# Patient Record
Sex: Female | Born: 1945
Health system: Southern US, Community
[De-identification: ages and names within clinical notes are randomized; demographics above are authoritative.]

## PROBLEM LIST (undated history)

## (undated) DIAGNOSIS — Z87442 Personal history of urinary calculi: Secondary | ICD-10-CM

## (undated) DIAGNOSIS — K219 Gastro-esophageal reflux disease without esophagitis: Secondary | ICD-10-CM

## (undated) DIAGNOSIS — I10 Essential (primary) hypertension: Secondary | ICD-10-CM

## (undated) DIAGNOSIS — E669 Obesity, unspecified: Secondary | ICD-10-CM

## (undated) DIAGNOSIS — E785 Hyperlipidemia, unspecified: Secondary | ICD-10-CM

## (undated) DIAGNOSIS — T7840XA Allergy, unspecified, initial encounter: Secondary | ICD-10-CM

## (undated) HISTORY — DX: Obesity, unspecified: E66.9

## (undated) HISTORY — PX: ABDOMINAL HYSTERECTOMY: SHX81

## (undated) HISTORY — PX: LITHOTRIPSY: SUR834

## (undated) HISTORY — DX: Allergy, unspecified, initial encounter: T78.40XA

## (undated) HISTORY — DX: Hyperlipidemia, unspecified: E78.5

## (undated) HISTORY — DX: Essential (primary) hypertension: I10

## (undated) HISTORY — DX: Gastro-esophageal reflux disease without esophagitis: K21.9

---

## 2002-03-31 ENCOUNTER — Emergency Department (HOSPITAL_COMMUNITY): Admission: EM | Admit: 2002-03-31 | Discharge: 2002-03-31 | Payer: Self-pay

## 2006-02-03 ENCOUNTER — Ambulatory Visit: Payer: Self-pay

## 2006-11-03 LAB — CONVERTED CEMR LAB: Pap Smear: NORMAL

## 2007-04-09 ENCOUNTER — Encounter: Payer: Self-pay | Admitting: Family Medicine

## 2007-10-21 ENCOUNTER — Ambulatory Visit: Payer: Self-pay | Admitting: Family Medicine

## 2007-10-21 DIAGNOSIS — K219 Gastro-esophageal reflux disease without esophagitis: Secondary | ICD-10-CM | POA: Insufficient documentation

## 2007-10-21 DIAGNOSIS — E785 Hyperlipidemia, unspecified: Secondary | ICD-10-CM | POA: Insufficient documentation

## 2007-10-21 DIAGNOSIS — M722 Plantar fascial fibromatosis: Secondary | ICD-10-CM

## 2007-10-21 DIAGNOSIS — J4520 Mild intermittent asthma, uncomplicated: Secondary | ICD-10-CM | POA: Insufficient documentation

## 2007-10-21 DIAGNOSIS — M899 Disorder of bone, unspecified: Secondary | ICD-10-CM

## 2007-10-21 DIAGNOSIS — J309 Allergic rhinitis, unspecified: Secondary | ICD-10-CM | POA: Insufficient documentation

## 2007-10-21 DIAGNOSIS — J4531 Mild persistent asthma with (acute) exacerbation: Secondary | ICD-10-CM

## 2007-10-21 DIAGNOSIS — I10 Essential (primary) hypertension: Secondary | ICD-10-CM

## 2007-10-21 DIAGNOSIS — Z87442 Personal history of urinary calculi: Secondary | ICD-10-CM

## 2007-10-21 DIAGNOSIS — M949 Disorder of cartilage, unspecified: Secondary | ICD-10-CM

## 2007-10-25 ENCOUNTER — Encounter: Payer: Self-pay | Admitting: Family Medicine

## 2007-12-16 ENCOUNTER — Ambulatory Visit: Payer: Self-pay | Admitting: Internal Medicine

## 2007-12-28 ENCOUNTER — Ambulatory Visit: Payer: Self-pay | Admitting: Internal Medicine

## 2007-12-28 DIAGNOSIS — K5730 Diverticulosis of large intestine without perforation or abscess without bleeding: Secondary | ICD-10-CM | POA: Insufficient documentation

## 2007-12-31 ENCOUNTER — Telehealth: Payer: Self-pay | Admitting: Family Medicine

## 2008-01-31 ENCOUNTER — Telehealth: Payer: Self-pay | Admitting: Family Medicine

## 2008-02-17 ENCOUNTER — Ambulatory Visit: Payer: Self-pay | Admitting: Family Medicine

## 2008-02-18 LAB — CONVERTED CEMR LAB
Cholesterol: 246 mg/dL (ref 0–200)
Direct LDL: 187.7 mg/dL
HDL: 43.9 mg/dL (ref >39.0)
Total CHOL/HDL Ratio: 5.6
Triglycerides: 92 mg/dL (ref 0–149)
VLDL: 18 mg/dL (ref 0–40)

## 2008-02-28 ENCOUNTER — Encounter: Payer: Self-pay | Admitting: Family Medicine

## 2008-04-20 ENCOUNTER — Telehealth (INDEPENDENT_AMBULATORY_CARE_PROVIDER_SITE_OTHER): Payer: Self-pay | Admitting: *Deleted

## 2008-04-21 ENCOUNTER — Ambulatory Visit: Payer: Self-pay | Admitting: Family Medicine

## 2008-04-21 LAB — CONVERTED CEMR LAB
ALT: 38 units/L — ABNORMAL HIGH (ref 0–35)
AST: 23 units/L (ref 0–37)
Albumin: 4 g/dL (ref 3.5–5.2)
Alkaline Phosphatase: 39 units/L (ref 39–117)
BUN: 17 mg/dL (ref 6–23)
Bilirubin, Direct: 0.1 mg/dL (ref 0.0–0.3)
CO2: 27 meq/L (ref 19–32)
Calcium: 9.3 mg/dL (ref 8.4–10.5)
Chloride: 108 meq/L (ref 96–112)
Cholesterol: 180 mg/dL (ref 0–200)
Creatinine, Ser: 0.8 mg/dL (ref 0.4–1.2)
GFR calc Af Amer: 94 mL/min
GFR calc non Af Amer: 78 mL/min
Glucose, Bld: 102 mg/dL — ABNORMAL HIGH (ref 70–99)
HDL: 43.8 mg/dL (ref >39.0)
LDL Cholesterol: 120 mg/dL — ABNORMAL HIGH (ref 0–99)
Potassium: 4.2 meq/L (ref 3.5–5.1)
Sodium: 142 meq/L (ref 135–145)
Total Bilirubin: 0.9 mg/dL (ref 0.3–1.2)
Total CHOL/HDL Ratio: 4.1
Total Protein: 6.7 g/dL (ref 6.0–8.3)
Triglycerides: 83 mg/dL (ref 0–149)
VLDL: 17 mg/dL (ref 0–40)

## 2008-04-24 ENCOUNTER — Ambulatory Visit: Payer: Self-pay | Admitting: Family Medicine

## 2008-06-22 ENCOUNTER — Ambulatory Visit: Payer: Self-pay | Admitting: Family Medicine

## 2008-06-22 DIAGNOSIS — IMO0001 Reserved for inherently not codable concepts without codable children: Secondary | ICD-10-CM

## 2008-07-11 ENCOUNTER — Ambulatory Visit: Payer: Self-pay | Admitting: Family Medicine

## 2008-08-01 ENCOUNTER — Ambulatory Visit: Payer: Self-pay | Admitting: Family Medicine

## 2008-08-01 DIAGNOSIS — R109 Unspecified abdominal pain: Secondary | ICD-10-CM | POA: Insufficient documentation

## 2008-08-01 DIAGNOSIS — L658 Other specified nonscarring hair loss: Secondary | ICD-10-CM | POA: Insufficient documentation

## 2008-08-03 ENCOUNTER — Encounter: Payer: Self-pay | Admitting: Family Medicine

## 2008-08-07 ENCOUNTER — Encounter (INDEPENDENT_AMBULATORY_CARE_PROVIDER_SITE_OTHER): Payer: Self-pay | Admitting: *Deleted

## 2008-08-07 LAB — CONVERTED CEMR LAB
Free T4: 1.3 ng/dL (ref 0.6–1.6)
T4, Total: 7.4 ug/dL (ref 5.0–12.5)
TSH: 0.69 microintl units/mL (ref 0.35–5.50)

## 2008-08-31 ENCOUNTER — Ambulatory Visit: Payer: Self-pay | Admitting: Family Medicine

## 2008-09-04 ENCOUNTER — Encounter: Payer: Self-pay | Admitting: Family Medicine

## 2008-09-05 ENCOUNTER — Encounter: Payer: Self-pay | Admitting: Family Medicine

## 2008-09-27 ENCOUNTER — Encounter (INDEPENDENT_AMBULATORY_CARE_PROVIDER_SITE_OTHER): Payer: Self-pay | Admitting: *Deleted

## 2008-10-17 ENCOUNTER — Ambulatory Visit: Payer: Self-pay | Admitting: Family Medicine

## 2008-10-17 LAB — CONVERTED CEMR LAB
Cholesterol: 213 mg/dL — ABNORMAL HIGH (ref 0–200)
Direct LDL: 148.7 mg/dL
HDL: 43 mg/dL (ref >39.00)
Total CHOL/HDL Ratio: 5
Triglycerides: 145 mg/dL (ref 0.0–149.0)
VLDL: 29 mg/dL (ref 0.0–40.0)

## 2008-10-19 ENCOUNTER — Ambulatory Visit: Payer: Self-pay | Admitting: Family Medicine

## 2009-04-04 LAB — CONVERTED CEMR LAB: Pap Smear: NORMAL

## 2009-05-31 ENCOUNTER — Encounter: Payer: Self-pay | Admitting: Family Medicine

## 2009-06-11 ENCOUNTER — Ambulatory Visit: Payer: Self-pay | Admitting: Family Medicine

## 2009-06-11 LAB — CONVERTED CEMR LAB
ALT: 19 units/L (ref 0–35)
AST: 19 units/L (ref 0–37)
Albumin: 4.1 g/dL (ref 3.5–5.2)
Alkaline Phosphatase: 53 units/L (ref 39–117)
Bilirubin, Direct: 0.1 mg/dL (ref 0.0–0.3)
Cholesterol: 242 mg/dL — ABNORMAL HIGH (ref 0–200)
Direct LDL: 181 mg/dL
HDL: 51.1 mg/dL (ref >39.00)
Total Bilirubin: 0.8 mg/dL (ref 0.3–1.2)
Total CHOL/HDL Ratio: 5
Total Protein: 6.6 g/dL (ref 6.0–8.3)
Triglycerides: 138 mg/dL (ref 0.0–149.0)
VLDL: 27.6 mg/dL (ref 0.0–40.0)

## 2009-06-12 ENCOUNTER — Ambulatory Visit: Payer: Self-pay | Admitting: Family Medicine

## 2009-06-12 DIAGNOSIS — D751 Secondary polycythemia: Secondary | ICD-10-CM

## 2009-06-12 LAB — CONVERTED CEMR LAB
Cholesterol, target level: 200 mg/dL
Ferritin: 132.9 ng/mL (ref 10.0–291.0)
HDL goal, serum: 40 mg/dL
LDL Goal: 130 mg/dL

## 2009-06-13 LAB — CONVERTED CEMR LAB
Basophils Absolute: 0.1 10*3/uL (ref 0.0–0.1)
Eosinophils Relative: 1.8 % (ref 0.0–5.0)
HCT: 44.4 % (ref 36.0–46.0)
Hemoglobin: 15 g/dL (ref 12.0–15.0)
Lymphs Abs: 3.6 10*3/uL (ref 0.7–4.0)
MCV: 85.8 fL (ref 78.0–100.0)
Monocytes Absolute: 0.8 10*3/uL (ref 0.1–1.0)
Monocytes Relative: 8.7 % (ref 3.0–12.0)
Neutro Abs: 4 10*3/uL (ref 1.4–7.7)
Platelets: 332 10*3/uL (ref 150.0–400.0)
RDW: 12.5 % (ref 11.5–14.6)

## 2009-06-20 ENCOUNTER — Ambulatory Visit: Payer: Self-pay | Admitting: Family Medicine

## 2009-06-20 LAB — CONVERTED CEMR LAB
Specific Gravity, Urine: 1.01
pH: 6

## 2009-06-21 ENCOUNTER — Encounter (INDEPENDENT_AMBULATORY_CARE_PROVIDER_SITE_OTHER): Payer: Self-pay | Admitting: Internal Medicine

## 2009-12-20 ENCOUNTER — Ambulatory Visit: Payer: Self-pay | Admitting: Orthopedic Surgery

## 2010-05-27 ENCOUNTER — Telehealth (INDEPENDENT_AMBULATORY_CARE_PROVIDER_SITE_OTHER): Payer: Self-pay | Admitting: *Deleted

## 2010-05-29 ENCOUNTER — Ambulatory Visit: Payer: Self-pay | Admitting: Family Medicine

## 2010-05-29 DIAGNOSIS — R5381 Other malaise: Secondary | ICD-10-CM | POA: Insufficient documentation

## 2010-05-29 DIAGNOSIS — R5383 Other fatigue: Secondary | ICD-10-CM

## 2010-05-29 LAB — CONVERTED CEMR LAB
ALT: 21 units/L (ref 0–35)
AST: 19 units/L (ref 0–37)
Alkaline Phosphatase: 55 units/L (ref 39–117)
Basophils Relative: 0.8 % (ref 0.0–3.0)
Bilirubin, Direct: 0.1 mg/dL (ref 0.0–0.3)
CO2: 25 meq/L (ref 19–32)
Chloride: 100 meq/L (ref 96–112)
Eosinophils Relative: 1 % (ref 0.0–5.0)
HCT: 45.9 % (ref 36.0–46.0)
Hemoglobin: 15.2 g/dL — ABNORMAL HIGH (ref 12.0–15.0)
Lymphs Abs: 3.7 10*3/uL (ref 0.7–4.0)
Monocytes Relative: 7.4 % (ref 3.0–12.0)
Platelets: 331 10*3/uL (ref 150.0–400.0)
Potassium: 4.3 meq/L (ref 3.5–5.1)
RBC: 5.38 M/uL — ABNORMAL HIGH (ref 3.87–5.11)
TSH: 0.79 microintl units/mL (ref 0.35–5.50)
Total Bilirubin: 0.5 mg/dL (ref 0.3–1.2)
Total CHOL/HDL Ratio: 5
Triglycerides: 138 mg/dL (ref 0.0–149.0)
WBC: 9.7 10*3/uL (ref 4.5–10.5)

## 2010-06-05 ENCOUNTER — Ambulatory Visit: Payer: Self-pay | Admitting: Family Medicine

## 2010-07-09 ENCOUNTER — Telehealth: Payer: Self-pay | Admitting: Family Medicine

## 2010-07-12 ENCOUNTER — Telehealth: Payer: Self-pay | Admitting: Family Medicine

## 2010-07-31 ENCOUNTER — Encounter (INDEPENDENT_AMBULATORY_CARE_PROVIDER_SITE_OTHER): Payer: Self-pay | Admitting: *Deleted

## 2010-07-31 ENCOUNTER — Ambulatory Visit: Payer: Self-pay | Admitting: Family Medicine

## 2010-08-06 ENCOUNTER — Ambulatory Visit
Admission: RE | Admit: 2010-08-06 | Discharge: 2010-08-06 | Payer: Self-pay | Source: Home / Self Care | Attending: Internal Medicine | Admitting: Internal Medicine

## 2010-08-08 ENCOUNTER — Ambulatory Visit: Admit: 2010-08-08 | Payer: Self-pay | Admitting: Family Medicine

## 2010-09-03 NOTE — Progress Notes (Signed)
----   Converted from flag ---- ---- 05/26/2010 3:46 PM, Hannah Beat MD wrote: Prephysical Labs, several days before, fasting BMP, HFP, FLP, CBC with diff, TSH: 272.4, v77.1, ,780.79  ---- 05/23/2010 9:56 AM, Liane Comber CMA (AAMA) wrote: Lab orders please! Good Morning! This pt is scheduled for cpx labs Wed, which labs to draw and dx codes to use? Thanks Tasha ------------------------------

## 2010-09-03 NOTE — Progress Notes (Signed)
Summary: finasteride  Phone Note Refill Request Message from:  Fax from Pharmacy on July 09, 2010 1:08 PM  Refills Requested: Medication #1:  FINASTERIDE 5 MG TABS 1/4 tab by mouth daily   Last Refilled: 06/05/2010 Refill request from [facility]. [phone].   Initial call taken by: Melody Comas,  July 09, 2010 1:08 PM  Follow-up for Phone Call        Rx faxed to pharmacy Follow-up by: Benny Lennert CMA Duncan Dull),  July 09, 2010 1:30 PM    Prescriptions: FINASTERIDE 5 MG TABS (FINASTERIDE) 1/4 tab by mouth daily  #90 x 2   Entered by:   Benny Lennert CMA (AAMA)   Authorized by:   Kerby Nora MD   Signed by:   Benny Lennert CMA (AAMA) on 07/09/2010   Method used:   Faxed to ...       [facility] (mail-order)             , [place]         Ph: [phone]       Fax: [phone]   RxID:   [id number]

## 2010-09-03 NOTE — Assessment & Plan Note (Signed)
Summary: CPX/JRR   Vital Signs:  Patient profile:   65 year old female Height:      61.5 inches Weight:      170.0 pounds BMI:     31.72 Temp:     98.6 degrees F oral Pulse rate:   76 / minute Pulse rhythm:   regular BP sitting:   140 / 98  (left arm) Cuff size:   regular  Vitals Entered By: Benny Lennert CMA Duncan Dull) (June 05, 2010 2:39 PM)  History of Present Illness: Chief complaint cpx  65 year old for CPX:    Hypertension History:      She denies headache, chest pain, palpitations, dyspnea with exertion, orthopnea, PND, peripheral edema, visual symptoms, neurologic problems, syncope, and side effects from treatment.        Positive major cardiovascular risk factors include female age 88 years old or older, hyperlipidemia, and hypertension.  Negative major cardiovascular risk factors include no history of diabetes and non-tobacco-user status.        Further assessment for target organ damage reveals no history of ASHD, stroke/TIA, or peripheral vascular disease.    Lipid Management History:      Positive NCEP/ATP III risk factors include female age 96 years old or older and hypertension.  Negative NCEP/ATP III risk factors include no history of early menopause without estrogen hormone replacement, non-diabetic, non-tobacco-user status, no ASHD (atherosclerotic heart disease), no prior stroke/TIA, no peripheral vascular disease, and no history of aortic aneurysm.        Her compliance with the TLC diet is fair.    Preventive Screening-Counseling & Management  Alcohol-Tobacco     Alcohol Counseling: not indicated; use of alcohol is not excessive or problematic     Smoking Status: never  Caffeine-Diet-Exercise     Does Patient Exercise: yes     Type of exercise: walking     Exercise (avg: min/session): 30-60     Times/week: 4  Hep-HIV-STD-Contraception     HIV Risk: no risk noted     STD Risk: no risk noted     Contraception Counseling: not indicated; no  questions/concerns expressed     SBE Education/Counseling: to perform regular SBE      Sexual History:  currently monogamous.        Drug Use:  never.    Allergies: 1)  ! Codeine  Past History:  Past medical, surgical, family and social histories (including risk factors) reviewed, and no changes noted (except as noted below).  Past Medical History: Reviewed history from 10/21/2007 and no changes required. Asthma, mild intermittant GERD Allergic rhinitis Hypertension Hyperlipidemia  Past Surgical History: Reviewed history from 10/21/2007 and no changes required. lithotripsy, 2 x 1990s 1979 hysterectomy, vaginal partial for fibriods and mennorhagia  Family History: Reviewed history from 10/21/2007 and no changes required. father: aciidental death mother: CAD dx age 1, DM siblings: HTN, high chol, brother with CAD, MI age 28 ? grandparent history  Social History: Reviewed history from 10/21/2007 and no changes required. Occupation:  works at [employer], Production manager Married 2 children, healthy Never Smoked Alcohol use-yes, seldom Drug use-no Regular exercise-yes, walks 3-4 days a week Diet: no fast food, instead veggies, loves baking  HIV Risk:  no risk noted STD Risk:  no risk noted Sexual History:  currently monogamous Drug Use:  never  Review of Systems  General: Denies fever, chills, sweats, anorexia, fatigue, weakness, malaise Eyes: Denies blurring, vision loss ENT: Denies earache, nasal congestion, nosebleeds, sore throat, and  hoarseness.  Cardiovascular: Denies chest pains, palpitations, syncope, dyspnea on exertion,  Respiratory: Denies cough, dyspnea at rest, excessive sputum,wheeezing GI: Denies nausea, vomiting, diarrhea, constipation, change in bowel habits, abdominal pain, melena, hematochezia GU: Denies dysuria, hematuria, discharge, urinary frequency, urinary hesitancy, nocturia, incontinence, genital sores, decreased libido Musculoskeletal: Denies back pain,  joint pain Derm: Denies rash, itching Neuro: Denies  paresthesias, frequent falls, frequent headaches, and difficulty walking.  Psych: Denies depression, anxiety Endocrine: Denies cold intolerance, heat intolerance, polydipsia, polyphagia, polyuria, and unusual weight change.  Heme: Denies enlarged lymph nodes Allergy: No hayfever   Otherwise, the pertinent positives and negatives are listed above and in the HPI, otherwise a full review of systems has been reviewed and is negative unless noted positive.    Impression & Recommendations:  Problem # 1:  HEALTH MAINTENANCE EXAM (ICD-V70.0) The patient's preventative maintenance and recommended screening tests for an annual wellness exam were reviewed in full today. Brought up to date unless services declined.  Counselled on the importance of diet, exercise, and its role in overall health and mortality. The patient's FH and SH was reviewed, including their home life, tobacco status, and drug and alcohol status.   Problem # 2:  HYPERTENSION (ICD-401.9) Assessment: Deteriorated  Her updated medication list for this problem includes:    Hydrochlorothiazide 12.5 Mg Tabs (Hydrochlorothiazide) .Marland Kitchen... 1 by mouth daily  Problem # 3:  HYPERLIPIDEMIA (ICD-272.4)  Her updated medication list for this problem includes:    Simvastatin 40 Mg Tabs (Simvastatin) .Marland Kitchen... Take one tablet at bedtime  Complete Medication List: 1)  Multivitamins Tabs (Multiple vitamin) .... Take 1 tablet by mouth once a day 2)  Viactiv 500-100-40 Chew (Calcium-vitamin d-vitamin k) .... Chew 1 each day 3)  Albuterol 90 Mcg/act Aers (Albuterol) .... As needed 4)  Fluticasone Propionate 50 Mcg/act Susp (Fluticasone propionate) .... 2 sprays per nostril daily (90 day supply) as needed 5)  Finasteride 5 Mg Tabs (Finasteride) .... 1/4 tab by mouth daily 6)  Fish Oil 300 Mg Caps (Omega-3 fatty acids) .... Daily 7)  Flax Seed Oil 1000 Mg Caps (Flaxseed (linseed)) .... Once  daily 8)  Biotin 5 Mg Caps (Biotin) .... Once daily 9)  Vivelle-dot 0.075 Mg/24hr Pttw (Estradiol) .... Once daily 10)  Azo Standard Maximum Strength 97.5 Mg Tabs (Phenazopyridine hcl) .... Otc as directed. 11)  Hydrochlorothiazide 12.5 Mg Tabs (Hydrochlorothiazide) .Marland Kitchen.. 1 by mouth daily 12)  Simvastatin 40 Mg Tabs (Simvastatin) .... Take one tablet at bedtime  Other Orders: Admin 1st Vaccine ([address]) Flu Vaccine 23yrs + ([address])  Hypertension Assessment/Plan:      The patient's hypertensive risk group is category B: At least one risk factor (excluding diabetes) with no target organ damage.  Her calculated 10 year risk of coronary heart disease is 13 %.  Today's blood pressure is 140/98.    Lipid Assessment/Plan:      Based on NCEP/ATP III, the patient's risk factor category is "2 or more risk factors and a calculated 10 year CAD risk of < 20%".  From this information, the patient's calculated lipid goals are as follows: Total cholesterol goal is 200; LDL cholesterol goal is 130; HDL cholesterol goal is 40; Triglyceride goal is 150.  Her LDL cholesterol goal has not been met.    Patient Instructions: 1)  recheck 3 mo: 2)  FLP, BMP, HFP: 272.4, v58.69 3)  sev days prior to ov Prescriptions: FINASTERIDE 5 MG TABS (FINASTERIDE) 1/4 tab by mouth daily  #30 x 3  Entered and Authorized by:   Hannah Beat MD   Signed by:   Hannah Beat MD on 06/05/2010   Method used:   Print then Give to Patient   RxID:   [id number] SIMVASTATIN 40 MG TABS (SIMVASTATIN) Take one tablet at bedtime  #90 x 3   Entered and Authorized by:   Hannah Beat MD   Signed by:   Hannah Beat MD on 06/05/2010   Method used:   Print then Give to Patient   RxID:   [id number] HYDROCHLOROTHIAZIDE 12.5 MG TABS (HYDROCHLOROTHIAZIDE) 1 by mouth daily  #90 x 3   Entered and Authorized by:   Hannah Beat MD   Signed by:   Hannah Beat MD on 06/05/2010   Method used:   Print then Give to Patient    RxID:   [id number] SIMVASTATIN 40 MG TABS (SIMVASTATIN) Take one tablet at bedtime  #30 x 11   Entered and Authorized by:   Hannah Beat MD   Signed by:   Hannah Beat MD on 06/05/2010   Method used:   Print then Give to Patient   RxID:   [id number] HYDROCHLOROTHIAZIDE 12.5 MG TABS (HYDROCHLOROTHIAZIDE) 1 by mouth daily  #30 x 11   Entered and Authorized by:   Hannah Beat MD   Signed by:   Hannah Beat MD on 06/05/2010   Method used:   Print then Give to Patient   RxID:   [id number] FINASTERIDE 5 MG TABS (FINASTERIDE) 1/4 tab by mouth daily  #8 x 11   Entered and Authorized by:   Hannah Beat MD   Signed by:   Hannah Beat MD on 06/05/2010   Method used:   Print then Give to Patient   RxID:   [id number]    Orders Added: 1)  Admin 1st Vaccine [90471] 2)  Flu Vaccine 29yrs + [09323] 3)  Est. Patient 40-64 years [55732]    Current Allergies (reviewed today): ! CODEINE      Flu Vaccine Consent Questions     Do you have a history of severe allergic reactions to this vaccine? no    Any prior history of allergic reactions to egg and/or gelatin? no    Do you have a sensitivity to the preservative Thimersol? no    Do you have a past history of Guillan-Barre Syndrome? no    Do you currently have an acute febrile illness? no    Have you ever had a severe reaction to latex? no    Vaccine information given and explained to patient? yes    Are you currently pregnant? no    Lot Number:[id number]   Exp Date:02/01/2011   Site Given  Left Deltoid IM   .lbflu    Prevention & Chronic Care Immunizations   Influenza vaccine: Fluvax 3+  (06/05/2010)   Influenza vaccine due: 06/12/2010    Tetanus booster: Not documented    Pneumococcal vaccine: Not documented    H. zoster vaccine: 07/11/2008: Zostavax  Colorectal Screening   Hemoccult: Not documented    Colonoscopy: Location:  [facility].    (12/28/2007)   Colonoscopy  due: 01/2018  Other Screening   Pap smear: normal  (04/04/2009)   Pap smear action/deferral: GYN referral  (06/05/2010)   Pap smear due: 03/05/2011    Mammogram: normal  (04/04/2009)   Mammogram action/deferral: Ordered  (06/05/2010)   Mammogram due: 03/05/2011    DXA bone density scan: Not documented   Smoking status: never  (06/05/2010)  Lipids  Total Cholesterol: 242  (05/29/2010)   LDL: 120  (04/21/2008)   LDL Direct: 162.6  (05/29/2010)   HDL: 50.60  (05/29/2010)   Triglycerides: 138.0  (05/29/2010)    SGOT (AST): 19  (05/29/2010)   SGPT (ALT): 21  (05/29/2010)   Alkaline phosphatase: 55  (05/29/2010)   Total bilirubin: 0.5  (05/29/2010)    Lipid flowsheet reviewed?: Yes   Progress toward LDL goal: Deteriorated  Hypertension   Last Blood Pressure: 140 / 98  (06/05/2010)   Serum creatinine: 0.7  (05/29/2010)   Serum potassium 4.3  (05/29/2010)    Hypertension flowsheet reviewed?: Yes   Progress toward BP goal: Deteriorated  Self-Management Support :    Hypertension self-management support: Not documented    Lipid self-management support: Not documented

## 2010-09-05 NOTE — Letter (Signed)
Summary: [place] No Show Letter  [place] [facility]  [address] [place], [place] [address]   Phone: [phone]  Fax: [phone]    07/31/2010 MRN: [id number]  Dana Hayes [address] [place], [place]  [address]   Dear Ms. Madej,   Our records indicate that you missed your scheduled appointment with ___lab_________________ on _12.28.2011__________.  Please contact this office to reschedule your appointment as soon as possible.  It is important that you keep your scheduled appointments with your physician, so we can provide you the best care possible.  Please be advised that there may be a charge for "no show" appointments.    Sincerely,   [place] [facility]

## 2010-09-05 NOTE — Assessment & Plan Note (Signed)
Summary: SINUS SYMPTOMS/ 2:15   Vital Signs:  Patient profile:   65 year old female Weight:      173.50 pounds Temp:     98.5 degrees F oral Pulse rate:   64 / minute Pulse rhythm:   regular BP sitting:   138 / 82  (left arm) Cuff size:   large  Vitals Entered By: Selena Batten Dance CMA (AAMA) (August 06, 2010 2:14 PM) CC: Sinus symptoms/check skin on abd.   History of Present Illness: CC: sinus/cold/cough  3d h/o pressure in head, started with cold sxs (clear runny nose, itchy eyes)  zyrtec didn't help.  Progressed to congestion now with worse cough.  + fatigue.  also with bad HA and pressure in ears.  taking tylenol am and pm, using ventolin inhaler.  mild ST and dry throat.  + myalgias.  has felt hot.    + haarseness but now better.    No abd pain, n/v/d, fevers/chills.  No purulent nasal discharge.  No smokers at home.  h/o mild asthma.  + grandchildren all sick with colds.  on vivelle, rash around site.  Current Medications (verified): 1)  Multivitamins   Tabs (Multiple Vitamin) .... Take 1 Tablet By Mouth Once A Day 2)  Viactiv 500-100-40  Chew (Calcium-Vitamin D-Vitamin K) .... Chew 1 Each Day 3)  Albuterol 90 Mcg/act  Aers (Albuterol) .... As Needed 4)  Fluticasone Propionate 50 Mcg/act Susp (Fluticasone Propionate) .... 2 Sprays Per Nostril Daily (90 Day Supply) As Needed 5)  Finasteride 5 Mg Tabs (Finasteride) .... 1/4 Tab By Mouth Daily 6)  Fish Oil 300 Mg Caps (Omega-3 Fatty Acids) .... Daily 7)  Flax Seed Oil 1000 Mg Caps (Flaxseed (Linseed)) .... Once Daily 8)  Biotin 5 Mg Caps (Biotin) .... Once Daily 9)  Vivelle-Dot 0.075 Mg/24hr Pttw (Estradiol) .... Once Daily 10)  Azo Standard Maximum Strength 97.5 Mg Tabs (Phenazopyridine Hcl) .... Otc As Directed. 11)  Hydrochlorothiazide 12.5 Mg Tabs (Hydrochlorothiazide) .Marland Kitchen.. 1 By Mouth Daily 12)  Simvastatin 40 Mg Tabs (Simvastatin) .... Take One Tablet At Bedtime  Allergies: 1)  ! 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 nontoxic Head:  Normocephalic and atraumatic without obvious abnormalities. No apparent alopecia or balding.  no sinus tenderness Eyes:  PERRLA, EOMI Ears:  TMs clear bilaterally Nose:  nares clear Mouth:  MMM, no pharyngeal erythema/exudates Neck:  No deformities, masses, or tenderness noted.  no LAD Lungs:  Normal respiratory effort, chest expands symmetrically. Lungs are clear to auscultation, no crackles or wheezes. Heart:  Normal rate and regular rhythm. S1 and S2 normal without gallop, murmur, click, rub or other extra sounds. Pulses:  2+ rad pulses, brisk cap refill Extremities:  no pedal edema Skin:  erythematous papulopustular rash over sites of previous rashes.   Impression & Recommendations:  Problem # 1:  URI (ICD-465.9) Assessment New supportive care.  red flags to return discussed. lungs clear, no asthma flare.  Problem # 2:  SKIN RASH, ALLERGIC (ICD-692.9) Assessment: New looks like  allergic reaction to vivelle.  advised to stop and check with PCP for other options.  Complete Medication List: 1)  Multivitamins Tabs (Multiple vitamin) .... Take 1 tablet by mouth once a day 2)  Viactiv 500-100-40 Chew (Calcium-vitamin d-vitamin k) .... Chew 1 each day 3)  Albuterol 90 Mcg/act Aers (Albuterol) .... As needed 4)  Fluticasone Propionate 50 Mcg/act Susp (Fluticasone propionate) .... 2 sprays per nostril daily (90  day supply) as needed 5)  Finasteride 5 Mg Tabs (Finasteride) .... 1/4 tab by mouth daily 6)  Fish Oil 300 Mg Caps (Omega-3 fatty acids) .... Daily 7)  Flax Seed Oil 1000 Mg Caps (Flaxseed (linseed)) .... Once daily 8)  Biotin 5 Mg Caps (Biotin) .... Once daily 9)  Azo Standard Maximum Strength 97.5 Mg Tabs (Phenazopyridine hcl) .... Otc as directed. 10)  Hydrochlorothiazide 12.5 Mg Tabs (Hydrochlorothiazide) .Marland Kitchen.. 1 by mouth daily 11)  Simvastatin 40 Mg Tabs (Simvastatin) .... Take one tablet at bedtime  Patient Instructions: 1)  Sounds like you have a viral upper respiratory infection. 2)  Antibiotics are not needed for this.  Viral infections usually take 7-10 days to resolve.  The cough can last 4 weeks to go away. 3)  use delsym.  use nasal saline spray. 4)  Simple mucinex to mobilize mucous out wiht plenty of water. 5)  Push fluids and plenty of rest. 6)  If not better by end of week, give [place] a call. 7)  Please return if you are not improving as expected, or if you have high fevers (>101.5) or difficulty swallowing. 8)  Call clinic with questions.  Pleasure to see you today!    Orders Added: 1)  Est. Patient Level III [91478]    Current Allergies (reviewed today): ! CODEINE * VIVELLE

## 2010-09-05 NOTE — Progress Notes (Signed)
Summary: regarding finasteride  Phone Note From Pharmacy   Caller: [facility]  Reference [id number] Request: Speak with Nurse Summary of Call: Pharmacist states finasteride will not be covered by [facility] for alopecia.  They are saying you can change to propecia at a higher mg and it will be.  I called the pt to see where she has been getting the finasteride filled and how she has been paying for it.  LMOM for her to call me back.           Lowella Petties CMA, AAMA  July 12, 2010 11:55 AM Spoke with pt and advised her that insurance will not cover her dose of finasteride for her diagnosis.  I asked her to check with her insurance and find out what is going on.  She says she has been getting this filled at [facility] and her insurance has been paying. Initial call taken by: Lowella Petties CMA, AAMA,  July 17, 2010 2:33 PM  Follow-up for Phone Call        Pt states she will keep this script at [facility] and will pay for it if necessary. Follow-up by: Lowella Petties CMA, AAMA,  July 19, 2010 12:47 PM

## 2010-11-07 ENCOUNTER — Other Ambulatory Visit: Payer: Self-pay

## 2010-11-07 NOTE — Telephone Encounter (Signed)
Received faxed refill request from [facility] for Finasteride. Pt received rx for 1 yr in 06/2010. I called pt and pt said to disregard the fax pt is getting med at another pharmacy.

## 2010-11-15 ENCOUNTER — Other Ambulatory Visit: Payer: Self-pay | Admitting: *Deleted

## 2010-11-15 MED ORDER — FINASTERIDE 5 MG PO TABS: 5.0000 mg | ORAL_TABLET | Freq: Every day | ORAL | Status: DC

## 2011-01-06 ENCOUNTER — Ambulatory Visit: Payer: Self-pay | Admitting: Internal Medicine

## 2011-01-06 ENCOUNTER — Encounter: Payer: Self-pay | Admitting: Family Medicine

## 2011-01-06 ENCOUNTER — Ambulatory Visit (INDEPENDENT_AMBULATORY_CARE_PROVIDER_SITE_OTHER): Payer: 59 | Admitting: Family Medicine

## 2011-01-06 VITALS — BP 140/82 | HR 92 | Temp 98.0°F | Ht 61.5 in | Wt 168.8 lb

## 2011-01-06 DIAGNOSIS — N390 Urinary tract infection, site not specified: Secondary | ICD-10-CM

## 2011-01-06 DIAGNOSIS — R300 Dysuria: Secondary | ICD-10-CM

## 2011-01-06 LAB — POCT URINALYSIS DIPSTICK
Bilirubin, UA: NEGATIVE
Blood, UA: NEGATIVE
Glucose, UA: NEGATIVE
Ketones, UA: NEGATIVE
Nitrite, UA: NEGATIVE
Spec Grav, UA: 1.01

## 2011-01-06 MED ORDER — NITROFURANTOIN MONOHYD MACRO 100 MG PO CAPS: 100.0000 mg | ORAL_CAPSULE | Freq: Two times a day (BID) | ORAL | Status: AC

## 2011-01-06 NOTE — Progress Notes (Signed)
65 year old female:  Has been having some symptoms since the 14th. Has been taking some AZO.  Sometimes will hurt with urination. And ibuprofen. Has some urgency.   Patient presents with burning, urgency. No vaginal discharge or external irritation.  No abd pain, no flank pain.  ROS: GEN:  no fevers, chills. GI: No n/v/d, eating normally Otherwise, ROS is as per the HPI.  PHYSICAL EXAM  Blood pressure 140/82, pulse 92, temperature 98 F (36.7 C), temperature source Oral, height 5' 1.5" (1.562 m), weight 168 lb 12.8 oz (76.567 kg), SpO2 96.00%.  GEN: WDWN, A&Ox4,NAD. Non-toxic HEENT: Atraumatc, normocephalic. CV: RRR, No M/G/R PULM: CTA B, No wheezes, crackles, or rhonchi ABD: S, NT, ND, +BS, no rebound. No CVAT. + suprapubic tenderness. EXT: No c/c/e  A/P: UTI. Rx with ABX as below

## 2011-01-08 LAB — URINE CULTURE

## 2011-01-09 ENCOUNTER — Telehealth: Payer: Self-pay

## 2011-01-09 NOTE — Telephone Encounter (Signed)
Left v/m on pt's home and work # to call back.

## 2011-01-09 NOTE — Telephone Encounter (Signed)
Message copied by Patience Musca on Thu Jan 09, 2011  2:23 PM ------      Message from: Hannah Beat      Created: Wed Jan 08, 2011  5:09 PM       Call            See if she is feeling better, would still finish all ABX      Urine looks sterile - bottom line, let me know if she still feels the same symptoms next week       Could be something else, not urine infection

## 2011-01-09 NOTE — Telephone Encounter (Signed)
Patient notified as instructed by telephone.  Pt states she feels better but if not taking Azo still has slight burning when urinates and urgency feeling. Pt will finish antibiotic and pt will call back next week with update of symptoms.

## 2011-01-09 NOTE — Telephone Encounter (Signed)
Message copied by Patience Musca on Thu Jan 09, 2011  1:55 PM ------      Message from: Hannah Beat      Created: Wed Jan 08, 2011  5:09 PM       Call            See if she is feeling better, would still finish all ABX      Urine looks sterile - bottom line, let me know if she still feels the same symptoms next week       Could be something else, not urine infection

## 2011-01-13 ENCOUNTER — Telehealth: Payer: Self-pay | Admitting: *Deleted

## 2011-01-13 DIAGNOSIS — R300 Dysuria: Secondary | ICD-10-CM

## 2011-01-13 NOTE — Telephone Encounter (Signed)
Patient says that if you do want to do referral to urologist she would like to see Dr. Artis Flock.

## 2011-01-13 NOTE — Telephone Encounter (Signed)
THat is what I would suggest -- I am not sure why she is still having symptoms

## 2011-01-13 NOTE — Telephone Encounter (Signed)
Patient says that she has been taking the antibiotic, but is still having all of the same symptoms as last week and they haven't improved at all. She is asking if she should be referred to a urologist or what you suggest at this point. Please advise.

## 2011-01-13 NOTE — Telephone Encounter (Signed)
Patient notified, will wait to hear from [facility].

## 2011-01-14 NOTE — Telephone Encounter (Signed)
Appt made with Dr Artis Flock on 01/15/2011. MK

## 2011-06-02 ENCOUNTER — Other Ambulatory Visit: Payer: 59

## 2011-06-03 ENCOUNTER — Other Ambulatory Visit (INDEPENDENT_AMBULATORY_CARE_PROVIDER_SITE_OTHER): Payer: 59

## 2011-06-03 DIAGNOSIS — Z79899 Other long term (current) drug therapy: Secondary | ICD-10-CM

## 2011-06-03 DIAGNOSIS — R5383 Other fatigue: Secondary | ICD-10-CM

## 2011-06-03 DIAGNOSIS — E785 Hyperlipidemia, unspecified: Secondary | ICD-10-CM

## 2011-06-03 DIAGNOSIS — R5381 Other malaise: Secondary | ICD-10-CM

## 2011-06-03 LAB — CBC WITH DIFFERENTIAL/PLATELET
Basophils Relative: 0.8 % (ref 0.0–3.0)
Eosinophils Absolute: 0.1 10*3/uL (ref 0.0–0.7)
Eosinophils Relative: 1.8 % (ref 0.0–5.0)
HCT: 44.8 % (ref 36.0–46.0)
Lymphs Abs: 3.2 10*3/uL (ref 0.7–4.0)
MCHC: 33 g/dL (ref 30.0–36.0)
MCV: 84.7 fl (ref 78.0–100.0)
Monocytes Absolute: 0.6 10*3/uL (ref 0.1–1.0)
Neutrophils Relative %: 51.8 % (ref 43.0–77.0)
Platelets: 360 10*3/uL (ref 150.0–400.0)

## 2011-06-03 LAB — BASIC METABOLIC PANEL
BUN: 19 mg/dL (ref 6–23)
Chloride: 106 mEq/L (ref 96–112)
GFR: 86.82 mL/min (ref >60.00)
Potassium: 4.6 mEq/L (ref 3.5–5.1)
Sodium: 140 mEq/L (ref 135–145)

## 2011-06-03 LAB — HEPATIC FUNCTION PANEL
ALT: 21 U/L (ref 0–35)
Bilirubin, Direct: 0 mg/dL (ref 0.0–0.3)
Total Bilirubin: 0.5 mg/dL (ref 0.3–1.2)
Total Protein: 6.8 g/dL (ref 6.0–8.3)

## 2011-06-03 LAB — LIPID PANEL
Cholesterol: 196 mg/dL (ref 0–200)
HDL: 61.3 mg/dL (ref >39.00)
VLDL: 24.4 mg/dL (ref 0.0–40.0)

## 2011-06-03 LAB — TSH: TSH: 0.77 u[IU]/mL (ref 0.35–5.50)

## 2011-06-09 ENCOUNTER — Ambulatory Visit (INDEPENDENT_AMBULATORY_CARE_PROVIDER_SITE_OTHER): Payer: 59 | Admitting: Family Medicine

## 2011-06-09 ENCOUNTER — Encounter: Payer: Self-pay | Admitting: Family Medicine

## 2011-06-09 VITALS — BP 148/80 | HR 83 | Temp 98.2°F | Ht 61.0 in | Wt 168.0 lb

## 2011-06-09 DIAGNOSIS — Z23 Encounter for immunization: Secondary | ICD-10-CM

## 2011-06-09 DIAGNOSIS — Z0000 Encounter for general adult medical examination without abnormal findings: Secondary | ICD-10-CM

## 2011-06-09 DIAGNOSIS — I10 Essential (primary) hypertension: Secondary | ICD-10-CM

## 2011-06-09 MED ORDER — HYDROCHLOROTHIAZIDE 12.5 MG PO TABS: 12.5000 mg | ORAL_TABLET | Freq: Every day | ORAL | Status: DC

## 2011-06-09 MED ORDER — FINASTERIDE 5 MG PO TABS: 5.0000 mg | ORAL_TABLET | Freq: Every day | ORAL | Status: AC

## 2011-06-09 MED ORDER — ALBUTEROL 90 MCG/ACT IN AERS: 2.0000 | INHALATION_SPRAY | Freq: Four times a day (QID) | RESPIRATORY_TRACT | Status: DC | PRN

## 2011-06-09 MED ORDER — SIMVASTATIN 40 MG PO TABS: 40.0000 mg | ORAL_TABLET | Freq: Every day | ORAL | Status: DC

## 2011-06-09 MED ORDER — FLUTICASONE PROPIONATE 50 MCG/ACT NA SUSP: 2.0000 | Freq: Every day | NASAL | Status: DC

## 2011-06-09 MED ORDER — HYDROCHLOROTHIAZIDE 25 MG PO TABS: 25.0000 mg | ORAL_TABLET | Freq: Every day | ORAL | Status: DC

## 2011-06-09 NOTE — Progress Notes (Signed)
Subjective:    Patient ID: Dana Hayes, female    DOB: 1945-10-20, 65 y.o.   MRN: [id number]  HPI  Tocarra Gassen, a 65 y.o. female presents today in the office for the following:    Feeling well.   Health Maintenance Summary Reviewed and updated, unless pt declines services.  Tobacco History Reviewed. Non-smoker Alcohol: No concerns, no excessive use Exercise Habits: Some activity, rec at least 30 mins 5 times a week STD concerns: none Drug Use: None Birth control method: s/p hyst Lumps or breast concerns: no  Health Maintenance  Topic Date Due  . Tetanus/tdap  12/15/1966  . Influenza Vaccine  05/05/2011  . Mammogram  06/05/2012  . Colonoscopy  12/27/2017  . Zostavax  Completed   Labs reviewed with the patient.   Lipids:    Component Value Date/Time   CHOL 196 06/03/2011 0950   TRIG 122.0 06/03/2011 0950   HDL 61.30 06/03/2011 0950   LDLDIRECT 162.6 05/29/2010 0829   VLDL 24.4 06/03/2011 0950   CHOLHDL 3 06/03/2011 0950    CBC:    Component Value Date/Time   WBC 8.4 06/03/2011 0950   HGB 14.8 06/03/2011 0950   HCT 44.8 06/03/2011 0950   PLT 360.0 06/03/2011 0950   MCV 84.7 06/03/2011 0950   NEUTROABS 4.3 06/03/2011 0950   LYMPHSABS 3.2 06/03/2011 0950   MONOABS 0.6 06/03/2011 0950   EOSABS 0.1 06/03/2011 0950   BASOSABS 0.1 06/03/2011 0950    Basic Metabolic Panel:    Component Value Date/Time   NA 140 06/03/2011 0950   K 4.6 06/03/2011 0950   CL 106 06/03/2011 0950   CO2 26 06/03/2011 0950   BUN 19 06/03/2011 0950   CREATININE 0.7 06/03/2011 0950   GLUCOSE 102* 06/03/2011 0950   CALCIUM 8.8 06/03/2011 0950    Lab Results  Component Value Date   ALT 21 06/03/2011   AST 20 06/03/2011   ALKPHOS 40 06/03/2011   BILITOT 0.5 06/03/2011    HTN: Tolerating all medications without side effects Not at goal right now No CP, no sob. No HA.  BP Readings from Last 3 Encounters:  06/09/11 148/80  01/06/11 140/82  08/06/10 138/82    Basic Metabolic  Panel:    Component Value Date/Time   NA 140 06/03/2011 0950   K 4.6 06/03/2011 0950   CL 106 06/03/2011 0950   CO2 26 06/03/2011 0950   BUN 19 06/03/2011 0950   CREATININE 0.7 06/03/2011 0950   GLUCOSE 102* 06/03/2011 0950   CALCIUM 8.8 06/03/2011 0950        The PMH, PSH, Social History, Family History, Medications, and allergies have been reviewed in [facility], and have been updated if relevant.   Review of Systems General: Denies fever, chills, sweats. No significant weight loss. Eyes: Denies blurring,significant itching ENT: Denies earache, sore throat, and hoarseness.  Cardiovascular: Denies chest pains, palpitations, dyspnea on exertion,  Respiratory: Denies cough, dyspnea at rest,wheeezing Breast: no concerns about lumps GI: Denies nausea, vomiting, diarrhea, constipation, change in bowel habits, abdominal pain, melena, hematochezia GU: Denies dysuria, hematuria, urinary hesitancy, nocturia, denies STD risk, no concerns about discharge Musculoskeletal: Denies back pain, joint pain Derm: Denies rash, itching Neuro: Denies  paresthesias, frequent falls, frequent headaches Psych: Denies depression, anxiety Endocrine: Denies cold intolerance, heat intolerance, polydipsia Heme: Denies enlarged lymph nodes Allergy: No hayfever     Objective:   Physical Exam   Physical Exam  Blood pressure 148/80, pulse 83, temperature 98.2 F (36.8 C), temperature  source Tympanic, height 5\' 1"  (1.549 m), weight 168 lb (76.204 kg).  GEN: well developed, well nourished, no acute distress Eyes: conjunctiva and lids normal, PERRLA, EOMI ENT: TM clear, nares clear, oral exam WNL Neck: supple, no lymphadenopathy, no thyromegaly, no JVD Pulm: clear to auscultation and percussion, respiratory effort normal CV: regular rate and rhythm, S1-S2, no murmur, rub or gallop, no bruits, peripheral pulses normal and symmetric, no cyanosis, clubbing, edema or varicosities Chest: no scars, masses, no  gynecomastia   BREAST: defer GI: soft, non-tender; no hepatosplenomegaly, masses; active bowel sounds all quadrants GU: defer Lymph: no cervical, axillary or inguinal adenopathy MSK: gait normal, muscle tone and strength WNL, no joint swelling, effusions, discoloration, crepitus  SKIN: clear, good turgor, color WNL, no rashes, lesions, or ulcerations Neuro: normal mental status, normal strength, sensation, and motion Psych: alert; oriented to person, place and time, normally interactive and not anxious or depressed in appearance.       Assessment & Plan:   1. Routine general medical examination at a health care facility    2. Needs flu shot  Flu vaccine greater than or equal to 3yo preservative free IM  3. HYPERTENSION      The patient's preventative maintenance and recommended screening tests for an annual wellness exam were reviewed in full today. Brought up to date unless services declined.  Counselled on the importance of diet, exercise, and its role in overall health and mortality. The patient's FH and SH was reviewed, including their home life, tobacco status, and drug and alcohol status.   HTN: increase HCTZ dose to 25 mg

## 2011-06-11 ENCOUNTER — Ambulatory Visit: Payer: Self-pay | Admitting: Obstetrics & Gynecology

## 2011-06-21 ENCOUNTER — Other Ambulatory Visit: Payer: Self-pay | Admitting: Family Medicine

## 2011-07-12 ENCOUNTER — Other Ambulatory Visit: Payer: Self-pay | Admitting: Family Medicine

## 2011-08-11 ENCOUNTER — Encounter: Payer: Self-pay | Admitting: Family Medicine

## 2011-08-11 ENCOUNTER — Ambulatory Visit (INDEPENDENT_AMBULATORY_CARE_PROVIDER_SITE_OTHER): Payer: 59 | Admitting: Family Medicine

## 2011-08-11 DIAGNOSIS — J45901 Unspecified asthma with (acute) exacerbation: Secondary | ICD-10-CM

## 2011-08-11 DIAGNOSIS — J069 Acute upper respiratory infection, unspecified: Secondary | ICD-10-CM

## 2011-08-11 MED ORDER — ALBUTEROL 90 MCG/ACT IN AERS: 2.0000 | INHALATION_SPRAY | Freq: Four times a day (QID) | RESPIRATORY_TRACT | Status: DC | PRN

## 2011-08-11 MED ORDER — PREDNISONE 20 MG PO TABS: ORAL_TABLET | ORAL | Status: DC

## 2011-08-11 NOTE — Patient Instructions (Addendum)
Mucinex DM twice daily, nasal saline irrgation/spray 3-4 times a day. Albuterol prn wheeze every 4-6 hours. Most likely viral infection trigger. Given chest tightness and decreased peak flow.. Treat Shortness of breath with prednsione taper. Call if not improving in next 72 hours as expected with viral infection.

## 2011-08-11 NOTE — Assessment & Plan Note (Signed)
Symptomatic care 

## 2011-08-11 NOTE — Assessment & Plan Note (Signed)
Most likely viral infection trigger. Given chest tightness and decreased peak flow.. Treat SOB with prednsione taper and albuterol prn cough/wheeze SOB.

## 2011-08-11 NOTE — Progress Notes (Signed)
  Subjective:    Patient ID: Dana Hayes, female    DOB: Aug 08, 1945, 66 y.o.   MRN: [id number]  Sinusitis This is a new problem. The current episode started in the past 7 days. The problem has been gradually worsening since onset. Associated symptoms include congestion, coughing, headaches, shortness of breath, sinus pressure and a sore throat. Pertinent negatives include no ear pain or neck pain. (Productive cough, keeping her up at night  ears feel full, popping Gum hurting on top Tightness in chest, no wheeze) Treatments tried: coricidin, motrin. The treatment provided mild relief.   Does not have albuterol inhaler.. Never filled last prescription, but feels would use it now.    Has history of asthma.  Predicted peak flo 436.. Today's best:320 Review of Systems  HENT: Positive for congestion, sore throat and sinus pressure. Negative for ear pain and neck pain.   Respiratory: Positive for cough and shortness of breath.   Neurological: Positive for headaches.       Objective:   Physical Exam  Constitutional: Vital signs are normal. She appears well-developed and well-nourished. She is cooperative.  Non-toxic appearance. She does not appear ill. No distress.  HENT:  Head: Normocephalic.  Right Ear: Hearing, tympanic membrane, external ear and ear canal normal. Tympanic membrane is not erythematous, not retracted and not bulging.  Left Ear: Hearing, tympanic membrane, external ear and ear canal normal. Tympanic membrane is not erythematous, not retracted and not bulging.  Nose: Mucosal edema and rhinorrhea present. Right sinus exhibits maxillary sinus tenderness. Right sinus exhibits no frontal sinus tenderness. Left sinus exhibits maxillary sinus tenderness. Left sinus exhibits no frontal sinus tenderness.  Mouth/Throat: Uvula is midline, oropharynx is clear and moist and mucous membranes are normal.  Eyes: Conjunctivae, EOM and lids are normal. Pupils are equal, round, and reactive to  light. No foreign bodies found.  Neck: Trachea normal and normal range of motion. Neck supple. Carotid bruit is not present. No mass and no thyromegaly present.  Cardiovascular: Normal rate, regular rhythm, S1 normal, S2 normal, normal heart sounds, intact distal pulses and normal pulses.  Exam reveals no gallop and no friction rub.   No murmur heard. Pulmonary/Chest: Effort normal and breath sounds normal. Not tachypneic. No respiratory distress. She has no decreased breath sounds. She has no wheezes. She has no rhonchi. She has no rales.  Neurological: She is alert.  Skin: Skin is warm, dry and intact. No rash noted.  Psychiatric: Her speech is normal and behavior is normal. Judgment normal. Her mood appears not anxious. Cognition and memory are normal. She does not exhibit a depressed mood.          Assessment & Plan:

## 2012-03-01 ENCOUNTER — Ambulatory Visit (INDEPENDENT_AMBULATORY_CARE_PROVIDER_SITE_OTHER): Payer: 59 | Admitting: Family Medicine

## 2012-03-01 ENCOUNTER — Encounter: Payer: Self-pay | Admitting: Family Medicine

## 2012-03-01 VITALS — BP 138/92 | HR 80 | Temp 98.6°F | Ht 61.0 in | Wt 171.0 lb

## 2012-03-01 DIAGNOSIS — M545 Low back pain: Secondary | ICD-10-CM

## 2012-03-01 DIAGNOSIS — G5700 Lesion of sciatic nerve, unspecified lower limb: Secondary | ICD-10-CM

## 2012-03-01 DIAGNOSIS — G5701 Lesion of sciatic nerve, right lower limb: Secondary | ICD-10-CM

## 2012-03-01 MED ORDER — IBUPROFEN 800 MG PO TABS: 800.0000 mg | ORAL_TABLET | Freq: Three times a day (TID) | ORAL | Status: AC | PRN

## 2012-03-01 MED ORDER — CYCLOBENZAPRINE HCL 10 MG PO TABS: 5.0000 mg | ORAL_TABLET | Freq: Three times a day (TID) | ORAL | Status: AC | PRN

## 2012-03-01 NOTE — Patient Instructions (Addendum)

## 2012-03-01 NOTE — Progress Notes (Signed)
Patient Name: Dana Hayes Date of Birth: 04-21-1946 Medical Record Number: [id number] Gender: female  PCP: Hannah Beat, MD  History of Present Illness:  Dana Hayes is a 66 y.o. very pleasant female patient who presents with the following: Back Pain  ongoing for approximately: 2 weeks The patient has had back pain before. The back pain is localized into the lumbar spine area. They also describe no radiculopathy. Some buttocks pain. She was sitting with her brother who was dying with cancer and in a chair for at least 6 hours.  A couple of weeks ago, some on the R side, then will go down into the posterior thigh and buttocks. Tried to walk some, and getting in and out of the car is worse. Taking a lot of alleve or advil. Sometimes will take 3 advil at night.    No numbness or tingling. No bowel or bladder incontinence. No focal weakness. Prior interventions: no Physical therapy: No Chiropractic manipulations: No Acupuncture: No Osteopathic manipulation: No Heat or cold: Minimal effect  Past Medical History, Surgical History, Family History, Medications, Allergies have been reviewed and updated if relevant.  Review of Systems  GEN: No fevers, chills. Nontoxic. Primarily MSK c/o today. MSK: Detailed in the HPI GI: tolerating PO intake without difficulty Neuro: As above  Grief from brother's death Otherwise the pertinent positives of the ROS are noted above.    Physical Exam  Filed Vitals:   03/01/12 0823  BP: 138/92  Pulse: 80  Temp: 98.6 F (37 C)  TempSrc: Oral  Height: 5\' 1"  (1.549 m)  Weight: 171 lb (77.565 kg)    Gen: Well-developed,well-nourished,in no acute distress; alert,appropriate and cooperative throughout examination HEENT: Normocephalic and atraumatic without obvious abnormalities.  Ears, externally no deformities Pulm: Breathing comfortably in no respiratory distress Range of motion at  the waist: Flexion, rotation and lateral bending: full  No  echymosis or edema Rises to examination table with no difficulty Gait: minimally antalgic  Inspection/Deformity: No abnormality Paraspinus T:  mild  B Ankle Dorsiflexion (L5,4): 5/5 B Great Toe Dorsiflexion (L5,4): 5/5 Heel Walk (L5): WNL Toe Walk (S1): WNL Rise/Squat (L4): WNL, mild pain  SENSORY B Medial Foot (L4): WNL B Dorsum (L5): WNL B Lateral (S1): WNL Light Touch: WNL Pinprick: WNL  REFLEXES Knee (L4): 2+ Ankle (S1): 2+  B SLR, seated: neg B SLR, supine: neg B FABER: neg B Reverse FABER: neg Some tenderness at R piriformis B Greater Troch: NT B Log Roll: neg B Stork: NT B Sciatic Notch: NT   Assessment and Plan:  Back pain  1. Low back pain   2. Piriformis syndrome of right side     Also given a handout with more extensive Piriformis stretching, hip flexor and abductor strengthening, ham stretching  Rec deep massage, explained self-massage with ball   Start with medications, core rehab, and progress from there following low back pain algorithm. No red flags are present.  Meds ordered this encounter  Medications  . omeprazole (PRILOSEC OTC) 20 MG tablet    Sig: Take 20 mg by mouth daily.  Marland Kitchen ibuprofen (ADVIL,MOTRIN) 200 MG tablet    Sig: Take 200 mg by mouth every 6 (six) hours as needed.  . naproxen sodium (ANAPROX) 220 MG tablet    Sig: Take 220 mg by mouth as needed.  . fluticasone (FLONASE) 50 MCG/ACT nasal spray    Sig: Place 2 sprays into the nose daily as needed.  Marland Kitchen ibuprofen (ADVIL,MOTRIN) 800 MG tablet  Sig: Take 1 tablet (800 mg total) by mouth every 8 (eight) hours as needed for pain.    Dispense:  60 tablet    Refill:  2  . cyclobenzaprine (FLEXERIL) 10 MG tablet    Sig: Take 0.5 tablets (5 mg total) by mouth 3 (three) times daily as needed for muscle spasms.    Dispense:  30 tablet    Refill:  2

## 2012-08-13 ENCOUNTER — Other Ambulatory Visit: Payer: 59

## 2012-08-13 ENCOUNTER — Other Ambulatory Visit: Payer: Self-pay | Admitting: Family Medicine

## 2012-08-16 ENCOUNTER — Other Ambulatory Visit (INDEPENDENT_AMBULATORY_CARE_PROVIDER_SITE_OTHER): Payer: 59

## 2012-08-16 DIAGNOSIS — Z79899 Other long term (current) drug therapy: Secondary | ICD-10-CM

## 2012-08-16 DIAGNOSIS — R5381 Other malaise: Secondary | ICD-10-CM

## 2012-08-16 DIAGNOSIS — E785 Hyperlipidemia, unspecified: Secondary | ICD-10-CM

## 2012-08-16 LAB — HEPATIC FUNCTION PANEL
Bilirubin, Direct: 0.1 mg/dL (ref 0.0–0.3)
Total Bilirubin: 0.6 mg/dL (ref 0.3–1.2)
Total Protein: 6 g/dL (ref 6.0–8.3)

## 2012-08-16 LAB — CBC WITH DIFFERENTIAL/PLATELET
Basophils Absolute: 0.1 10*3/uL (ref 0.0–0.1)
Eosinophils Absolute: 0.2 10*3/uL (ref 0.0–0.7)
Lymphocytes Relative: 42.1 % (ref 12.0–46.0)
MCHC: 33.3 g/dL (ref 30.0–36.0)
MCV: 83.5 fl (ref 78.0–100.0)
Monocytes Absolute: 0.6 10*3/uL (ref 0.1–1.0)
Neutrophils Relative %: 46.8 % (ref 43.0–77.0)
Platelets: 322 10*3/uL (ref 150.0–400.0)
RDW: 13.3 % (ref 11.5–14.6)

## 2012-08-16 LAB — LIPID PANEL
Cholesterol: 193 mg/dL (ref 0–200)
HDL: 51.5 mg/dL (ref >39.00)
LDL Cholesterol: 117 mg/dL — ABNORMAL HIGH (ref 0–99)
VLDL: 24.4 mg/dL (ref 0.0–40.0)

## 2012-08-16 LAB — TSH: TSH: 0.51 u[IU]/mL (ref 0.35–5.50)

## 2012-08-16 LAB — BASIC METABOLIC PANEL
BUN: 17 mg/dL (ref 6–23)
CO2: 28 mEq/L (ref 19–32)
Chloride: 106 mEq/L (ref 96–112)
Glucose, Bld: 95 mg/dL (ref 70–99)
Potassium: 4.5 mEq/L (ref 3.5–5.1)

## 2012-08-23 ENCOUNTER — Encounter: Payer: Self-pay | Admitting: Family Medicine

## 2012-08-23 ENCOUNTER — Ambulatory Visit (INDEPENDENT_AMBULATORY_CARE_PROVIDER_SITE_OTHER): Payer: 59 | Admitting: Family Medicine

## 2012-08-23 VITALS — BP 120/74 | HR 93 | Temp 97.4°F | Ht 61.0 in | Wt 170.8 lb

## 2012-08-23 DIAGNOSIS — Z23 Encounter for immunization: Secondary | ICD-10-CM

## 2012-08-23 DIAGNOSIS — Z0000 Encounter for general adult medical examination without abnormal findings: Secondary | ICD-10-CM

## 2012-08-23 NOTE — Progress Notes (Signed)
[facility] [address] [place] [address] Phone: [phone] Fax: [phone]  Date:  08/23/2012   Name:  Dana Hayes   DOB:  04/14/1946   MRN:  [id number] Gender: female Age: 67 y.o.  PCP:  Hannah Beat, MD  Evaluating MD: Hannah Beat, MD   Chief Complaint: Annual Exam   History of Present Illness:  Dana Hayes is a 67 y.o. pleasant patient who presents with the following:  CPX:  Tdap: needs that Flu - done Mammogram - scheduled in 2 weeks  Wt Readings from Last 3 Encounters:  08/23/12 170 lb 12 oz (77.452 kg)  03/01/12 171 lb (77.565 kg)  08/11/11 171 lb 1.9 oz (77.62 kg)   Health Maintenance Summary Reviewed and updated, unless pt declines services.  Tobacco History Reviewed. Non-smoker Alcohol: No concerns, no excessive use Exercise Habits: less now, rare, since commute increased [place] STD concerns: none Drug Use: None Birth control method: s/p hyst Menses regular: n/a Lumps or breast concerns: no  Health Maintenance  Topic Date Due  . Mammogram  06/05/2012  . Influenza Vaccine  04/04/2013  . Colonoscopy  12/27/2017  . Tetanus/tdap  08/23/2022  . Zostavax  Completed    Labs reviewed with the patient.  Results for orders placed in visit on 08/16/12  LIPID PANEL      Component Value Range   Cholesterol 193  0 - 200 mg/dL   Triglycerides 284.1  0.0 - 149.0 mg/dL   HDL 32.44  >01.02 mg/dL   VLDL 72.5  0.0 - 36.6 mg/dL   LDL Cholesterol 440 (*) 0 - 99 mg/dL   Total CHOL/HDL Ratio 4    CBC WITH DIFFERENTIAL      Component Value Range   WBC 7.8  4.5 - 10.5 K/uL   RBC 5.00  3.87 - 5.11 Mil/uL   Hemoglobin 13.9  12.0 - 15.0 g/dL   HCT 34.7  42.5 - 95.6 %   MCV 83.5  78.0 - 100.0 fl   MCHC 33.3  30.0 - 36.0 g/dL   RDW 38.7  56.4 - 33.2 %   Platelets 322.0  150.0 - 400.0 K/uL   Neutrophils Relative 46.8  43.0 - 77.0 %   Lymphocytes Relative 42.1  12.0 - 46.0 %   Monocytes Relative 7.8  3.0 -  12.0 %   Eosinophils Relative 2.4  0.0 - 5.0 %   Basophils Relative 0.9  0.0 - 3.0 %   Neutro Abs 3.6  1.4 - 7.7 K/uL   Lymphs Abs 3.3  0.7 - 4.0 K/uL   Monocytes Absolute 0.6  0.1 - 1.0 K/uL   Eosinophils Absolute 0.2  0.0 - 0.7 K/uL   Basophils Absolute 0.1  0.0 - 0.1 K/uL  HEPATIC FUNCTION PANEL      Component Value Range   Total Bilirubin 0.6  0.3 - 1.2 mg/dL   Bilirubin, Direct 0.1  0.0 - 0.3 mg/dL   Alkaline Phosphatase 36 (*) 39 - 117 U/L   AST 17  0 - 37 U/L   ALT 22  0 - 35 U/L   Total Protein 6.0  6.0 - 8.3 g/dL   Albumin 3.7  3.5 - 5.2 g/dL  BASIC METABOLIC PANEL      Component Value Range   Sodium 140  135 - 145 mEq/L   Potassium 4.5  3.5 - 5.1 mEq/L   Chloride 106  96 - 112 mEq/L   CO2 28  19 - 32 mEq/L   Glucose, Bld 95  70 - 99 mg/dL   BUN 17  6 - 23 mg/dL   Creatinine, Ser 0.6  0.4 - 1.2 mg/dL   Calcium 8.9  8.4 - 86.5 mg/dL   GFR 784.69  >62.95 mL/min  TSH      Component Value Range   TSH 0.51  0.35 - 5.50 uIU/mL     Patient Active Problem List  Diagnosis  . HYPERLIPIDEMIA  . HYPERTENSION  . ALLERGIC RHINITIS  . ASTHMA  . GERD  . DIVERTICULOSIS OF COLON  . OSTEOPENIA  . OTHER MALAISE AND FATIGUE  . NEPHROLITHIASIS, HX OF    Past Medical History  Diagnosis Date  . Asthma   . GERD (gastroesophageal reflux disease)   . Allergy   . Hyperlipidemia   . Hypertension     Past Surgical History  Procedure Date  . Abdominal hysterectomy   . Lithotripsy      TIMES 2 1990    History  Substance Use Topics  . Smoking status: Never Smoker   . Smokeless tobacco: Never Used  . Alcohol Use: Yes     Comment: occassionally a glass of wine or beer    Family History  Problem Relation Age of Onset  . Coronary artery disease Mother   . Diabetes Mother   . Coronary artery disease Brother   . Heart attack Brother     Allergies  Allergen Reactions  . Codeine     REACTION: Nausea and vomiting  . Estradiol     REACTION: rash at site of  administration    Medication list has been reviewed and updated.  Outpatient Prescriptions Prior to Visit  Medication Sig Dispense Refill  . albuterol (PROVENTIL,VENTOLIN) 90 MCG/ACT inhaler Inhale 2 puffs into the lungs every 6 (six) hours as needed.  17 g  2  . Biotin 5 MG TABS Take 1 tablet by mouth daily.        . Calcium-Vitamin D-Vitamin K (VIACTIV) 500-100-40 MG-UNT-MCG CHEW Chew 1 tablet by mouth daily.        . Flaxseed, Linseed, (FLAX SEED OIL) 1000 MG CAPS Take 1 capsule by mouth daily.        . fluticasone (FLONASE) 50 MCG/ACT nasal spray Place 2 sprays into the nose daily as needed.      . hydrochlorothiazide (HYDRODIURIL) 12.5 MG tablet TAKE 1 TABLET DAILY  90 tablet  1  . ibuprofen (ADVIL,MOTRIN) 200 MG tablet Take 200 mg by mouth every 6 (six) hours as needed.      . Multiple Vitamin (MULTIVITAMIN) tablet Take 1 tablet by mouth daily.        . naproxen sodium (ANAPROX) 220 MG tablet Take 220 mg by mouth as needed.      . Omega-3 Fatty Acids (FISH OIL) 300 MG CAPS Take 1 capsule by mouth daily.        Marland Kitchen omeprazole (PRILOSEC OTC) 20 MG tablet Take 20 mg by mouth daily.      . Phenazopyridine HCl (AZO DINE MAXIMUM STRENGTH) 97.5 MG TABS Take 1 tablet by mouth as directed.        . [DISCONTINUED] hydrochlorothiazide (HYDRODIURIL) 25 MG tablet Take 1 tablet (25 mg total) by mouth daily.  90 tablet  3  . simvastatin (ZOCOR) 40 MG tablet TAKE 1 TABLET AT BEDTIME  90 tablet  2   Last reviewed on 08/23/2012  2:24 PM by Consuello Masse, CMA  Review of Systems:  General: Denies fever, chills, sweats. No significant weight loss. Eyes: Denies blurring,significant itching ENT: Denies earache, sore throat, and hoarseness.  Cardiovascular: Denies chest pains, palpitations, dyspnea on exertion,  Respiratory: Denies cough, dyspnea at rest,wheeezing Breast: no concerns about lumps GI: Denies nausea, vomiting, diarrhea, constipation, change in bowel habits, abdominal pain, melena,  hematochezia GU: Denies dysuria, hematuria, urinary hesitancy, nocturia, denies STD risk, no concerns about discharge Musculoskeletal: Denies back pain, joint pain Derm: Denies rash, itching Neuro: Denies  paresthesias, frequent falls, frequent headaches Psych: Denies depression, anxiety Endocrine: Denies cold intolerance, heat intolerance, polydipsia Heme: Denies enlarged lymph nodes Allergy: No hayfever  Physical Examination: BP 120/74  Pulse 93  Temp 97.4 F (36.3 C) (Oral)  Ht 5\' 1"  (1.549 m)  Wt 170 lb 12 oz (77.452 kg)  BMI 32.26 kg/m2  SpO2 97%  Ideal Body Weight: Weight in (lb) to have BMI = 25: 132    Wt Readings from Last 3 Encounters:  08/23/12 170 lb 12 oz (77.452 kg)  03/01/12 171 lb (77.565 kg)  08/11/11 171 lb 1.9 oz (77.62 kg)    GEN: well developed, well nourished, no acute distress Eyes: conjunctiva and lids normal, PERRLA, EOMI ENT: TM clear, nares clear, oral exam WNL Neck: supple, no lymphadenopathy, no thyromegaly, no JVD Pulm: clear to auscultation and percussion, respiratory effort normal CV: regular rate and rhythm, S1-S2, no murmur, rub or gallop, no bruits Chest: no scars, masses, no lumps BREAST: no lumps, no axillary LAD, no nipple discharge GI: soft, non-tender; no hepatosplenomegaly, masses; active bowel sounds all quadrants GU: defer Lymph: no cervical, axillary or inguinal adenopathy MSK: gait normal, muscle tone and strength WNL, no joint swelling, effusions, discoloration, crepitus  SKIN: clear, good turgor, color WNL, no rashes, lesions, or ulcerations Neuro: normal mental status, normal strength, sensation, and motion Psych: alert; oriented to person, place and time, normally interactive and not anxious or depressed in appearance.   Assessment and Plan:  1. Routine general medical examination at a health care facility    2. Immunization due  Tdap vaccine greater than or equal to 7yo IM    The patient's preventative maintenance  and recommended screening tests for an annual wellness exam were reviewed in full today. Brought up to date unless services declined.  Counselled on the importance of diet, exercise, and its role in overall health and mortality. The patient's FH and SH was reviewed, including their home life, tobacco status, and drug and alcohol status.   Doing well Update Tdap Mammo in a couple of weeks  Orders Today:  Orders Placed This Encounter  Procedures  . Tdap vaccine greater than or equal to 7yo IM    Updated Medication List: (Includes new medications, updates to list, dose adjustments) No orders of the defined types were placed in this encounter.    Medications Discontinued: Medications Discontinued During This Encounter  Medication Reason  . hydrochlorothiazide (HYDRODIURIL) 25 MG tablet      Hannah Beat, MD

## 2012-12-22 ENCOUNTER — Telehealth: Payer: Self-pay

## 2012-12-22 NOTE — Telephone Encounter (Signed)
Triage Record Num: [id number] Operator: Maryfrances Bunnell Patient Name: Dana Hayes Call Date & Time: 12/21/2012 5:21:49PM Patient Phone: [phone] PCP: Hannah Beat Patient Gender: Female PCP Fax : [phone] Patient DOB: 21-Nov-1945 Practice Name[facility] Reason for Call: Caller: [name]/Patient; PCP: Hannah Beat (Family Practice); CB#: [phone]; Call regarding Diarrhea, abd pain; Onset 12/19/12, feeling bad. Woke up at 1 am vomiting. 12/20/12 diarrhea started. States diarrhea has been nonstop today. Drinking gatoraide. Ate some chicken noodle soup then started having really bad cramps. Going to the bathroom about every 10 minutes. Afebrile. Per Diarrhea or Other Change in Bowel Habits Protocol "New onset diarrhea with any of the following: Mild abdominal cramping, generalized aching, temperature up to 101.5 or several episodes of nausea/vomiting." Home care advice given. Emergent symptoms reviewed. Protocol(s) Used: Diarrhea or Other Change in Bowel Habits Recommended Outcome per Protocol: Provide Home/Self Care Reason for Outcome: New onset diarrhea with any of the following: mild abdominal cramping, generalized aching, temperature up to 101.5 F (38.6 C) or several episodes of nausea/vomiting Care Advice: Antidiarrheal medications are usually unnecessary. Use only after consulting your provider. Application of A&D ointment or witch hazel medicated pads may help relieve anal irritation. ~ ~ SYMPTOM / CONDITION MANAGEMENT ~ CAUTIONS Vomiting and Diarrhea Care: - Do not eat solid foods until vomiting subsides. - Begin taking fluids by sucking on ice chips or popsicles or taking sips of cool, clear fluids (soda, fruit juices that are low acid, sports drinks or nonprescription oral rehydration solution). - Gradually drink larger amounts of these fluids so that you are drinking six to eight 8 oz. (1.2 to 1.6 liters) of fluids a day. - Keep activity to a  minimum. - Once vomiting and diarrhea subside, eat smaller, more frequent meals of easily digested foods such as crackers, toast, bananas, rice, cooked cereal, applesauce, broth, baked or mashed potatoes, chicken or [place] without skin. Eat slowly. - Take fluids 30 minutes before or 60 minutes after meals. - Avoid high fat, highly seasoned, high fiber or high sugar content foods. - Avoid extremely hot or cold foods. - Do not take pain medication (such as aspirin, NSAIDs) while nauseated or vomiting. - Do not drink caffeinated or alcoholic beverages. ~ Go to the ED if you have developed signs and symptoms of dehydration such as very dry mouth and tongue; increased pulse rate at rest; no urine output for 12 hours or more; increasing weakness or drowsiness, or lightheadedness when trying to sit upright or standing. ~

## 2013-07-05 ENCOUNTER — Ambulatory Visit (INDEPENDENT_AMBULATORY_CARE_PROVIDER_SITE_OTHER): Payer: Medicare Other

## 2013-07-05 DIAGNOSIS — Z23 Encounter for immunization: Secondary | ICD-10-CM

## 2013-09-14 ENCOUNTER — Telehealth: Payer: Self-pay | Admitting: Family Medicine

## 2013-09-14 DIAGNOSIS — Z01419 Encounter for gynecological examination (general) (routine) without abnormal findings: Secondary | ICD-10-CM

## 2013-09-14 NOTE — Telephone Encounter (Signed)
Gyn consult made  By legislation, she may call and make her own mammo appt.  You do not need a referral to make a mammogram appointment, and you may call to make her own mammogram appointment directly around your schedule.  MAMMOGRAPHY IN [place]:  [place] [phone] [place], [place] [address]  [facility] (Formerly [facility]) [address] [place] [place], [place] [address] Phone: [phone] Toll Free: [phone]  MAMMOGRAPHY IN [place]:  [place] [facility] or [place]) [phone] Located on the campus of [facility])  [facility] Location) [place].  [place], [place] [address]

## 2013-09-14 NOTE — Telephone Encounter (Signed)
Pt is needing referral for mammo and OBGYN referral ?? She goes to [facility] in [place]

## 2013-09-14 NOTE — Telephone Encounter (Signed)
Spoke with [name], she already has mammogram appointment, understand she does not need a referral... Patient advised [name] will call her with referral for GYN.Gasper Sells, MA Student

## 2013-10-13 ENCOUNTER — Other Ambulatory Visit (HOSPITAL_COMMUNITY)
Admission: RE | Admit: 2013-10-13 | Discharge: 2013-10-13 | Disposition: A | Discharge status: Home / Self Care | Payer: Medicare HMO | Source: Ambulatory Visit | Attending: Obstetrics and Gynecology | Admitting: Obstetrics and Gynecology

## 2013-10-13 ENCOUNTER — Encounter: Payer: Self-pay | Admitting: Obstetrics and Gynecology

## 2013-10-13 ENCOUNTER — Ambulatory Visit (INDEPENDENT_AMBULATORY_CARE_PROVIDER_SITE_OTHER): Payer: Commercial Managed Care - HMO | Admitting: Obstetrics and Gynecology

## 2013-10-13 VITALS — BP 169/95 | HR 87 | Ht 61.0 in | Wt 174.0 lb

## 2013-10-13 DIAGNOSIS — Z01419 Encounter for gynecological examination (general) (routine) without abnormal findings: Secondary | ICD-10-CM

## 2013-10-13 DIAGNOSIS — Z124 Encounter for screening for malignant neoplasm of cervix: Secondary | ICD-10-CM | POA: Insufficient documentation

## 2013-10-13 MED ORDER — ESTROGENS, CONJUGATED 0.625 MG/GM VA CREA: 1.0000 | TOPICAL_CREAM | Freq: Every day | VAGINAL | Status: DC

## 2013-10-13 NOTE — Patient Instructions (Signed)
Preventive Care for Adults, Female A healthy lifestyle and preventive care can promote health and wellness. Preventive health guidelines for women include the following key practices.  A routine yearly physical is a good way to check with your health care provider about your health and preventive screening. It is a chance to share any concerns and updates on your health and to receive a thorough exam.  Visit your dentist for a routine exam and preventive care every 6 months. Brush your teeth twice a day and floss once a day. Good oral hygiene prevents tooth decay and gum disease.  The frequency of eye exams is based on your age, health, family medical history, use of contact lenses, and other factors. Follow your health care provider's recommendations for frequency of eye exams.  Eat a healthy diet. Foods like vegetables, fruits, whole grains, low-fat dairy products, and lean protein foods contain the nutrients you need without too many calories. Decrease your intake of foods high in solid fats, added sugars, and salt. Eat the right amount of calories for you.Get information about a proper diet from your health care provider, if necessary.  Regular physical exercise is one of the most important things you can do for your health. Most adults should get at least 150 minutes of moderate-intensity exercise (any activity that increases your heart rate and causes you to sweat) each week. In addition, most adults need muscle-strengthening exercises on 2 or more days a week.  Maintain a healthy weight. The body mass index (BMI) is a screening tool to identify possible weight problems. It provides an estimate of body fat based on height and weight. Your health care provider can find your BMI, and can help you achieve or maintain a healthy weight.For adults 20 years and older:  A BMI below 18.5 is considered underweight.  A BMI of 18.5 to 24.9 is normal.  A BMI of 25 to 29.9 is considered  overweight.  A BMI of 30 and above is considered obese.  Maintain normal blood lipids and cholesterol levels by exercising and minimizing your intake of saturated fat. Eat a balanced diet with plenty of fruit and vegetables. Blood tests for lipids and cholesterol should begin at age 20 and be repeated every 5 years. If your lipid or cholesterol levels are high, you are over 50, or you are at high risk for heart disease, you may need your cholesterol levels checked more frequently.Ongoing high lipid and cholesterol levels should be treated with medicines if diet and exercise are not working.  If you smoke, find out from your health care provider how to quit. If you do not use tobacco, do not start.  Lung cancer screening is recommended for adults aged 55 80 years who are at high risk for developing lung cancer because of a history of smoking. A yearly low-dose CT scan of the lungs is recommended for people who have at least a 30-pack-year history of smoking and are a current smoker or have quit within the past 15 years. A pack year of smoking is smoking an average of 1 pack of cigarettes a day for 1 year (for example: 1 pack a day for 30 years or 2 packs a day for 15 years). Yearly screening should continue until the smoker has stopped smoking for at least 15 years. Yearly screening should be stopped for people who develop a health problem that would prevent them from having lung cancer treatment.  If you are pregnant, do not drink alcohol. If you   are breastfeeding, be very cautious about drinking alcohol. If you are not pregnant and choose to drink alcohol, do not have more than 1 drink per day. One drink is considered to be 12 ounces (355 mL) of beer, 5 ounces (148 mL) of wine, or 1.5 ounces (44 mL) of liquor.  Avoid use of street drugs. Do not share needles with anyone. Ask for help if you need support or instructions about stopping the use of drugs.  High blood pressure causes heart disease and  increases the risk of stroke. Your blood pressure should be checked at least every 1 to 2 years. Ongoing high blood pressure should be treated with medicines if weight loss and exercise do not work.  If you are 20 66 years old, ask your health care provider if you should take aspirin to prevent strokes.  Diabetes screening involves taking a blood sample to check your fasting blood sugar level. This should be done once every 3 years, after age 35, if you are within normal weight and without risk factors for diabetes. Testing should be considered at a younger age or be carried out more frequently if you are overweight and have at least 1 risk factor for diabetes.  Breast cancer screening is essential preventive care for women. You should practice "breast self-awareness." This means understanding the normal appearance and feel of your breasts and may include breast self-examination. Any changes detected, no matter how small, should be reported to a health care provider. Women in their 42s and 30s should have a clinical breast exam (CBE) by a health care provider as part of a regular health exam every 1 to 3 years. After age 74, women should have a CBE every year. Starting at age 43, women should consider having a mammogram (breast X-ray test) every year. Women who have a family history of breast cancer should talk to their health care provider about genetic screening. Women at a high risk of breast cancer should talk to their health care providers about having an MRI and a mammogram every year.  Breast cancer gene (BRCA)-related cancer risk assessment is recommended for women who have family members with BRCA-related cancers. BRCA-related cancers include breast, ovarian, tubal, and peritoneal cancers. Having family members with these cancers may be associated with an increased risk for harmful changes (mutations) in the breast cancer genes BRCA1 and BRCA2. Results of the assessment will determine the need for  genetic counseling and BRCA1 and BRCA2 testing.  The Pap test is a screening test for cervical cancer. A Pap test can show cell changes on the cervix that might become cervical cancer if left untreated. A Pap test is a procedure in which cells are obtained and examined from the lower end of the uterus (cervix).  Women should have a Pap test starting at age 60.  Between ages 63 and 62, Pap tests should be repeated every 2 years.  Beginning at age 43, you should have a Pap test every 3 years as long as the past 3 Pap tests have been normal.  Some women have medical problems that increase the chance of getting cervical cancer. Talk to your health care provider about these problems. It is especially important to talk to your health care provider if a new problem develops soon after your last Pap test. In these cases, your health care provider may recommend more frequent screening and Pap tests.  The above recommendations are the same for women who have or have not gotten the vaccine  for human papillomavirus (HPV).  If you had a hysterectomy for a problem that was not cancer or a condition that could lead to cancer, then you no longer need Pap tests. Even if you no longer need a Pap test, a regular exam is a good idea to make sure no other problems are starting.  If you are between ages 65 and 70 years, and you have had normal Pap tests going back 10 years, you no longer need Pap tests. Even if you no longer need a Pap test, a regular exam is a good idea to make sure no other problems are starting.  If you have had past treatment for cervical cancer or a condition that could lead to cancer, you need Pap tests and screening for cancer for at least 20 years after your treatment.  If Pap tests have been discontinued, risk factors (such as a new sexual partner) need to be reassessed to determine if screening should be resumed.  The HPV test is an additional test that may be used for cervical cancer  screening. The HPV test looks for the virus that can cause the cell changes on the cervix. The cells collected during the Pap test can be tested for HPV. The HPV test could be used to screen women aged 30 years and older, and should be used in women of any age who have unclear Pap test results. After the age of 30, women should have HPV testing at the same frequency as a Pap test.  Colorectal cancer can be detected and often prevented. Most routine colorectal cancer screening begins at the age of 50 years and continues through age 75 years. However, your health care provider may recommend screening at an earlier age if you have risk factors for colon cancer. On a yearly basis, your health care provider may provide home test kits to check for hidden blood in the stool. Use of a small camera at the end of a tube, to directly examine the colon (sigmoidoscopy or colonoscopy), can detect the earliest forms of colorectal cancer. Talk to your health care provider about this at age 50, when routine screening begins. Direct exam of the colon should be repeated every 5 10 years through age 75 years, unless early forms of pre-cancerous polyps or small growths are found.  People who are at an increased risk for hepatitis B should be screened for this virus. You are considered at high risk for hepatitis B if:  You were born in a country where hepatitis B occurs often. Talk with your health care provider about which countries are considered high risk.  Your parents were born in a high-risk country and you have not received a shot to protect against hepatitis B (hepatitis B vaccine).  You have HIV or AIDS.  You use needles to inject street drugs.  You live with, or have sex with, someone who has Hepatitis B.  You get hemodialysis treatment.  You take certain medicines for conditions like cancer, organ transplantation, and autoimmune conditions.  Hepatitis C blood testing is recommended for all people born from  1945 through 1965 and any individual with known risks for hepatitis C.  Practice safe sex. Use condoms and avoid high-risk sexual practices to reduce the spread of sexually transmitted infections (STIs). STIs include gonorrhea, chlamydia, syphilis, trichomonas, herpes, HPV, and human immunodeficiency virus (HIV). Herpes, HIV, and HPV are viral illnesses that have no cure. They can result in disability, cancer, and death. Sexually active women aged 25   years and younger should be checked for chlamydia. Older women with new or multiple partners should also be tested for chlamydia. Testing for other STIs is recommended if you are sexually active and at increased risk.  Osteoporosis is a disease in which the bones lose minerals and strength with aging. This can result in serious bone fractures or breaks. The risk of osteoporosis can be identified using a bone density scan. Women ages 65 years and over and women at risk for fractures or osteoporosis should discuss screening with their health care providers. Ask your health care provider whether you should take a calcium supplement or vitamin D to reduce the rate of osteoporosis.  Menopause can be associated with physical symptoms and risks. Hormone replacement therapy is available to decrease symptoms and risks. You should talk to your health care provider about whether hormone replacement therapy is right for you.  Use sunscreen. Apply sunscreen liberally and repeatedly throughout the day. You should seek shade when your shadow is shorter than you. Protect yourself by wearing long sleeves, pants, a wide-brimmed hat, and sunglasses year round, whenever you are outdoors.  Once a month, do a whole body skin exam, using a mirror to look at the skin on your back. Tell your health care provider of new moles, moles that have irregular borders, moles that are larger than a pencil eraser, or moles that have changed in shape or color.  Stay current with required  vaccines (immunizations).  Influenza vaccine. All adults should be immunized every year.  Tetanus, diphtheria, and acellular pertussis (Td, Tdap) vaccine. Pregnant women should receive 1 dose of Tdap vaccine during each pregnancy. The dose should be obtained regardless of the length of time since the last dose. Immunization is preferred during the 27th 36th week of gestation. An adult who has not previously received Tdap or who does not know her vaccine status should receive 1 dose of Tdap. This initial dose should be followed by tetanus and diphtheria toxoids (Td) booster doses every 10 years. Adults with an unknown or incomplete history of completing a 3-dose immunization series with Td-containing vaccines should begin or complete a primary immunization series including a Tdap dose. Adults should receive a Td booster every 10 years.  Varicella vaccine. An adult without evidence of immunity to varicella should receive 2 doses or a second dose if she has previously received 1 dose. Pregnant females who do not have evidence of immunity should receive the first dose after pregnancy. This first dose should be obtained before leaving the health care facility. The second dose should be obtained 4 8 weeks after the first dose.  Human papillomavirus (HPV) vaccine. Females aged 13 26 years who have not received the vaccine previously should obtain the 3-dose series. The vaccine is not recommended for use in pregnant females. However, pregnancy testing is not needed before receiving a dose. If a female is found to be pregnant after receiving a dose, no treatment is needed. In that case, the remaining doses should be delayed until after the pregnancy. Immunization is recommended for any person with an immunocompromised condition through the age of 26 years if she did not get any or all doses earlier. During the 3-dose series, the second dose should be obtained 4 8 weeks after the first dose. The third dose should be  obtained 24 weeks after the first dose and 16 weeks after the second dose.  Zoster vaccine. One dose is recommended for adults aged 60 years or older unless certain   conditions are present.  Measles, mumps, and rubella (MMR) vaccine. Adults born before 1957 generally are considered immune to measles and mumps. Adults born in 1957 or later should have 1 or more doses of MMR vaccine unless there is a contraindication to the vaccine or there is laboratory evidence of immunity to each of the three diseases. A routine second dose of MMR vaccine should be obtained at least 28 days after the first dose for students attending postsecondary schools, health care workers, or international travelers. People who received inactivated measles vaccine or an unknown type of measles vaccine during 1963 1967 should receive 2 doses of MMR vaccine. People who received inactivated mumps vaccine or an unknown type of mumps vaccine before 1979 and are at high risk for mumps infection should consider immunization with 2 doses of MMR vaccine. For females of childbearing age, rubella immunity should be determined. If there is no evidence of immunity, females who are not pregnant should be vaccinated. If there is no evidence of immunity, females who are pregnant should delay immunization until after pregnancy. Unvaccinated health care workers born before 1957 who lack laboratory evidence of measles, mumps, or rubella immunity or laboratory confirmation of disease should consider measles and mumps immunization with 2 doses of MMR vaccine or rubella immunization with 1 dose of MMR vaccine.  Pneumococcal 13-valent conjugate (PCV13) vaccine. When indicated, a person who is uncertain of her immunization history and has no record of immunization should receive the PCV13 vaccine. An adult aged 19 years or older who has certain medical conditions and has not been previously immunized should receive 1 dose of PCV13 vaccine. This PCV13 should be  followed with a dose of pneumococcal polysaccharide (PPSV23) vaccine. The PPSV23 vaccine dose should be obtained at least 8 weeks after the dose of PCV13 vaccine. An adult aged 19 years or older who has certain medical conditions and previously received 1 or more doses of PPSV23 vaccine should receive 1 dose of PCV13. The PCV13 vaccine dose should be obtained 1 or more years after the last PPSV23 vaccine dose.  Pneumococcal polysaccharide (PPSV23) vaccine. When PCV13 is also indicated, PCV13 should be obtained first. All adults aged 65 years and older should be immunized. An adult younger than age 65 years who has certain medical conditions should be immunized. Any person who resides in a nursing home or long-term care facility should be immunized. An adult smoker should be immunized. People with an immunocompromised condition and certain other conditions should receive both PCV13 and PPSV23 vaccines. People with human immunodeficiency virus (HIV) infection should be immunized as soon as possible after diagnosis. Immunization during chemotherapy or radiation therapy should be avoided. Routine use of PPSV23 vaccine is not recommended for American Indians, [place] Natives, or people younger than 65 years unless there are medical conditions that require PPSV23 vaccine. When indicated, people who have unknown immunization and have no record of immunization should receive PPSV23 vaccine. One-time revaccination 5 years after the first dose of PPSV23 is recommended for people aged 19 64 years who have chronic kidney failure, nephrotic syndrome, asplenia, or immunocompromised conditions. People who received 1 2 doses of PPSV23 before age 65 years should receive another dose of PPSV23 vaccine at age 65 years or later if at least 5 years have passed since the previous dose. Doses of PPSV23 are not needed for people immunized with PPSV23 at or after age 65 years.  Meningococcal vaccine. Adults with asplenia or persistent  complement component deficiencies should receive 2   doses of quadrivalent meningococcal conjugate (MenACWY-D) vaccine. The doses should be obtained at least 2 months apart. Microbiologists working with certain meningococcal bacteria, military recruits, people at risk during an outbreak, and people who travel to or live in countries with a high rate of meningitis should be immunized. A first-year college student up through age 21 years who is living in a residence hall should receive a dose if she did not receive a dose on or after her 16th birthday. Adults who have certain high-risk conditions should receive one or more doses of vaccine.  Hepatitis A vaccine. Adults who wish to be protected from this disease, have certain high-risk conditions, work with hepatitis A-infected animals, work in hepatitis A research labs, or travel to or work in countries with a high rate of hepatitis A should be immunized. Adults who were previously unvaccinated and who anticipate close contact with an international adoptee during the first 60 days after arrival in the [place] from a country with a high rate of hepatitis A should be immunized.  Hepatitis B vaccine. Adults who wish to be protected from this disease, have certain high-risk conditions, may be exposed to blood or other infectious body fluids, are household contacts or sex partners of hepatitis B positive people, are clients or workers in certain care facilities, or travel to or work in countries with a high rate of hepatitis B should be immunized.  Haemophilus influenzae type b (Hib) vaccine. A previously unvaccinated person with asplenia or sickle cell disease or having a scheduled splenectomy should receive 1 dose of Hib vaccine. Regardless of previous immunization, a recipient of a hematopoietic stem cell transplant should receive a 3-dose series 6 12 months after her successful transplant. Hib vaccine is not recommended for adults with HIV  infection. Preventive Services / Frequency Ages 19 to 39years  Blood pressure check.** / Every 1 to 2 years.  Lipid and cholesterol check.** / Every 5 years beginning at age 20.  Clinical breast exam.** / Every 3 years for women in their 20s and 30s.  BRCA-related cancer risk assessment.** / For women who have family members with a BRCA-related cancer (breast, ovarian, tubal, or peritoneal cancers).  Pap test.** / Every 2 years from ages 21 through 29. Every 3 years starting at age 30 through age 65 or 70 with a history of 3 consecutive normal Pap tests.  HPV screening.** / Every 3 years from ages 30 through ages 65 to 70 with a history of 3 consecutive normal Pap tests.  Hepatitis C blood test.** / For any individual with known risks for hepatitis C.  Skin self-exam. / Monthly.  Influenza vaccine. / Every year.  Tetanus, diphtheria, and acellular pertussis (Tdap, Td) vaccine.** / Consult your health care provider. Pregnant women should receive 1 dose of Tdap vaccine during each pregnancy. 1 dose of Td every 10 years.  Varicella vaccine.** / Consult your health care provider. Pregnant females who do not have evidence of immunity should receive the first dose after pregnancy.  HPV vaccine. / 3 doses over 6 months, if 26 and younger. The vaccine is not recommended for use in pregnant females. However, pregnancy testing is not needed before receiving a dose.  Measles, mumps, rubella (MMR) vaccine.** / You need at least 1 dose of MMR if you were born in 1957 or later. You may also need a 2nd dose. For females of childbearing age, rubella immunity should be determined. If there is no evidence of immunity, females who are not   pregnant should be vaccinated. If there is no evidence of immunity, females who are pregnant should delay immunization until after pregnancy.  Pneumococcal 13-valent conjugate (PCV13) vaccine.** / Consult your health care provider.  Pneumococcal polysaccharide (PPSV23)  vaccine.** / 1 to 2 doses if you smoke cigarettes or if you have certain conditions.  Meningococcal vaccine.** / 1 dose if you are age 88 to 41 years and a Market researcher living in a residence hall, or have one of several medical conditions, you need to get vaccinated against meningococcal disease. You may also need additional booster doses.  Hepatitis A vaccine.** / Consult your health care provider.  Hepatitis B vaccine.** / Consult your health care provider.  Haemophilus influenzae type b (Hib) vaccine.** / Consult your health care provider. Ages 45 to 64years  Blood pressure check.** / Every 1 to 2 years.  Lipid and cholesterol check.** / Every 5 years beginning at age 2 years.  Lung cancer screening. / Every year if you are aged 10 80 years and have a 30-pack-year history of smoking and currently smoke or have quit within the past 15 years. Yearly screening is stopped once you have quit smoking for at least 15 years or develop a health problem that would prevent you from having lung cancer treatment.  Clinical breast exam.** / Every year after age 28 years.  BRCA-related cancer risk assessment.** / For women who have family members with a BRCA-related cancer (breast, ovarian, tubal, or peritoneal cancers).  Mammogram.** / Every year beginning at age 63 years and continuing for as long as you are in good health. Consult with your health care provider.  Pap test.** / Every 3 years starting at age 71 years through age 39 or 90 years with a history of 3 consecutive normal Pap tests.  HPV screening.** / Every 3 years from ages 27 years through ages 32 to 72 years with a history of 3 consecutive normal Pap tests.  Fecal occult blood test (FOBT) of stool. / Every year beginning at age 40 years and continuing until age 29 years. You may not need to do this test if you get a colonoscopy every 10 years.  Flexible sigmoidoscopy or colonoscopy.** / Every 5 years for a flexible  sigmoidoscopy or every 10 years for a colonoscopy beginning at age 66 years and continuing until age 47 years.  Hepatitis C blood test.** / For all people born from [birth year] through 1963 and any individual with known risks for hepatitis C.  Skin self-exam. / Monthly.  Influenza vaccine. / Every year.  Tetanus, diphtheria, and acellular pertussis (Tdap/Td) vaccine.** / Consult your health care provider. Pregnant women should receive 1 dose of Tdap vaccine during each pregnancy. 1 dose of Td every 10 years.  Varicella vaccine.** / Consult your health care provider. Pregnant females who do not have evidence of immunity should receive the first dose after pregnancy.  Zoster vaccine.** / 1 dose for adults aged 39 years or older.  Measles, mumps, rubella (MMR) vaccine.** / You need at least 1 dose of MMR if you were born in 1957 or later. You may also need a 2nd dose. For females of childbearing age, rubella immunity should be determined. If there is no evidence of immunity, females who are not pregnant should be vaccinated. If there is no evidence of immunity, females who are pregnant should delay immunization until after pregnancy.  Pneumococcal 13-valent conjugate (PCV13) vaccine.** / Consult your health care provider.  Pneumococcal polysaccharide (PPSV23) vaccine.** / 1  to 2 doses if you smoke cigarettes or if you have certain conditions.  Meningococcal vaccine.** / Consult your health care provider.  Hepatitis A vaccine.** / Consult your health care provider.  Hepatitis B vaccine.** / Consult your health care provider.  Haemophilus influenzae type b (Hib) vaccine.** / Consult your health care provider. Ages 65 years and over  Blood pressure check.** / Every 1 to 2 years.  Lipid and cholesterol check.** / Every 5 years beginning at age 20 years.  Lung cancer screening. / Every year if you are aged 55 80 years and have a 30-pack-year history of smoking and currently smoke or have quit within the past 15 years.  Yearly screening is stopped once you have quit smoking for at least 15 years or develop a health problem that would prevent you from having lung cancer treatment.  Clinical breast exam.** / Every year after age 40 years.  BRCA-related cancer risk assessment.** / For women who have family members with a BRCA-related cancer (breast, ovarian, tubal, or peritoneal cancers).  Mammogram.** / Every year beginning at age 40 years and continuing for as long as you are in good health. Consult with your health care provider.  Pap test.** / Every 3 years starting at age 30 years through age 65 or 70 years with 3 consecutive normal Pap tests. Testing can be stopped between 65 and 70 years with 3 consecutive normal Pap tests and no abnormal Pap or HPV tests in the past 10 years.  HPV screening.** / Every 3 years from ages 30 years through ages 65 or 70 years with a history of 3 consecutive normal Pap tests. Testing can be stopped between 65 and 70 years with 3 consecutive normal Pap tests and no abnormal Pap or HPV tests in the past 10 years.  Fecal occult blood test (FOBT) of stool. / Every year beginning at age 50 years and continuing until age 75 years. You may not need to do this test if you get a colonoscopy every 10 years.  Flexible sigmoidoscopy or colonoscopy.** / Every 5 years for a flexible sigmoidoscopy or every 10 years for a colonoscopy beginning at age 50 years and continuing until age 75 years.  Hepatitis C blood test.** / For all people born from 1945 through 1965 and any individual with known risks for hepatitis C.  Osteoporosis screening.** / A one-time screening for women ages 65 years and over and women at risk for fractures or osteoporosis.  Skin self-exam. / Monthly.  Influenza vaccine. / Every year.  Tetanus, diphtheria, and acellular pertussis (Tdap/Td) vaccine.** / 1 dose of Td every 10 years.  Varicella vaccine.** / Consult your health care provider.  Zoster vaccine.** / 1  dose for adults aged 60 years or older.  Pneumococcal 13-valent conjugate (PCV13) vaccine.** / Consult your health care provider.  Pneumococcal polysaccharide (PPSV23) vaccine.** / 1 dose for all adults aged 65 years and older.  Meningococcal vaccine.** / Consult your health care provider.  Hepatitis A vaccine.** / Consult your health care provider.  Hepatitis B vaccine.** / Consult your health care provider.  Haemophilus influenzae type b (Hib) vaccine.** / Consult your health care provider. ** Family history and personal history of risk and conditions may change your health care provider's recommendations. Document Released: 09/16/2001 Document Revised: 05/11/2013 Document Reviewed: 12/16/2010 Exit[employer] Patient Information 2014 [employer].  

## 2013-10-13 NOTE — Progress Notes (Signed)
  Subjective:     Dana Hayes is a 68 y.o. female who is here for a comprehensive physical exam. The patient reports dyspareunia. She was previously prescribed premarin vaginal cream which worked well for her but secondary to loss of insurance she no longer could afford it. Patient is otherwise doing well  History   Social History  . Marital Status: Married    Spouse Name: N/A    Number of Children: N/A  . Years of Education: N/A   Occupational History  . buyer    Social History Main Topics  . Smoking status: Never Smoker   . Smokeless tobacco: Never Used  . Alcohol Use: Yes     Comment: occassionally a glass of wine or beer  . Drug Use: No  . Sexual Activity: Not on file   Other Topics Concern  . Not on file   Social History Narrative   REGULAR EXERCISE: YES- WALKS 3-4-DAYS A WEEK      Diet: no fast food, instead veggies, loves baking   Health Maintenance  Topic Date Due  . Mammogram  06/05/2012  . Pneumococcal Polysaccharide Vaccine Age 46 And Over  12/14/2012  . Influenza Vaccine  03/04/2014  . Colonoscopy  12/27/2017  . Tetanus/tdap  08/23/2022  . Zostavax  Completed   Past Medical History  Diagnosis Date  . Asthma   . GERD (gastroesophageal reflux disease)   . Allergy   . Hyperlipidemia   . Hypertension    Past Surgical History  Procedure Laterality Date  . Abdominal hysterectomy    . Lithotripsy       TIMES 2 1990   Family History  Problem Relation Age of Onset  . Coronary artery disease Mother   . Diabetes Mother   . Coronary artery disease Brother   . Heart attack Brother    History  Substance Use Topics  . Smoking status: Never Smoker   . Smokeless tobacco: Never Used  . Alcohol Use: Yes     Comment: occassionally a glass of wine or beer       Review of Systems A comprehensive review of systems was negative.   Objective:      GENERAL: Well-developed, well-nourished female in no acute distress.  HEENT: Normocephalic, atraumatic.  Sclerae anicteric.  NECK: Supple. Normal thyroid.  LUNGS: Clear to auscultation bilaterally.  HEART: Regular rate and rhythm. BREASTS: Symmetric in size. No palpable masses or lymphadenopathy, skin changes, or nipple drainage. ABDOMEN: Soft, nontender, nondistended. No organomegaly. PELVIC: Normal external female genitalia. Vagina is pink and rugated.  Normal discharge. Atrophic cervix, almost flush with vagina.  No adnexal mass or tenderness. EXTREMITIES: No cyanosis, clubbing, or edema, 2+ distal pulses.    Assessment:    Healthy female exam.      Plan:     pap smear collected Screening mammogram normal as per patient, 2 weeks ago Patient advised to perfom monthly self breast and vulva exams Rx premarin provided Osphena samples also given Patient will be contacted with any abnormal results See After Visit Summary for Counseling Recommendations

## 2014-01-25 ENCOUNTER — Ambulatory Visit (INDEPENDENT_AMBULATORY_CARE_PROVIDER_SITE_OTHER): Payer: Medicare HMO | Admitting: Family Medicine

## 2014-01-25 ENCOUNTER — Encounter: Payer: Self-pay | Admitting: Family Medicine

## 2014-01-25 VITALS — BP 150/100 | HR 88 | Temp 98.3°F | Ht 61.0 in | Wt 171.5 lb

## 2014-01-25 DIAGNOSIS — M722 Plantar fascial fibromatosis: Secondary | ICD-10-CM

## 2014-01-25 NOTE — Patient Instructions (Signed)
Please read handouts on Plantar Fascitis.  STRETCHING and Strengthening program critically important.  Strengthening on foot and calf muscles as seen in handout. Calf raises, 2 legged, then 1 legged. Foot massage with tennis ball. Ice massage.  Towel Scrunches: get a towel or hand towel, use toes to pick up and scrunch up the towel.  Marble pick-ups, practice picking up marbles with toes and placing into a cup  NEEDS TO BE DONE EVERY DAY  Recommended over the counter insoles. (Spenco or Hapad)  A rigid shoe with good arch support helps: Dansko (great), Keen, Merrell No easily bendable shoes.    

## 2014-01-25 NOTE — Progress Notes (Signed)
Pre visit review using our clinic review tool, if applicable. No additional management support is needed unless otherwise documented below in the visit note. 

## 2014-01-25 NOTE — Progress Notes (Signed)
[place] [address] Phone: [phone] Fax: [phone]  Patient ID: Dana Hayes MRN: [id number], DOB: [birth year], 68 y.o. Date of Encounter: 01/25/2014  Primary Physician:  Owens Loffler, MD   Chief Complaint: Foot Pain   Subjective:   History of Present Illness:  This 68 y.o. female patient presents with a 2 week long history of heel pain. This is notable for worsening pain first thing in the morning when arising and standing after sitting.   Prior foot or ankle fractures: none Prior operations: none Orthotics or bracing: none Medications: none PT or home rehab: some stretching Night splints: no Ice massage: no Ball massage: no  Metatarsal pain: no  The PMH, PSH, Social History, Family History, Medications, and allergies have been reviewed in [facility], and have been updated if relevant.  REVIEW OF SYSTEMS  GEN: No fevers, chills. Nontoxic. Primarily MSK c/o today. MSK: Detailed in the HPI GI: tolerating PO intake without difficulty Neuro: No numbness, parasthesias, or tingling associated. Otherwise the pertinent positives of the ROS are noted above.    Objective:   PHYSICAL EXAM  Blood pressure 150/100, pulse 88, temperature 98.3 F (36.8 C), temperature source Oral, height 5\' 1"  (1.549 m), weight 171 lb 8 oz (77.792 kg).  GEN: Well-developed,well-nourished,in no acute distress; alert,appropriate and cooperative throughout examination HEENT: Normocephalic and atraumatic without obvious abnormalities. Ears, externally no deformities PULM: Breathing comfortably in no respiratory distress EXT: No clubbing, cyanosis, or edema PSYCH: Normally interactive. Cooperative during the interview. Pleasant. Friendly and conversant. Not anxious or depressed appearing. Normal, full affect.  Echymosis: no Edema: no ROM: full LE B Gait: heel toe, non-antalgic MT pain: no Callus pattern: none Lateral Mall: NT Medial Mall: NT Talus: NT Navicular:  NT Calcaneous: NT Metatarsals: NT 5th MT: NT Phalanges: NT Achilles: NT Plantar Fascia: tender, medial along PF. Pain with forced dorsi - mild Fat Pad: NT Peroneals: NT Post Tib: NT Great Toe: Nml motion Ant Drawer: neg Other foot breakdown: none Long arch: preserved Transverse arch: preserved Hindfoot breakdown: none Sensation: intact  Assessment & Plan:   Plantar fascitis:  We reviewed anatomy that stretching and rehab are critically important to the treatment of PF. Reviewed footwear. Rigid soles have been shown to help with PF.  Reviewed rehab of stretching and calf raises.  Reviewed rehab from [facility]  Patient Instructions  Please read handouts on Plantar Fascitis.  STRETCHING and Strengthening program critically important.  Strengthening on foot and calf muscles as seen in handout. Calf raises, 2 legged, then 1 legged. Foot massage with tennis ball. Ice massage.  Towel Scrunches: get a towel or hand towel, use toes to pick up and scrunch up the towel.  Marble pick-ups, practice picking up marbles with toes and placing into a cup  NEEDS TO BE DONE EVERY DAY  Recommended over the counter insoles. (Spenco or Hapad)  A rigid shoe with good arch support helps: Dansko (great), Jennet Maduro, Merrell No easily bendable shoes.         Follow-up: No Follow-up on file. Unless noted above, the patient is to follow-up if symptoms worsen. Red flags were reviewed with the patient.  Signed,  Maud Deed. Copland, MD, CAQ Sports Medicine   Discontinued Medications   No medications on file   Current Medications at Discharge:   Medication List       This list is accurate as of: 01/25/14  2:39 PM.  Always use your most recent  med list.               conjugated estrogens vaginal cream  Commonly known as:  PREMARIN  Place 1 Applicatorful vaginally daily.     esomeprazole 20 MG capsule  Commonly known as:  NEXIUM  Take 20 mg by  mouth daily at 12 noon.     ibuprofen 200 MG tablet  Commonly known as:  ADVIL,MOTRIN  Take 200 mg by mouth every 6 (six) hours as needed.     multivitamin tablet  Take 1 tablet by mouth daily.

## 2014-02-27 ENCOUNTER — Other Ambulatory Visit (INDEPENDENT_AMBULATORY_CARE_PROVIDER_SITE_OTHER): Payer: Commercial Managed Care - HMO

## 2014-02-27 DIAGNOSIS — R5381 Other malaise: Secondary | ICD-10-CM

## 2014-02-27 DIAGNOSIS — M899 Disorder of bone, unspecified: Secondary | ICD-10-CM

## 2014-02-27 DIAGNOSIS — M949 Disorder of cartilage, unspecified: Secondary | ICD-10-CM

## 2014-02-27 DIAGNOSIS — Z79899 Other long term (current) drug therapy: Secondary | ICD-10-CM

## 2014-02-27 DIAGNOSIS — I10 Essential (primary) hypertension: Secondary | ICD-10-CM

## 2014-02-27 DIAGNOSIS — E559 Vitamin D deficiency, unspecified: Secondary | ICD-10-CM

## 2014-02-27 DIAGNOSIS — E785 Hyperlipidemia, unspecified: Secondary | ICD-10-CM

## 2014-02-27 DIAGNOSIS — R5383 Other fatigue: Secondary | ICD-10-CM

## 2014-02-27 LAB — CBC WITH DIFFERENTIAL/PLATELET
BASOS PCT: 0.4 % (ref 0.0–3.0)
Basophils Absolute: 0 10*3/uL (ref 0.0–0.1)
EOS PCT: 1.9 % (ref 0.0–5.0)
Eosinophils Absolute: 0.1 10*3/uL (ref 0.0–0.7)
HCT: 43 % (ref 36.0–46.0)
HEMOGLOBIN: 14.2 g/dL (ref 12.0–15.0)
LYMPHS PCT: 42.9 % (ref 12.0–46.0)
Lymphs Abs: 3.4 10*3/uL (ref 0.7–4.0)
MCHC: 33 g/dL (ref 30.0–36.0)
MCV: 83.2 fl (ref 78.0–100.0)
Monocytes Absolute: 0.6 10*3/uL (ref 0.1–1.0)
Monocytes Relative: 8.1 % (ref 3.0–12.0)
Neutro Abs: 3.6 10*3/uL (ref 1.4–7.7)
Neutrophils Relative %: 46.7 % (ref 43.0–77.0)
Platelets: 337 10*3/uL (ref 150.0–400.0)
RBC: 5.16 Mil/uL — AB (ref 3.87–5.11)
RDW: 13.3 % (ref 11.5–15.5)
WBC: 7.8 10*3/uL (ref 4.0–10.5)

## 2014-02-27 LAB — BASIC METABOLIC PANEL
BUN: 15 mg/dL (ref 6–23)
CALCIUM: 9.1 mg/dL (ref 8.4–10.5)
CO2: 27 meq/L (ref 19–32)
CREATININE: 0.7 mg/dL (ref 0.4–1.2)
Chloride: 107 mEq/L (ref 96–112)
GFR: 91.95 mL/min (ref >60.00)
Glucose, Bld: 107 mg/dL — ABNORMAL HIGH (ref 70–99)
Potassium: 4.1 mEq/L (ref 3.5–5.1)
Sodium: 141 mEq/L (ref 135–145)

## 2014-02-27 LAB — LIPID PANEL
CHOL/HDL RATIO: 5
Cholesterol: 208 mg/dL — ABNORMAL HIGH (ref 0–200)
HDL: 45.6 mg/dL (ref >39.00)
LDL Cholesterol: 138 mg/dL — ABNORMAL HIGH (ref 0–99)
NonHDL: 162.4
Triglycerides: 123 mg/dL (ref 0.0–149.0)
VLDL: 24.6 mg/dL (ref 0.0–40.0)

## 2014-02-27 LAB — HEPATIC FUNCTION PANEL
ALK PHOS: 56 U/L (ref 39–117)
ALT: 29 U/L (ref 0–35)
AST: 24 U/L (ref 0–37)
Albumin: 4 g/dL (ref 3.5–5.2)
Bilirubin, Direct: 0 mg/dL (ref 0.0–0.3)
TOTAL PROTEIN: 6.5 g/dL (ref 6.0–8.3)
Total Bilirubin: 0.7 mg/dL (ref 0.2–1.2)

## 2014-02-27 LAB — TSH: TSH: 0.54 u[IU]/mL (ref 0.35–4.50)

## 2014-02-27 LAB — VITAMIN D 25 HYDROXY (VIT D DEFICIENCY, FRACTURES): VITD: 39.8 ng/mL (ref 30.00–100.00)

## 2014-03-02 ENCOUNTER — Encounter: Payer: Self-pay | Admitting: Family Medicine

## 2014-03-02 ENCOUNTER — Ambulatory Visit (INDEPENDENT_AMBULATORY_CARE_PROVIDER_SITE_OTHER): Payer: Commercial Managed Care - HMO | Admitting: Family Medicine

## 2014-03-02 VITALS — BP 130/86 | HR 88 | Temp 98.6°F | Ht 61.0 in | Wt 172.2 lb

## 2014-03-02 DIAGNOSIS — Z23 Encounter for immunization: Secondary | ICD-10-CM

## 2014-03-02 DIAGNOSIS — Z0000 Encounter for general adult medical examination without abnormal findings: Secondary | ICD-10-CM

## 2014-03-02 NOTE — Progress Notes (Signed)
[place] [address] Phone: [phone] Fax: [phone]  Patient ID: Dana Hayes MRN: [id number], DOB: November 30, 1945, 68 y.o. Date of Encounter: 03/02/2014  Primary Physician:  Owens Loffler, MD   Chief Complaint: Annual Exam  Subjective:   History of Present Illness:  Dana Hayes is a 68 y.o. pleasant patient who presents for a medicare wellness examination:  Health Maintenance Summary Reviewed and updated, unless pt declines services.  Tobacco History Reviewed. Non-smoker Alcohol: No concerns, no excessive use Exercise Habits: Some activity, rec at least 30 mins 5 times a week STD concerns: none Drug Use: None Birth control method: n/a Menses regular: n/a Lumps or breast concerns: no Breast Cancer Family History: no  Health Maintenance  Topic Date Due  . Mammogram  06/05/2012  . Pneumococcal Polysaccharide Vaccine Age 47 And Over  12/14/2012  . Influenza Vaccine  03/04/2014  . Colonoscopy  12/27/2017  . Tetanus/tdap  08/23/2022  . Zostavax  Completed    Immunization History  Administered Date(s) Administered  . Influenza Split 06/09/2011  . Influenza Whole 06/04/2008, 06/12/2009, 06/05/2010  . Influenza,inj,Quad PF,36+ Mos 07/05/2013  . Pneumococcal Conjugate-13 03/02/2014  . Tdap 08/23/2012  . Zoster 07/11/2008   Prevnar-13  Colon q 17  S/p female exam by Dr. Elly Modena.   Patient Active Problem List   Diagnosis Date Noted  . OTHER MALAISE AND FATIGUE 05/29/2010  . DIVERTICULOSIS OF COLON 12/28/2007  . HYPERLIPIDEMIA 10/21/2007  . HYPERTENSION 10/21/2007  . ALLERGIC RHINITIS 10/21/2007  . ASTHMA 10/21/2007  . GERD 10/21/2007  . OSTEOPENIA 10/21/2007  . NEPHROLITHIASIS, HX OF 10/21/2007   Past Medical History  Diagnosis Date  . Asthma   . GERD (gastroesophageal reflux disease)   . Allergy   . Hyperlipidemia   . Hypertension    Past Surgical History  Procedure Laterality Date  . Abdominal hysterectomy    . Lithotripsy        TIMES 2 1990   History   Social History  . Marital Status: Married    Spouse Name: N/A    Number of Children: N/A  . Years of Education: N/A   Occupational History  . buyer    Social History Main Topics  . Smoking status: Never Smoker   . Smokeless tobacco: Never Used  . Alcohol Use: Yes     Comment: occassionally a glass of wine or beer  . Drug Use: No  . Sexual Activity: Not on file   Other Topics Concern  . Not on file   Social History Narrative   REGULAR EXERCISE: YES- WALKS 3-4-DAYS A WEEK      Diet: no fast food, instead veggies, loves baking   Family History  Problem Relation Age of Onset  . Coronary artery disease Mother   . Diabetes Mother   . Coronary artery disease Brother   . Heart attack Brother    Allergies  Allergen Reactions  . Codeine     REACTION: Nausea and vomiting  . Estradiol     REACTION: rash at site of administration    Medication list has been reviewed and updated.  Review of Systems:   General: Denies fever, chills, sweats. No significant weight loss. Eyes: Denies blurring,significant itching ENT: Denies earache, sore throat, and hoarseness.  Cardiovascular: Denies chest pains, palpitations, dyspnea on exertion,  Respiratory: Denies cough, dyspnea at rest,wheeezing Breast: no concerns about lumps GI: Denies nausea, vomiting, diarrhea, constipation, change in bowel habits, abdominal pain, melena, hematochezia GU: Denies dysuria,  hematuria, urinary hesitancy, nocturia, denies STD risk, no concerns about discharge Musculoskeletal: Denies back pain, joint pain Derm: Denies rash, itching Neuro: Denies  paresthesias, frequent falls, frequent headaches Psych: Denies depression, anxiety Endocrine: Denies cold intolerance, heat intolerance, polydipsia Heme: Denies enlarged lymph nodes Allergy: No hayfever  Objective:  Physical Examination: BP 130/86  Pulse 88  Temp(Src) 98.6 F (37 C) (Oral)  Ht 5' 1"  (1.549 m)  Wt 172  lb 4 oz (78.132 kg)  BMI 32.56 kg/m2  The patient completed a fall screen and PHQ-2 and PHQ-9 if necessary, which is documented in the EHR. The CMA/LPN/RN who assisted the patient verbally completed with them and documented results in [facility] EHR.   Hearing Screening   125Hz  250Hz  500Hz  1000Hz  2000Hz  4000Hz  8000Hz   Right ear:   40 40 40 40   Left ear:   40 40 40 40     Visual Acuity Screening   Right eye Left eye Both eyes  Without correction: 20/25 20/25 20/25   With correction:       GEN: well developed, well nourished, no acute distress Eyes: conjunctiva and lids normal, PERRLA, EOMI ENT: TM clear, nares clear, oral exam WNL Neck: supple, no lymphadenopathy, no thyromegaly, no JVD Pulm: clear to auscultation and percussion, respiratory effort normal CV: regular rate and rhythm, S1-S2, no murmur, rub or gallop, no bruits Chest: no scars, masses, no lumps BREAST: breast exam declined GI: soft, non-tender; no hepatosplenomegaly, masses; active bowel sounds all quadrants GU: GU exam declined Lymph: no cervical, axillary or inguinal adenopathy MSK: gait normal, muscle tone and strength WNL, no joint swelling, effusions, discoloration, crepitus  SKIN: clear, good turgor, color WNL, no rashes, lesions, or ulcerations Neuro: normal mental status, normal strength, sensation, and motion Psych: alert; oriented to person, place and time, normally interactive and not anxious or depressed in appearance.   All labs reviewed with patient. Lipids:    Component Value Date/Time   CHOL 208* 02/27/2014 0914   TRIG 123.0 02/27/2014 0914   HDL 45.60 02/27/2014 0914   LDLDIRECT 162.6 05/29/2010 0829   VLDL 24.6 02/27/2014 0914   CHOLHDL 5 02/27/2014 0914   CBC: CBC Latest Ref Rng 02/27/2014 08/16/2012 06/03/2011  WBC 4.0 - 10.5 K/uL 7.8 7.8 8.4  Hemoglobin 12.0 - 15.0 g/dL 14.2 13.9 14.8  Hematocrit 36.0 - 46.0 % 43.0 41.8 44.8  Platelets 150.0 - 400.0 K/uL 337.0 322.0 360.0    Basic  Metabolic Panel:    Component Value Date/Time   NA 141 02/27/2014 0914   K 4.1 02/27/2014 0914   CL 107 02/27/2014 0914   CO2 27 02/27/2014 0914   BUN 15 02/27/2014 0914   CREATININE 0.7 02/27/2014 0914   GLUCOSE 107* 02/27/2014 0914   CALCIUM 9.1 02/27/2014 0914   Hepatic Function Latest Ref Rng 02/27/2014 08/16/2012 06/03/2011  Total Protein 6.0 - 8.3 g/dL 6.5 6.0 6.8  Albumin 3.5 - 5.2 g/dL 4.0 3.7 4.0  AST 0 - 37 U/L 24 17 20   ALT 0 - 35 U/L 29 22 21   Alk Phosphatase 39 - 117 U/L 56 36(L) 40  Total Bilirubin 0.2 - 1.2 mg/dL 0.7 0.6 0.5  Bilirubin, Direct 0.0 - 0.3 mg/dL 0.0 0.1 0.0    Lab Results  Component Value Date   TSH 0.54 02/27/2014    No results found.  Assessment & Plan:   Routine general medical examination at a health care facility  Need for prophylactic vaccination against Streptococcus pneumoniae (pneumococcus) - Plan:  Pneumococcal conjugate vaccine 13-valent  Health Maintenance Exam: The patient's preventative maintenance and recommended screening tests for an annual wellness exam were reviewed in full today. Brought up to date unless services declined.  Counselled on the importance of diet, exercise, and its role in overall health and mortality. The patient's FH and SH was reviewed, including their home life, tobacco status, and drug and alcohol status.  I have personally reviewed the Medicare Annual Wellness questionnaire and have noted 1. The patient's medical and social history 2. Their use of alcohol, tobacco or illicit drugs 3. Their current medications and supplements 4. The patient's functional ability including ADL's, fall risks, home safety risks and hearing or visual             impairment. 5. Diet and physical activities 6. Evidence for depression or mood disorders  The patients weight, height, BMI and visual acuity have been recorded in the chart I have made referrals, counseling and provided education to the patient based review of the above and  I have provided the pt with a written personalized care plan for preventive services.  I have provided the patient with a copy of your personalized plan for preventive services. Instructed to take the time to review along with their updated medication list.  Follow-up: No Follow-up on file. Or follow-up in 1 year for complete physical examination  New Prescriptions   No medications on file   Modified Medications   No medications on file   Orders Placed This Encounter  Procedures  . Pneumococcal conjugate vaccine 13-valent    Signed,  Noura Purpura T. Tzippy Testerman, MD, CAQ Sports Medicine   Discontinued Medications   No medications on file   Current Medications at Discharge:   Medication List       This list is accurate as of: 03/02/14 11:59 PM.  Always use your most recent med list.               Biotin 2500 MCG Caps  Take by mouth daily.     cholecalciferol 1000 UNITS tablet  Commonly known as:  VITAMIN D  Take 1,000 Units by mouth daily.     conjugated estrogens vaginal cream  Commonly known as:  PREMARIN  Place 1 Applicatorful vaginally daily.     esomeprazole 20 MG capsule  Commonly known as:  NEXIUM  Take 20 mg by mouth daily at 12 noon.     ibuprofen 200 MG tablet  Commonly known as:  ADVIL,MOTRIN  Take 200 mg by mouth every 6 (six) hours as needed.     multivitamin tablet  Take 1 tablet by mouth daily.

## 2014-03-02 NOTE — Progress Notes (Signed)
Pre visit review using our clinic review tool, if applicable. No additional management support is needed unless otherwise documented below in the visit note. 

## 2014-04-03 ENCOUNTER — Telehealth: Payer: Self-pay | Admitting: *Deleted

## 2014-04-03 MED ORDER — FINASTERIDE 1 MG PO TABS: 1.0000 mg | ORAL_TABLET | Freq: Every day | ORAL | Status: DC

## 2014-04-03 NOTE — Telephone Encounter (Signed)
finasteride 1 mg, 1 tab daily. #90, 3 ref.  i feel comfortable writing it. Equivalent to propecia.

## 2014-04-03 NOTE — Telephone Encounter (Signed)
Patient left a voicemail stating that she was having a problem with hair loss about 3 years ago and was referred to a dermatologist and she thinks that his name was Wilhemina Bonito. Patient does not remember what he prescribed but stated that Dr. Lorelei Pont had refilled it for her once and wants to know if you can check back and see what it was and prescribe it again or refer her back to the dermatologist. Please advise.

## 2014-04-04 NOTE — Telephone Encounter (Signed)
Ms. Munford notified prescription has been sent to her pharmacy.

## 2014-05-10 ENCOUNTER — Ambulatory Visit (INDEPENDENT_AMBULATORY_CARE_PROVIDER_SITE_OTHER): Payer: Commercial Managed Care - HMO

## 2014-05-10 DIAGNOSIS — Z23 Encounter for immunization: Secondary | ICD-10-CM

## 2014-05-16 ENCOUNTER — Ambulatory Visit: Payer: Commercial Managed Care - HMO

## 2014-11-16 ENCOUNTER — Ambulatory Visit (INDEPENDENT_AMBULATORY_CARE_PROVIDER_SITE_OTHER): Payer: PPO | Admitting: Family Medicine

## 2014-11-16 ENCOUNTER — Encounter: Payer: Self-pay | Admitting: Family Medicine

## 2014-11-16 VITALS — BP 164/92 | HR 96 | Temp 98.8°F | Ht 61.0 in | Wt 170.0 lb

## 2014-11-16 DIAGNOSIS — M65311 Trigger thumb, right thumb: Secondary | ICD-10-CM | POA: Diagnosis not present

## 2014-11-16 MED ORDER — METHYLPREDNISOLONE ACETATE 40 MG/ML IJ SUSP
20.0000 mg | Freq: Once | INTRAMUSCULAR | Status: AC
  Administered 2014-11-16: 20 mg via INTRA_ARTICULAR

## 2014-11-16 NOTE — Progress Notes (Signed)
Dr. Frederico Hamman T. Lyn Deemer, MD, [facility] Medicine [place], [address] Phone: [phone] Fax: [phone]  11/16/2014  Patient: Dana Hayes, MRN: [id number], DOB: 1945/12/07, 69 y.o.  Primary Physician:  Owens Loffler, MD  Chief Complaint: Hand Pain  Subjective:   Dana Hayes is a 69 y.o. very pleasant female patient who presents with the following:  R trigger thumb. Has been doing a lot of heavy cleaning. Ongoing now for few weeks to month. R thumb has been painful and triggering.   Past Medical History, Surgical History, Social History, Family History, Problem List, Medications, and Allergies have been reviewed and updated if relevant.  GEN: No fevers, chills. Nontoxic. Primarily MSK c/o today. MSK: Detailed in the HPI GI: tolerating PO intake without difficulty Neuro: No numbness, parasthesias, or tingling associated. Otherwise the pertinent positives of the ROS are noted above.   Objective:   BP 164/92 mmHg  Pulse 96  Temp(Src) 98.8 F (37.1 C) (Oral)  Ht 5\' 1"  (1.549 m)  Wt 170 lb (77.111 kg)  BMI 32.14 kg/m2   GEN: WDWN, NAD, Non-toxic, Alert & Oriented x 3 HEENT: Atraumatic, Normocephalic.  Ears and Nose: No external deformity. EXTR: No clubbing/cyanosis/edema NEURO: Normal gait.  PSYCH: Normally interactive. Conversant. Not depressed or anxious appearing.  Calm demeanor.   R hand Ecchymosis or edema: neg ROM wrist/hand/digits: full  Carpals, MCP's, digits: NT Distal Ulna and Radius: NT Ecchymosis or edema: neg No instability Cysts/nodules: neg Digit triggering: 1st Finkelstein's test: neg Snuffbox tenderness: neg Scaphoid tubercle: NT Resisted supination: NT Full composite fist, no malrotation Grip, all digits: 5/5 str DIPJT: NT PIP JT: NT MCP JT: NT No tenosynovitis Axial load test: neg Phalen's: neg Tinel's: neg Atrophy: neg  Hand sensation: intact   Radiology: No results  found.  Assessment and Plan:   Trigger thumb of right hand - Plan: methylPREDNISolone acetate (DEPO-MEDROL) injection 20 mg  Using an anatomical model, I reviewed with the patent the structures involved and how they related to their diagnosis .  We discussed the pathophysiology of trigger fingers. Discussed the inflammatory nature of nodule creation and likely nodule abutting the A1 pulley system, this causing the patient's discomfort and sensations. We discussed that treatments for this include direct injection into the tendon sheath to attempt to shrink catching tissue. This can be done 1-2 times. Other treatments include surgical release. If the patient fails to trigger finger injections, I would recommend trigger finger release if the patient desires relief of the symptoms.   Trigger Finger Injection, R Verbal consent was obtained. Risks (including rare risk of infection, potential risk for skin lightening and potential atrophy), benefits and alternatives were discussed. Prepped with Chloraprep and Ethyl Chloride used for anesthesia. Under sterile conditions, patient proximal to MCP aiming distally with 45 degree angle towards nodule; injected directly into tendon sheath. Medication flowed freely without resistance.  Needle size: 22 gauge 1 1/2 inch Injection: 1/2 cc of Lidocaine 1% and Depo-Medrol 20 mg   Signed,  Emila Steinhauser T. Jeremias Broyhill, MD   Patient's Medications  New Prescriptions   No medications on file  Previous Medications   BIOTIN 2500 MCG CAPS    Take by mouth daily.   CHOLECALCIFEROL (VITAMIN D) 1000 UNITS TABLET    Take 1,000 Units by mouth daily.   CONJUGATED ESTROGENS (PREMARIN) VAGINAL CREAM    Place 1 Applicatorful vaginally daily.   ESOMEPRAZOLE (NEXIUM) 20 MG CAPSULE    Take  20 mg by mouth daily at 12 noon.   FINASTERIDE (PROPECIA) 1 MG TABLET    Take 1 tablet (1 mg total) by mouth daily.   IBUPROFEN (ADVIL,MOTRIN) 200 MG TABLET    Take 200 mg by mouth every 6 (six)  hours as needed.   MULTIPLE VITAMIN (MULTIVITAMIN) TABLET    Take 1 tablet by mouth daily.    Modified Medications   No medications on file  Discontinued Medications   No medications on file

## 2014-11-16 NOTE — Progress Notes (Signed)
Pre visit review using our clinic review tool, if applicable. No additional management support is needed unless otherwise documented below in the visit note. 

## 2015-02-01 ENCOUNTER — Encounter: Payer: Self-pay | Admitting: *Deleted

## 2015-02-19 ENCOUNTER — Ambulatory Visit
Admission: RE | Admit: 2015-02-19 | Discharge: 2015-02-19 | Disposition: A | Discharge status: Home / Self Care | Payer: PPO | Source: Ambulatory Visit | Attending: Family Medicine | Admitting: Family Medicine

## 2015-02-19 ENCOUNTER — Other Ambulatory Visit: Payer: Self-pay | Admitting: Family Medicine

## 2015-02-19 DIAGNOSIS — Z1231 Encounter for screening mammogram for malignant neoplasm of breast: Secondary | ICD-10-CM | POA: Insufficient documentation

## 2015-02-27 ENCOUNTER — Telehealth: Payer: Self-pay

## 2015-02-28 NOTE — Telephone Encounter (Signed)
Error

## 2015-03-06 ENCOUNTER — Encounter: Payer: Self-pay | Admitting: Internal Medicine

## 2015-03-20 ENCOUNTER — Encounter: Payer: Self-pay | Admitting: Family Medicine

## 2015-03-20 ENCOUNTER — Ambulatory Visit (INDEPENDENT_AMBULATORY_CARE_PROVIDER_SITE_OTHER): Payer: PPO | Admitting: Family Medicine

## 2015-03-20 VITALS — BP 150/90 | HR 100 | Temp 98.0°F | Wt 169.0 lb

## 2015-03-20 DIAGNOSIS — J4531 Mild persistent asthma with (acute) exacerbation: Secondary | ICD-10-CM | POA: Diagnosis not present

## 2015-03-20 MED ORDER — PREDNISONE 20 MG PO TABS: ORAL_TABLET | ORAL | Status: DC

## 2015-03-20 MED ORDER — ALBUTEROL SULFATE HFA 108 (90 BASE) MCG/ACT IN AERS: 2.0000 | INHALATION_SPRAY | Freq: Four times a day (QID) | RESPIRATORY_TRACT | Status: DC | PRN

## 2015-03-20 MED ORDER — AZITHROMYCIN 250 MG PO TABS: ORAL_TABLET | ORAL | Status: DC

## 2015-03-20 NOTE — Patient Instructions (Signed)
Complete steroid taper.  Restart allegra, plain.  Complete antibitoics. Use albuterol as needed for wheeze.  Follow BP at home, call if greater than 140/90 consistently.

## 2015-03-20 NOTE — Progress Notes (Signed)
   Subjective:    Patient ID: Dana Hayes, female    DOB: 10/22/1945, 69 y.o.   MRN: [id number]  Cough This is a new problem. The current episode started in the past 7 days. The problem has been gradually worsening. The cough is productive of sputum. Associated symptoms include a fever, headaches, nasal congestion, a sore throat, shortness of breath and wheezing. Pertinent negatives include no chills, ear pain or postnasal drip. Associated symptoms comments: Post tussive emesis, violent coughing  subjective fever yesterday, body ache. The symptoms are aggravated by lying down (HAS had issues with allergies this summer with corn foled and neighbor spraying chemicals.). Risk factors: nonsmoker. She has tried nothing (Has not had inhaler in a while, but she feels she neededs wto get back on. She uses allegra off and on, not recently.) for the symptoms. The treatment provided no relief. Her past medical history is significant for asthma and environmental allergies. There is no history of COPD or pneumonia.    Cough keeping her up at night.    Review of Systems  Constitutional: Positive for fever. Negative for chills.  HENT: Positive for sore throat. Negative for ear pain and postnasal drip.   Respiratory: Positive for cough, shortness of breath and wheezing.   Allergic/Immunologic: Positive for environmental allergies.  Neurological: Positive for headaches.       Objective:   Physical Exam  Constitutional: Vital signs are normal. She appears well-developed and well-nourished. She is cooperative.  Non-toxic appearance. She does not appear ill. No distress.  HENT:  Head: Normocephalic.  Right Ear: Hearing, tympanic membrane, external ear and ear canal normal. Tympanic membrane is not erythematous, not retracted and not bulging.  Left Ear: Hearing, tympanic membrane, external ear and ear canal normal. Tympanic membrane is not erythematous, not retracted and not bulging.  Nose: Mucosal edema and  rhinorrhea present. Right sinus exhibits no maxillary sinus tenderness and no frontal sinus tenderness. Left sinus exhibits no maxillary sinus tenderness and no frontal sinus tenderness.  Mouth/Throat: Uvula is midline, oropharynx is clear and moist and mucous membranes are normal.  Eyes: Conjunctivae, EOM and lids are normal. Pupils are equal, round, and reactive to light. Lids are everted and swept, no foreign bodies found.  Neck: Trachea normal and normal range of motion. Neck supple. Carotid bruit is not present. No thyroid mass and no thyromegaly present.  Cardiovascular: Normal rate, regular rhythm, S1 normal, S2 normal, normal heart sounds, intact distal pulses and normal pulses.  Exam reveals no gallop and no friction rub.   No murmur heard. Pulmonary/Chest: Effort normal. No tachypnea. No respiratory distress. She has no decreased breath sounds. She has wheezes. She has no rhonchi. She has no rales.  Scattered wheeze, constant cough  Neurological: She is alert.  Skin: Skin is warm, dry and intact. No rash noted.  Psychiatric: Her speech is normal and behavior is normal. Judgment normal. Her mood appears not anxious. Cognition and memory are normal. She does not exhibit a depressed mood.          Assessment & Plan:

## 2015-03-20 NOTE — Progress Notes (Signed)
Pre visit review using our clinic review tool, if applicable. No additional management support is needed unless otherwise documented below in the visit note. 

## 2015-03-20 NOTE — Assessment & Plan Note (Signed)
Treat with prednisone taper.  Trigger allergies and or bacteria.  Given recent increase in temp... And > 7 days of symptoms.. Cover with antibiotics.  Restart antihistamine as well.

## 2015-03-27 ENCOUNTER — Ambulatory Visit (INDEPENDENT_AMBULATORY_CARE_PROVIDER_SITE_OTHER): Payer: PPO | Admitting: Obstetrics & Gynecology

## 2015-03-27 ENCOUNTER — Encounter: Payer: Self-pay | Admitting: Obstetrics & Gynecology

## 2015-03-27 VITALS — BP 164/99 | HR 111 | Resp 16 | Ht 62.0 in | Wt 172.0 lb

## 2015-03-27 DIAGNOSIS — Z01419 Encounter for gynecological examination (general) (routine) without abnormal findings: Secondary | ICD-10-CM | POA: Diagnosis not present

## 2015-03-27 DIAGNOSIS — Z0000 Encounter for general adult medical examination without abnormal findings: Secondary | ICD-10-CM

## 2015-03-27 DIAGNOSIS — N958 Other specified menopausal and perimenopausal disorders: Secondary | ICD-10-CM | POA: Diagnosis not present

## 2015-03-27 MED ORDER — AMBULATORY NON FORMULARY MEDICATION: Status: DC

## 2015-03-27 NOTE — Progress Notes (Signed)
Subjective:    Dana Hayes is a 69 y.o. MW P2 (34 and 53 kids, 5 grands)  female who presents for an annual exam. The patient has no complaints today. The patient rarely  (husband has lots of health issues) sexually active. She would to try the compounded estrogen. GYN screening history: last pap: was normal. The patient wears seatbelts: yes. The patient participates in regular exercise: yes. Has the patient ever been transfused or tattooed?: no. The patient reports that there is not domestic violence in her life.   Menstrual History: OB History    No data available      Menarche age: 24  No LMP recorded. Patient has had a hysterectomy.    The following portions of the patient's history were reviewed and updated as appropriate: allergies, current medications, past family history, past medical history, past social history, past surgical history and problem list.  Review of Systems A comprehensive review of systems was negative. Married for 49 years! Retired from being a Banker for [employer].   Objective:    BP 164/99 mmHg  Pulse 111  Resp 16  Ht 5\' 2"  (1.575 m)  Wt 172 lb (78.019 kg)  BMI 31.45 kg/m2  General Appearance:    Alert, cooperative, no distress, appears stated age  Head:    Normocephalic, without obvious abnormality, atraumatic  Eyes:    PERRL, conjunctiva/corneas clear, EOM's intact, fundi    benign, both eyes  Ears:    Normal TM's and external ear canals, both ears  Nose:   Nares normal, septum midline, mucosa normal, no drainage    or sinus tenderness  Throat:   Lips, mucosa, and tongue normal; teeth and gums normal  Neck:   Supple, symmetrical, trachea midline, no adenopathy;    thyroid:  no enlargement/tenderness/nodules; no carotid   bruit or JVD  Back:     Symmetric, no curvature, ROM normal, no CVA tenderness  Lungs:     Clear to auscultation bilaterally, respirations unlabored  Chest Wall:    No tenderness or deformity   Heart:    Regular rate and  rhythm, S1 and S2 normal, no murmur, rub   or gallop  Breast Exam:    No tenderness, masses, or nipple abnormality  Abdomen:     Soft, non-tender, bowel sounds active all four quadrants,    no masses, no organomegaly  Genitalia:    Normal female without lesion, discharge or tenderness, moderate vulvovaginal atrophy, NSSA, NT, adnexa not palpable     Extremities:   Extremities normal, atraumatic, no cyanosis or edema  Pulses:   2+ and symmetric all extremities  Skin:   Skin color, texture, turgor normal, no rashes or lesions  Lymph nodes:   Cervical, supraclavicular, and axillary nodes normal  Neurologic:   CNII-XII intact, normal strength, sensation and reflexes    throughout  .    Assessment:    Healthy female exam.   V ve   Plan:     Breast self exam technique reviewed and patient encouraged to perform self-exam monthly. She will follow up with her primary care doctor.   Prescribe compounded estrogen

## 2015-03-27 NOTE — Addendum Note (Signed)
Addended by: Gretchen Short on: 03/27/2015 04:02 PM   Modules accepted: Orders

## 2015-04-02 ENCOUNTER — Encounter: Payer: Self-pay | Admitting: Family Medicine

## 2015-04-02 ENCOUNTER — Ambulatory Visit (INDEPENDENT_AMBULATORY_CARE_PROVIDER_SITE_OTHER): Payer: PPO | Admitting: Family Medicine

## 2015-04-02 VITALS — BP 154/90 | HR 91 | Temp 98.9°F | Ht 62.0 in | Wt 170.5 lb

## 2015-04-02 DIAGNOSIS — I10 Essential (primary) hypertension: Secondary | ICD-10-CM

## 2015-04-02 MED ORDER — HYDROCHLOROTHIAZIDE 12.5 MG PO TABS: 12.5000 mg | ORAL_TABLET | Freq: Every day | ORAL | Status: DC

## 2015-04-02 NOTE — Progress Notes (Signed)
Pre visit review using our clinic review tool, if applicable. No additional management support is needed unless otherwise documented below in the visit note. 

## 2015-04-02 NOTE — Progress Notes (Signed)
Dr. Frederico Hamman T. Murray Guzzetta, MD, [facility] [place], [address] Phone: [phone] Fax: [phone]  04/02/2015  Patient: Dana Hayes, MRN: [id number], DOB: 04-03-1946, 69 y.o.  Primary Physician:  Owens Loffler, MD  Chief Complaint: Hypertension and Cough  Subjective:   Dana Hayes is a 69 y.o. very pleasant female patient who presents with the following:  BP Readings from Last 3 Encounters:  04/02/15 154/90  03/27/15 164/99  03/20/15 150/90    HTN: We had stopped her meds as a trial some time ago, and BP has returned and remained high No CP, no sob. No HA.  BP Readings from Last 3 Encounters:  04/02/15 154/90  03/27/15 164/99  03/20/15 983/38    Basic Metabolic Panel:    Component Value Date/Time   NA 141 02/27/2014 0914   K 4.1 02/27/2014 0914   CL 107 02/27/2014 0914   CO2 27 02/27/2014 0914   BUN 15 02/27/2014 0914   CREATININE 0.7 02/27/2014 0914   GLUCOSE 107* 02/27/2014 0914   CALCIUM 9.1 02/27/2014 0914      Past Medical History, Surgical History, Social History, Family History, Problem List, Medications, and Allergies have been reviewed and updated if relevant.  Patient Active Problem List   Diagnosis Date Noted  . OTHER MALAISE AND FATIGUE 05/29/2010  . DIVERTICULOSIS OF COLON 12/28/2007  . HYPERLIPIDEMIA 10/21/2007  . HYPERTENSION 10/21/2007  . ALLERGIC RHINITIS 10/21/2007  . Mild persistent asthma with acute exacerbation in adult 10/21/2007  . GERD 10/21/2007  . OSTEOPENIA 10/21/2007  . NEPHROLITHIASIS, HX OF 10/21/2007    Past Medical History  Diagnosis Date  . Asthma   . GERD (gastroesophageal reflux disease)   . Allergy   . Hyperlipidemia   . Hypertension   . Obesity     Past Surgical History  Procedure Laterality Date  . Abdominal hysterectomy    . Lithotripsy       TIMES 2 1990    Social History   Social History  . Marital Status: Married    Spouse Name:  N/A  . Number of Children: N/A  . Years of Education: N/A   Occupational History  . buyer    Social History Main Topics  . Smoking status: Never Smoker   . Smokeless tobacco: Never Used  . Alcohol Use: Yes     Comment: occassionally a glass of wine or beer  . Drug Use: No  . Sexual Activity: Not on file   Other Topics Concern  . Not on file   Social History Narrative   REGULAR EXERCISE: YES- WALKS 3-4-DAYS A WEEK      Diet: no fast food, instead veggies, loves baking    Family History  Problem Relation Age of Onset  . Coronary artery disease Mother   . Diabetes Mother   . Coronary artery disease Brother   . Heart attack Brother   . Breast cancer Maternal Aunt     early 78s    Allergies  Allergen Reactions  . Codeine     REACTION: Nausea and vomiting  . Estradiol     REACTION: rash at site of administration    Medication list reviewed and updated in full in [place] [facility].   GEN: No acute illnesses, no fevers, chills. GI: No n/v/d, eating normally Pulm: No SOB Interactive and getting along well at home.  Otherwise, ROS is as per the HPI.  Objective:   BP 154/90  mmHg  Pulse 91  Temp(Src) 98.9 F (37.2 C) (Oral)  Ht 5\' 2"  (1.575 m)  Wt 170 lb 8 oz (77.338 kg)  BMI 31.18 kg/m2  GEN: WDWN, NAD, Non-toxic, A & O x 3 HEENT: Atraumatic, Normocephalic. Neck supple. No masses, No LAD. Ears and Nose: No external deformity. CV: RRR, No M/G/R. No JVD. No thrill. No extra heart sounds. PULM: CTA B, no wheezes, crackles, rhonchi. No retractions. No resp. distress. No accessory muscle use. EXTR: No c/c/e NEURO Normal gait.  PSYCH: Normally interactive. Conversant. Not depressed or anxious appearing.  Calm demeanor.   Laboratory and Imaging Data:  Assessment and Plan:   Essential hypertension  Get BP cuff, check every other day Resume HCTZ  Follow-up: call or mychart me with numbers in a few weeks  New Prescriptions   HYDROCHLOROTHIAZIDE  (HYDRODIURIL) 12.5 MG TABLET    Take 1 tablet (12.5 mg total) by mouth daily.   No orders of the defined types were placed in this encounter.    Signed,  Maud Deed. Kalianna Verbeke, MD   Patient's Medications  New Prescriptions   HYDROCHLOROTHIAZIDE (HYDRODIURIL) 12.5 MG TABLET    Take 1 tablet (12.5 mg total) by mouth daily.  Previous Medications   ALBUTEROL (PROVENTIL HFA;VENTOLIN HFA) 108 (90 BASE) MCG/ACT INHALER    Inhale 2 puffs into the lungs every 6 (six) hours as needed for wheezing or shortness of breath.   AMBULATORY NON FORMULARY MEDICATION    Medication Name: Compounded Estrogen 0.02 (Estradiol)  [place]   ASPIRIN 81 MG TABLET    Take 81 mg by mouth daily.   CHOLECALCIFEROL (VITAMIN D) 1000 UNITS TABLET    Take 1,000 Units by mouth daily.   ESOMEPRAZOLE (NEXIUM) 20 MG CAPSULE    Take 20 mg by mouth daily at 12 noon.   IBUPROFEN (ADVIL,MOTRIN) 200 MG TABLET    Take 200 mg by mouth every 6 (six) hours as needed.   MULTIPLE VITAMIN (MULTIVITAMIN) TABLET    Take 1 tablet by mouth daily.    Modified Medications   No medications on file  Discontinued Medications   No medications on file

## 2015-04-25 ENCOUNTER — Other Ambulatory Visit: Payer: Self-pay | Admitting: Family Medicine

## 2015-04-25 DIAGNOSIS — R5383 Other fatigue: Secondary | ICD-10-CM

## 2015-04-25 DIAGNOSIS — E559 Vitamin D deficiency, unspecified: Secondary | ICD-10-CM

## 2015-04-25 DIAGNOSIS — E785 Hyperlipidemia, unspecified: Secondary | ICD-10-CM

## 2015-04-25 DIAGNOSIS — E538 Deficiency of other specified B group vitamins: Secondary | ICD-10-CM

## 2015-05-02 ENCOUNTER — Other Ambulatory Visit (INDEPENDENT_AMBULATORY_CARE_PROVIDER_SITE_OTHER): Payer: PPO

## 2015-05-02 DIAGNOSIS — E785 Hyperlipidemia, unspecified: Secondary | ICD-10-CM | POA: Diagnosis not present

## 2015-05-02 DIAGNOSIS — R5383 Other fatigue: Secondary | ICD-10-CM

## 2015-05-02 DIAGNOSIS — E538 Deficiency of other specified B group vitamins: Secondary | ICD-10-CM | POA: Diagnosis not present

## 2015-05-02 DIAGNOSIS — E559 Vitamin D deficiency, unspecified: Secondary | ICD-10-CM

## 2015-05-02 LAB — CBC WITH DIFFERENTIAL/PLATELET
BASOS ABS: 0.1 10*3/uL (ref 0.0–0.1)
BASOS PCT: 0.7 % (ref 0.0–3.0)
Eosinophils Absolute: 0.1 10*3/uL (ref 0.0–0.7)
Eosinophils Relative: 1.6 % (ref 0.0–5.0)
HCT: 44.8 % (ref 36.0–46.0)
Hemoglobin: 15 g/dL (ref 12.0–15.0)
LYMPHS ABS: 3.8 10*3/uL (ref 0.7–4.0)
Lymphocytes Relative: 46 % (ref 12.0–46.0)
MCHC: 33.5 g/dL (ref 30.0–36.0)
MCV: 82.5 fl (ref 78.0–100.0)
MONOS PCT: 9.4 % (ref 3.0–12.0)
Monocytes Absolute: 0.8 10*3/uL (ref 0.1–1.0)
NEUTROS ABS: 3.5 10*3/uL (ref 1.4–7.7)
NEUTROS PCT: 42.3 % — AB (ref 43.0–77.0)
PLATELETS: 342 10*3/uL (ref 150.0–400.0)
RBC: 5.43 Mil/uL — ABNORMAL HIGH (ref 3.87–5.11)
RDW: 13.3 % (ref 11.5–15.5)
WBC: 8.2 10*3/uL (ref 4.0–10.5)

## 2015-05-02 LAB — BASIC METABOLIC PANEL
BUN: 15 mg/dL (ref 6–23)
CALCIUM: 9.5 mg/dL (ref 8.4–10.5)
CHLORIDE: 103 meq/L (ref 96–112)
CO2: 28 meq/L (ref 19–32)
CREATININE: 0.67 mg/dL (ref 0.40–1.20)
GFR: 93.2 mL/min (ref >60.00)
GLUCOSE: 119 mg/dL — AB (ref 70–99)
Potassium: 4.2 mEq/L (ref 3.5–5.1)
SODIUM: 138 meq/L (ref 135–145)

## 2015-05-02 LAB — HEPATIC FUNCTION PANEL
ALK PHOS: 59 U/L (ref 39–117)
ALT: 24 U/L (ref 0–35)
AST: 17 U/L (ref 0–37)
Albumin: 4.3 g/dL (ref 3.5–5.2)
BILIRUBIN TOTAL: 0.6 mg/dL (ref 0.2–1.2)
Bilirubin, Direct: 0.1 mg/dL (ref 0.0–0.3)
Total Protein: 6.7 g/dL (ref 6.0–8.3)

## 2015-05-02 LAB — VITAMIN B12: Vitamin B-12: 338 pg/mL (ref 211–911)

## 2015-05-02 LAB — LIPID PANEL
Cholesterol: 235 mg/dL — ABNORMAL HIGH (ref 0–200)
HDL: 50.8 mg/dL (ref >39.00)
LDL Cholesterol: 146 mg/dL — ABNORMAL HIGH (ref 0–99)
NONHDL: 184.14
Total CHOL/HDL Ratio: 5
Triglycerides: 193 mg/dL — ABNORMAL HIGH (ref 0.0–149.0)
VLDL: 38.6 mg/dL (ref 0.0–40.0)

## 2015-05-02 LAB — VITAMIN D 25 HYDROXY (VIT D DEFICIENCY, FRACTURES): VITD: 36.48 ng/mL (ref 30.00–100.00)

## 2015-05-02 LAB — TSH: TSH: 0.66 u[IU]/mL (ref 0.35–4.50)

## 2015-05-09 ENCOUNTER — Encounter: Payer: Self-pay | Admitting: Family Medicine

## 2015-05-09 ENCOUNTER — Ambulatory Visit (INDEPENDENT_AMBULATORY_CARE_PROVIDER_SITE_OTHER): Payer: PPO | Admitting: Family Medicine

## 2015-05-09 VITALS — BP 172/90 | HR 97 | Temp 98.7°F | Ht 60.75 in | Wt 171.5 lb

## 2015-05-09 DIAGNOSIS — Z0000 Encounter for general adult medical examination without abnormal findings: Secondary | ICD-10-CM

## 2015-05-09 MED ORDER — HYDROCHLOROTHIAZIDE 25 MG PO TABS: 25.0000 mg | ORAL_TABLET | Freq: Every day | ORAL | Status: DC

## 2015-05-09 NOTE — Progress Notes (Signed)
Dr. Frederico Hamman T. Leeya Rusconi, MD, [facility] and [facility] Medicine [place], [address] Phone: [phone] Fax: [phone]  05/09/2015  Patient: Dana Hayes, MRN: [id number], DOB: 11/12/1945, 69 y.o.  Primary Physician:  Owens Loffler, MD  Chief Complaint: Annual Exam  Subjective:   Dana Hayes is a 69 y.o. pleasant patient who presents with the following:  Health Maintenance Summary Reviewed and updated, unless pt declines services.  Tobacco History Reviewed. Non-smoker Alcohol: No concerns, no excessive use Exercise Habits: Some activity, rec at least 30 mins 5 times a week STD concerns: none Drug Use: None Birth control method: hyst Lumps or breast concerns: no Breast Cancer Family History: no  Health Maintenance  Topic Date Due  . Hepatitis C Screening  May 13, 1946  . DEXA SCAN  12/14/2012  . PNA vac Low Risk Adult (2 of 2 - PPSV23) 03/03/2015  . INFLUENZA VACCINE  03/04/2016  . MAMMOGRAM  02/18/2017  . COLONOSCOPY  12/27/2017  . TETANUS/TDAP  08/23/2022  . ZOSTAVAX  Completed    Immunization History  Administered Date(s) Administered  . Influenza Split 06/09/2011  . Influenza Whole 06/04/2008, 06/12/2009, 06/05/2010  . Influenza, High Dose Seasonal PF 05/07/2015  . Influenza,inj,Quad PF,36+ Mos 07/05/2013, 05/10/2014  . Pneumococcal Conjugate-13 03/02/2014  . Tdap 08/23/2012  . Zoster 07/11/2008   Patient Active Problem List   Diagnosis Date Noted  . DIVERTICULOSIS OF COLON 12/28/2007  . HYPERLIPIDEMIA 10/21/2007  . Essential hypertension 10/21/2007  . ALLERGIC RHINITIS 10/21/2007  . Mild persistent asthma with acute exacerbation in adult 10/21/2007  . GERD 10/21/2007  . OSTEOPENIA 10/21/2007  . NEPHROLITHIASIS, HX OF 10/21/2007   Past Medical History  Diagnosis Date  . Asthma   . GERD (gastroesophageal reflux disease)   . Allergy   . Hyperlipidemia   . Hypertension   . Obesity    Past Surgical History    Procedure Laterality Date  . Abdominal hysterectomy    . Lithotripsy       TIMES 2 1990   Social History   Social History  . Marital Status: Married    Spouse Name: N/A  . Number of Children: N/A  . Years of Education: N/A   Occupational History  . buyer    Social History Main Topics  . Smoking status: Never Smoker   . Smokeless tobacco: Never Used  . Alcohol Use: Yes     Comment: occassionally a glass of wine or beer  . Drug Use: No  . Sexual Activity: Not on file   Other Topics Concern  . Not on file   Social History Narrative   REGULAR EXERCISE: YES- WALKS 3-4-DAYS A WEEK      Diet: no fast food, instead veggies, loves baking   Family History  Problem Relation Age of Onset  . Coronary artery disease Mother   . Diabetes Mother   . Coronary artery disease Brother   . Heart attack Brother   . Breast cancer Maternal Aunt     early 52s   Allergies  Allergen Reactions  . Codeine     REACTION: Nausea and vomiting  . Estradiol     REACTION: rash at site of administration    Medication list has been reviewed and updated.   General: Denies fever, chills, sweats. No significant weight loss. Eyes: Denies blurring,significant itching ENT: Denies earache, sore throat, and hoarseness.  Cardiovascular: Denies chest pains, palpitations, dyspnea on exertion,  Respiratory: Denies cough, dyspnea at  rest,wheeezing Breast: no concerns about lumps GI: Denies nausea, vomiting, diarrhea, constipation, change in bowel habits, abdominal pain, melena, hematochezia GU: Denies dysuria, hematuria, urinary hesitancy, nocturia, denies STD risk, no concerns about discharge Musculoskeletal: Denies back pain, joint pain Derm: Denies rash, itching Neuro: Denies  paresthesias, frequent falls, frequent headaches Psych: Denies depression, anxiety Endocrine: Denies cold intolerance, heat intolerance, polydipsia Heme: Denies enlarged lymph nodes Allergy: No hayfever  Objective:   BP  172/90 mmHg  Pulse 97  Temp(Src) 98.7 F (37.1 C) (Oral)  Ht 5' 0.75" (1.543 m)  Wt 171 lb 8 oz (77.792 kg)  BMI 32.67 kg/m2  Hearing Screening   Method: Audiometry   125Hz  250Hz  500Hz  1000Hz  2000Hz  4000Hz  8000Hz   Right ear:   25 25 25  0   Left ear:   25 25 25 25      Visual Acuity Screening   Right eye Left eye Both eyes  Without correction: 20/25 20/25 20/25   With correction:       GEN: well developed, well nourished, no acute distress Eyes: conjunctiva and lids normal, PERRLA, EOMI ENT: TM clear, nares clear, oral exam WNL Neck: supple, no lymphadenopathy, no thyromegaly, no JVD Pulm: clear to auscultation and percussion, respiratory effort normal CV: regular rate and rhythm, S1-S2, no murmur, rub or gallop, no bruits Chest: no scars, masses, no lumps BREAST: breast exam declined GI: soft, non-tender; no hepatosplenomegaly, masses; active bowel sounds all quadrants GU: GU exam declined Lymph: no cervical, axillary or inguinal adenopathy MSK: gait normal, muscle tone and strength WNL, no joint swelling, effusions, discoloration, crepitus  SKIN: clear, good turgor, color WNL, no rashes, lesions, or ulcerations Neuro: normal mental status, normal strength, sensation, and motion Psych: alert; oriented to person, place and time, normally interactive and not anxious or depressed in appearance.   All labs reviewed with patient. Lipids:    Component Value Date/Time   CHOL 235* 05/02/2015 0913   TRIG 193.0* 05/02/2015 0913   HDL 50.80 05/02/2015 0913   LDLDIRECT 162.6 05/29/2010 0829   VLDL 38.6 05/02/2015 0913   CHOLHDL 5 05/02/2015 0913   CBC: CBC Latest Ref Rng 05/02/2015 02/27/2014 08/16/2012  WBC 4.0 - 10.5 K/uL 8.2 7.8 7.8  Hemoglobin 12.0 - 15.0 g/dL 15.0 14.2 13.9  Hematocrit 36.0 - 46.0 % 44.8 43.0 41.8  Platelets 150.0 - 400.0 K/uL 342.0 337.0 325.4    Basic Metabolic Panel:    Component Value Date/Time   NA 138 05/02/2015 0913   K 4.2 05/02/2015 0913   CL  103 05/02/2015 0913   CO2 28 05/02/2015 0913   BUN 15 05/02/2015 0913   CREATININE 0.67 05/02/2015 0913   GLUCOSE 119* 05/02/2015 0913   CALCIUM 9.5 05/02/2015 0913   Hepatic Function Latest Ref Rng 05/02/2015 02/27/2014 08/16/2012  Total Protein 6.0 - 8.3 g/dL 6.7 6.5 6.0  Albumin 3.5 - 5.2 g/dL 4.3 4.0 3.7  AST 0 - 37 U/L 17 24 17   ALT 0 - 35 U/L 24 29 22   Alk Phosphatase 39 - 117 U/L 59 56 36(L)  Total Bilirubin 0.2 - 1.2 mg/dL 0.6 0.7 0.6  Bilirubin, Direct 0.0 - 0.3 mg/dL 0.1 0.0 0.1    Lab Results  Component Value Date   TSH 0.66 05/02/2015   No results found.  Assessment and Plan:   Routine general medical examination at a health care facility  Health Maintenance Exam: The patient's preventative maintenance and recommended screening tests for an annual wellness exam were reviewed in full today. Brought  up to date unless services declined.  Counselled on the importance of diet, exercise, and its role in overall health and mortality. The patient's FH and SH was reviewed, including their home life, tobacco status, and drug and alcohol status.  Increase BP meds Work on diet, exercise, weight loss  Follow-up: No Follow-up on file. Or follow-up in 1 year for complete physical examination  Signed,  Frederico Hamman T. Ryana Montecalvo, MD   Patient's Medications  New Prescriptions   No medications on file  Previous Medications   ALBUTEROL (PROVENTIL HFA;VENTOLIN HFA) 108 (90 BASE) MCG/ACT INHALER    Inhale 2 puffs into the lungs every 6 (six) hours as needed for wheezing or shortness of breath.   AMBULATORY NON FORMULARY MEDICATION    Medication Name: Compounded Estrogen 0.02 (Estradiol)  [place]   ASPIRIN 81 MG TABLET    Take 81 mg by mouth daily.   CHOLECALCIFEROL (VITAMIN D) 1000 UNITS TABLET    Take 1,000 Units by mouth daily.   ESOMEPRAZOLE (NEXIUM) 20 MG CAPSULE    Take 20 mg by mouth daily at 12 noon.   IBUPROFEN (ADVIL,MOTRIN) 200 MG TABLET    Take 200 mg by mouth  every 6 (six) hours as needed.   MULTIPLE VITAMIN (MULTIVITAMIN) TABLET    Take 1 tablet by mouth daily.    Modified Medications   Modified Medication Previous Medication   HYDROCHLOROTHIAZIDE (HYDRODIURIL) 25 MG TABLET hydrochlorothiazide (HYDRODIURIL) 12.5 MG tablet      Take 1 tablet (25 mg total) by mouth daily.    Take 1 tablet (12.5 mg total) by mouth daily.  Discontinued Medications   No medications on file

## 2015-05-09 NOTE — Patient Instructions (Signed)
Check your blood pressure at home.  I want it to be less than 130/80.  If it is above that, over time, then f/u in about 2 months

## 2015-05-09 NOTE — Progress Notes (Signed)
Pre visit review using our clinic review tool, if applicable. No additional management support is needed unless otherwise documented below in the visit note. 

## 2015-09-21 ENCOUNTER — Other Ambulatory Visit: Payer: Self-pay | Admitting: Family Medicine

## 2015-09-21 NOTE — Telephone Encounter (Signed)
Last office visit 05/09/2015.  Not on current medication list.  Refill?

## 2016-01-03 DIAGNOSIS — M79674 Pain in right toe(s): Secondary | ICD-10-CM | POA: Diagnosis not present

## 2016-01-03 DIAGNOSIS — B351 Tinea unguium: Secondary | ICD-10-CM | POA: Diagnosis not present

## 2016-01-03 DIAGNOSIS — M79675 Pain in left toe(s): Secondary | ICD-10-CM | POA: Diagnosis not present

## 2016-02-27 ENCOUNTER — Ambulatory Visit (INDEPENDENT_AMBULATORY_CARE_PROVIDER_SITE_OTHER): Payer: PPO | Admitting: Family Medicine

## 2016-02-27 ENCOUNTER — Encounter: Payer: Self-pay | Admitting: Family Medicine

## 2016-02-27 VITALS — BP 177/97 | HR 89 | Temp 98.5°F | Ht 60.75 in | Wt 167.5 lb

## 2016-02-27 DIAGNOSIS — M549 Dorsalgia, unspecified: Secondary | ICD-10-CM | POA: Diagnosis not present

## 2016-02-27 MED ORDER — TIZANIDINE HCL 4 MG PO TABS: 4.0000 mg | ORAL_TABLET | Freq: Every day | ORAL | 2 refills | Status: AC

## 2016-02-27 MED ORDER — TRAMADOL HCL 50 MG PO TABS: 50.0000 mg | ORAL_TABLET | Freq: Four times a day (QID) | ORAL | 1 refills | Status: AC | PRN

## 2016-02-27 NOTE — Progress Notes (Signed)
Pre visit review using our clinic review tool, if applicable. No additional management support is needed unless otherwise documented below in the visit note. 
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
## 2016-03-18 ENCOUNTER — Other Ambulatory Visit: Payer: Self-pay | Admitting: Family Medicine

## 2016-03-18 DIAGNOSIS — Z1231 Encounter for screening mammogram for malignant neoplasm of breast: Secondary | ICD-10-CM

## 2016-04-02 DIAGNOSIS — X32XXXA Exposure to sunlight, initial encounter: Secondary | ICD-10-CM | POA: Diagnosis not present

## 2016-04-02 DIAGNOSIS — L821 Other seborrheic keratosis: Secondary | ICD-10-CM | POA: Diagnosis not present

## 2016-04-02 DIAGNOSIS — D1801 Hemangioma of skin and subcutaneous tissue: Secondary | ICD-10-CM | POA: Diagnosis not present

## 2016-04-02 DIAGNOSIS — L565 Disseminated superficial actinic porokeratosis (DSAP): Secondary | ICD-10-CM | POA: Diagnosis not present

## 2016-04-09 ENCOUNTER — Ambulatory Visit
Admission: RE | Admit: 2016-04-09 | Discharge: 2016-04-09 | Disposition: A | Discharge status: Home / Self Care | Payer: PPO | Source: Ambulatory Visit | Attending: Family Medicine | Admitting: Family Medicine

## 2016-04-09 DIAGNOSIS — R928 Other abnormal and inconclusive findings on diagnostic imaging of breast: Secondary | ICD-10-CM | POA: Insufficient documentation

## 2016-04-09 DIAGNOSIS — Z1231 Encounter for screening mammogram for malignant neoplasm of breast: Secondary | ICD-10-CM

## 2016-04-10 ENCOUNTER — Other Ambulatory Visit: Payer: Self-pay | Admitting: Family Medicine

## 2016-04-10 DIAGNOSIS — N6489 Other specified disorders of breast: Secondary | ICD-10-CM

## 2016-04-21 ENCOUNTER — Ambulatory Visit
Admission: RE | Admit: 2016-04-21 | Discharge: 2016-04-21 | Disposition: A | Discharge status: Home / Self Care | Payer: PPO | Source: Ambulatory Visit | Attending: Family Medicine | Admitting: Family Medicine

## 2016-04-21 DIAGNOSIS — N6489 Other specified disorders of breast: Secondary | ICD-10-CM | POA: Diagnosis not present

## 2016-04-21 DIAGNOSIS — R928 Other abnormal and inconclusive findings on diagnostic imaging of breast: Secondary | ICD-10-CM | POA: Diagnosis not present

## 2016-05-05 ENCOUNTER — Other Ambulatory Visit: Payer: Self-pay | Admitting: Family Medicine

## 2016-05-05 DIAGNOSIS — Z1159 Encounter for screening for other viral diseases: Secondary | ICD-10-CM

## 2016-05-05 DIAGNOSIS — E538 Deficiency of other specified B group vitamins: Secondary | ICD-10-CM

## 2016-05-05 DIAGNOSIS — E559 Vitamin D deficiency, unspecified: Secondary | ICD-10-CM

## 2016-05-05 DIAGNOSIS — R5383 Other fatigue: Secondary | ICD-10-CM

## 2016-05-05 DIAGNOSIS — E7849 Other hyperlipidemia: Secondary | ICD-10-CM

## 2016-05-09 ENCOUNTER — Other Ambulatory Visit (INDEPENDENT_AMBULATORY_CARE_PROVIDER_SITE_OTHER): Payer: PPO

## 2016-05-09 ENCOUNTER — Ambulatory Visit (INDEPENDENT_AMBULATORY_CARE_PROVIDER_SITE_OTHER): Payer: PPO

## 2016-05-09 ENCOUNTER — Telehealth: Payer: Self-pay | Admitting: Family Medicine

## 2016-05-09 VITALS — BP 168/100 | HR 82 | Temp 98.0°F | Ht 60.5 in | Wt 166.5 lb

## 2016-05-09 DIAGNOSIS — Z23 Encounter for immunization: Secondary | ICD-10-CM | POA: Diagnosis not present

## 2016-05-09 DIAGNOSIS — E2839 Other primary ovarian failure: Secondary | ICD-10-CM

## 2016-05-09 DIAGNOSIS — Z0000 Encounter for general adult medical examination without abnormal findings: Secondary | ICD-10-CM | POA: Diagnosis not present

## 2016-05-09 DIAGNOSIS — R5383 Other fatigue: Secondary | ICD-10-CM | POA: Diagnosis not present

## 2016-05-09 DIAGNOSIS — Z1159 Encounter for screening for other viral diseases: Secondary | ICD-10-CM | POA: Diagnosis not present

## 2016-05-09 DIAGNOSIS — E559 Vitamin D deficiency, unspecified: Secondary | ICD-10-CM

## 2016-05-09 DIAGNOSIS — E7849 Other hyperlipidemia: Secondary | ICD-10-CM

## 2016-05-09 DIAGNOSIS — E538 Deficiency of other specified B group vitamins: Secondary | ICD-10-CM | POA: Diagnosis not present

## 2016-05-09 DIAGNOSIS — E784 Other hyperlipidemia: Secondary | ICD-10-CM | POA: Diagnosis not present

## 2016-05-09 LAB — CBC WITH DIFFERENTIAL/PLATELET
BASOS ABS: 0.1 10*3/uL (ref 0.0–0.1)
BASOS PCT: 1.2 % (ref 0.0–3.0)
EOS ABS: 0.1 10*3/uL (ref 0.0–0.7)
Eosinophils Relative: 1.4 % (ref 0.0–5.0)
HCT: 44.5 % (ref 36.0–46.0)
HEMOGLOBIN: 15.1 g/dL — AB (ref 12.0–15.0)
LYMPHS PCT: 42.9 % (ref 12.0–46.0)
Lymphs Abs: 3.1 10*3/uL (ref 0.7–4.0)
MCHC: 33.9 g/dL (ref 30.0–36.0)
MCV: 82.2 fl (ref 78.0–100.0)
MONO ABS: 0.7 10*3/uL (ref 0.1–1.0)
Monocytes Relative: 9.9 % (ref 3.0–12.0)
NEUTROS ABS: 3.3 10*3/uL (ref 1.4–7.7)
Neutrophils Relative %: 44.6 % (ref 43.0–77.0)
Platelets: 337 10*3/uL (ref 150.0–400.0)
RBC: 5.42 Mil/uL — ABNORMAL HIGH (ref 3.87–5.11)
RDW: 13.2 % (ref 11.5–15.5)
WBC: 7.3 10*3/uL (ref 4.0–10.5)

## 2016-05-09 LAB — BASIC METABOLIC PANEL
BUN: 16 mg/dL (ref 6–23)
CALCIUM: 9.4 mg/dL (ref 8.4–10.5)
CO2: 26 meq/L (ref 19–32)
Chloride: 104 mEq/L (ref 96–112)
Creatinine, Ser: 0.71 mg/dL (ref 0.40–1.20)
GFR: 86.91 mL/min (ref >60.00)
Glucose, Bld: 118 mg/dL — ABNORMAL HIGH (ref 70–99)
POTASSIUM: 3.9 meq/L (ref 3.5–5.1)
SODIUM: 139 meq/L (ref 135–145)

## 2016-05-09 LAB — VITAMIN D 25 HYDROXY (VIT D DEFICIENCY, FRACTURES): VITD: 34.58 ng/mL (ref 30.00–100.00)

## 2016-05-09 LAB — LIPID PANEL
CHOL/HDL RATIO: 5
CHOLESTEROL: 250 mg/dL — AB (ref 0–200)
HDL: 53.5 mg/dL (ref >39.00)
LDL CALC: 172 mg/dL — AB (ref 0–99)
NonHDL: 196.33
Triglycerides: 123 mg/dL (ref 0.0–149.0)
VLDL: 24.6 mg/dL (ref 0.0–40.0)

## 2016-05-09 LAB — HEPATIC FUNCTION PANEL
ALBUMIN: 4.1 g/dL (ref 3.5–5.2)
ALK PHOS: 55 U/L (ref 39–117)
ALT: 23 U/L (ref 0–35)
AST: 18 U/L (ref 0–37)
BILIRUBIN DIRECT: 0 mg/dL (ref 0.0–0.3)
TOTAL PROTEIN: 6.8 g/dL (ref 6.0–8.3)
Total Bilirubin: 0.5 mg/dL (ref 0.2–1.2)

## 2016-05-09 LAB — VITAMIN B12: VITAMIN B 12: 333 pg/mL (ref 211–911)

## 2016-05-09 LAB — TSH: TSH: 0.45 u[IU]/mL (ref 0.35–4.50)

## 2016-05-09 MED ORDER — LISINOPRIL-HYDROCHLOROTHIAZIDE 20-25 MG PO TABS: 1.0000 | ORAL_TABLET | Freq: Every day | ORAL | 11 refills | Status: DC

## 2016-05-09 NOTE — Progress Notes (Signed)
Pre visit review using our clinic review tool, if applicable. No additional management support is needed unless otherwise documented below in the visit note. 

## 2016-05-09 NOTE — Progress Notes (Signed)
Subjective:   Dana Hayes is a 70 y.o. female who presents for Medicare Annual (Subsequent) preventive examination.  Review of Systems:  N/A Cardiac Risk Factors include: advanced age (>64men, >58 women);hypertension;dyslipidemia     Objective:     Vitals: BP (!) 168/100 (BP Location: Right Arm, Patient Position: Sitting, Cuff Size: Normal) Comment: AM BP meds not taken  Pulse 82   Temp 98 F (36.7 C) (Oral)   Ht 5' 0.5" (1.537 m) Comment: no shoes  Wt 166 lb 8 oz (75.5 kg)   SpO2 93%   BMI 31.98 kg/m   Body mass index is 31.98 kg/m.   Tobacco History  Smoking Status  . Never Smoker  Smokeless Tobacco  . Never Used     Counseling given: No   Past Medical History:  Diagnosis Date  . Allergy   . Asthma   . GERD (gastroesophageal reflux disease)   . Hyperlipidemia   . Hypertension   . Obesity    Past Surgical History:  Procedure Laterality Date  . ABDOMINAL HYSTERECTOMY    . LITHOTRIPSY      TIMES 2 1990   Family History  Problem Relation Age of Onset  . Coronary artery disease Mother   . Diabetes Mother   . Coronary artery disease Brother   . Heart attack Brother   . Breast cancer Maternal Aunt     early 42s   History  Sexual Activity  . Sexual activity: Yes    Outpatient Encounter Prescriptions as of 05/09/2016  Medication Sig  . albuterol (PROVENTIL HFA;VENTOLIN HFA) 108 (90 BASE) MCG/ACT inhaler Inhale 2 puffs into the lungs every 6 (six) hours as needed for wheezing or shortness of breath.  . AMBULATORY NON FORMULARY MEDICATION Medication Name: Compounded Estrogen 0.02 (Estradiol)  [employer]  . aspirin 81 MG tablet Take 81 mg by mouth daily.  . cholecalciferol (VITAMIN D) 1000 UNITS tablet Take 1,000 Units by mouth daily.  Marland Kitchen esomeprazole (NEXIUM) 20 MG capsule Take 20 mg by mouth daily at 12 noon.  . finasteride (PROPECIA) 1 MG tablet TAKE ONE TABLET BY MOUTH EVERY DAY  . hydrochlorothiazide (HYDRODIURIL) 25 MG tablet Take 1  tablet (25 mg total) by mouth daily.  Marland Kitchen ibuprofen (ADVIL,MOTRIN) 200 MG tablet Take 200 mg by mouth every 6 (six) hours as needed.  . Multiple Vitamin (MULTIVITAMIN) tablet Take 1 tablet by mouth daily.     No facility-administered encounter medications on file as of 05/09/2016.     Activities of Daily Living In your present state of health, do you have any difficulty performing the following activities: 05/09/2016  Hearing? N  Vision? N  Difficulty concentrating or making decisions? N  Walking or climbing stairs? N  Dressing or bathing? N  Doing errands, shopping? N  Preparing Food and eating ? N  Using the Toilet? N  In the past six months, have you accidently leaked urine? N  Do you have problems with loss of bowel control? N  Managing your Medications? N  Managing your Finances? N  Housekeeping or managing your Housekeeping? N  Some recent data might be hidden    Patient Care Team: Owens Loffler, MD as PCP - General Oneta Rack, MD as Consulting Physician (Dermatology)    Assessment:     Hearing Screening   125Hz  250Hz  500Hz  1000Hz  2000Hz  3000Hz  4000Hz  6000Hz  8000Hz   Right ear:   40 40 40  0    Left ear:   40 40  40  40      Visual Acuity Screening   Right eye Left eye Both eyes  Without correction:     With correction: 20/25 20/25 20/25     Exercise Activities and Dietary recommendations Current Exercise Habits: Structured exercise class, Type of exercise: strength training/weights;Other - see comments;treadmill;walking (cardio), Time (Minutes): 60, Frequency (Times/Week): 3, Weekly Exercise (Minutes/Week): 180, Intensity: Moderate, Exercise limited by: None identified  Goals    . Increase physical activity          Starting 05/09/2016, I will continue to exercise at least 60 min 3 days per week.       Fall Risk Fall Risk  05/09/2016 05/09/2015 03/02/2014  Falls in the past year? No No No   Depression Screen PHQ 2/9 Scores 05/09/2016 05/09/2015 03/02/2014    PHQ - 2 Score 0 0 0     Cognitive Testing MMSE - Mini Mental State Exam 05/09/2016  Orientation to time 5  Orientation to Place 5  Registration 3  Attention/ Calculation 0  Recall 3  Language- name 2 objects 0  Language- repeat 1  Language- follow 3 step command 3  Language- read & follow direction 0  Write a sentence 0  Copy design 0  Total score 20   PLEASE NOTE: A Mini-Cog screen was completed. Maximum score is 20. A value of 0 denotes this part of [place] MMSE was not completed or the patient failed this part of the Mini-Cog screening.   Mini-Cog Screening Orientation to Time - Max 5 pts Orientation to Place - Max 5 pts Registration - Max 3 pts Recall - Max 3 pts Language Repeat - Max 1 pts Language Follow 3 Step Command - Max 3 pts   Immunization History  Administered Date(s) Administered  . Influenza Split 06/09/2011  . Influenza Whole 06/04/2008, 06/12/2009, 06/05/2010  . Influenza, High Dose Seasonal PF 05/07/2015  . Influenza,inj,Quad PF,36+ Mos 07/05/2013, 05/10/2014, 05/09/2016  . Pneumococcal Conjugate-13 03/02/2014  . Pneumococcal Polysaccharide-23 05/09/2016  . Tdap 08/23/2012  . Zoster 07/11/2008   Screening Tests Health Maintenance  Topic Date Due  . DEXA SCAN  08/03/2017 (Originally 12/14/2012)  . COLONOSCOPY  12/27/2017  . MAMMOGRAM  04/09/2018  . TETANUS/TDAP  08/23/2022  . INFLUENZA VACCINE  Addressed  . ZOSTAVAX  Completed  . Hepatitis C Screening  Completed  . PNA vac Low Risk Adult  Completed      Plan:     I have personally reviewed and addressed the Medicare Annual Wellness questionnaire and have noted the following in the patient's chart:  A. Medical and social history B. Use of alcohol, tobacco or illicit drugs  C. Current medications and supplements D. Functional ability and status E.  Nutritional status F.  Physical activity G. Advance directives H. List of other physicians I.  Hospitalizations, surgeries, and ER visits in  previous 12 months J.  [place] to include hearing, vision, cognitive, depression L. Referrals and appointments - none  In addition, I have reviewed and discussed with patient certain preventive protocols, quality metrics, and best practice recommendations. A written personalized care plan for preventive services as well as general preventive health recommendations were provided to patient.  See attached scanned questionnaire for additional information.   Signed,   Lindell Noe, MHA, BS, LPN Health Advisor

## 2016-05-09 NOTE — Telephone Encounter (Signed)
-----   Message from Eustace Pen, LPN sent at [phone]  9:54 AM EDT ----- Regarding: Elevated BP Dr. Lorelei Pont,  Pt's BP was elevated. 168/100. Pt states she has not taken AM BP medication. However, pt states her BP is always elevated when taking it at home. Pt was provided information on the DASH diet from the [facility]. Dr. Diona Browner was consulted about pt's BP.   Katha Cabal

## 2016-05-09 NOTE — Telephone Encounter (Signed)
Ms. Amor notified as instructed by telephone.  She is scheduled to see Dr. Lorelei Pont on 05/14/2016 at 8:30 am.

## 2016-05-09 NOTE — Patient Instructions (Signed)
Dana Hayes , Thank you for taking time to come for your Medicare Wellness Visit. I appreciate your ongoing commitment to your health goals. Please review the following plan we discussed and let me know if I can assist you in the future.   These are the goals we discussed: Goals    . Increase physical activity          Starting 05/09/2016, I will continue to exercise at least 60 min 3 days per week.        This is a list of the screening recommended for you and due dates:  Health Maintenance  Topic Date Due  . DEXA scan (bone density measurement)  08/03/2017*  . Colon Cancer Screening  12/27/2017  . Mammogram  04/09/2018  . Tetanus Vaccine  08/23/2022  . Flu Shot  Addressed  . Shingles Vaccine  Completed  .  Hepatitis C: One time screening is recommended by Center for Disease Control  (CDC) for  adults born from [birth year] through 1965.   Completed  . Pneumonia vaccines  Completed  *Topic was postponed. The date shown is not the original due date.   Preventive Care for Adults  A healthy lifestyle and preventive care can promote health and wellness. Preventive health guidelines for adults include the following key practices.  . A routine yearly physical is a good way to check with your health care provider about your health and preventive screening. It is a chance to share any concerns and updates on your health and to receive a thorough exam.  . Visit your dentist for a routine exam and preventive care every 6 months. Brush your teeth twice a day and floss once a day. Good oral hygiene prevents tooth decay and gum disease.  . The frequency of eye exams is based on your age, health, family medical history, use  of contact lenses, and other factors. Follow your health care provider's ecommendations for frequency of eye exams.  . Eat a healthy diet. Foods like vegetables, fruits, whole grains, low-fat dairy products, and lean protein foods contain the nutrients you need without too many  calories. Decrease your intake of foods high in solid fats, added sugars, and salt. Eat the right amount of calories for you. Get information about a proper diet from your health care provider, if necessary.  . Regular physical exercise is one of the most important things you can do for your health. Most adults should get at least 150 minutes of moderate-intensity exercise (any activity that increases your heart rate and causes you to sweat) each week. In addition, most adults need muscle-strengthening exercises on 2 or more days a week.  Silver Sneakers may be a benefit available to you. To determine eligibility, you may visit the website: www.silversneakers.com or contact program at 8564372169 Mon-Fri between 8AM-8PM.   . Maintain a healthy weight. The body mass index (BMI) is a screening tool to identify possible weight problems. It provides an estimate of body fat based on height and weight. Your health care provider can find your BMI and can help you achieve or maintain a healthy weight.   For adults 20 years and older: ? A BMI below 18.5 is considered underweight. ? A BMI of 18.5 to 24.9 is normal. ? A BMI of 25 to 29.9 is considered overweight. ? A BMI of 30 and above is considered obese.   . Maintain normal blood lipids and cholesterol levels by exercising and minimizing your intake of saturated fat. Eat a  balanced diet with plenty of fruit and vegetables. Blood tests for lipids and cholesterol should begin at age 44 and be repeated every 5 years. If your lipid or cholesterol levels are high, you are over 50, or you are at high risk for heart disease, you may need your cholesterol levels checked more frequently. Ongoing high lipid and cholesterol levels should be treated with medicines if diet and exercise are not working.  . If you smoke, find out from your health care provider how to quit. If you do not use tobacco, please do not start.  . If you choose to drink alcohol, please do  not consume more than 2 drinks per day. One drink is considered to be 12 ounces (355 mL) of beer, 5 ounces (148 mL) of wine, or 1.5 ounces (44 mL) of liquor.  . If you are 29-15 years old, ask your health care provider if you should take aspirin to prevent strokes.  . Use sunscreen. Apply sunscreen liberally and repeatedly throughout the day. You should seek shade when your shadow is shorter than you. Protect yourself by wearing long sleeves, pants, a wide-brimmed hat, and sunglasses year round, whenever you are outdoors.  . Once a month, do a whole body skin exam, using a mirror to look at the skin on your back. Tell your health care provider of new moles, moles that have irregular borders, moles that are larger than a pencil eraser, or moles that have changed in shape or color.

## 2016-05-09 NOTE — Telephone Encounter (Signed)
PCP has not responded yet to note of increased BP.  BP Readings from Last 3 Encounters:  05/09/16 (!) 168/100  02/27/16 (!) 177/97  05/09/15 (!) 172/90   Reviewed chart BP has been elevated for a while. Nml cr.  Stop HCTZ and change to combo lisinopril HCTZ. Rx sent to pharmacy.  HAve pt follow and record BPs t home to bring to appt. Make sure pt has appt in approximately 2 weeks to check BP and kidney function.

## 2016-05-09 NOTE — Progress Notes (Signed)
PCP notes:   Health maintenance:  Flu vaccine - administered PPSV23 - administered Bone density - ordered Hep C screening - completed  Abnormal screenings:   Hearing - failed  Patient concerns:   None  Nurse concerns:  Pt's BP was elevated. 168/100. Pt states she has not taken AM BP medication. However, pt states her BP is always elevated when taking it at home. Pt was provided information on the DASH diet from the [facility]. Dr. Diona Browner was consulted about pt's BP.   Next PCP appt:   05/14/16 @ 0830

## 2016-05-09 NOTE — Progress Notes (Signed)
I reviewed health advisor's note, was available for consultation, and agree with documentation and plan. Will address if PCP does not by end of the day.   Eliezer Lofts, MD [place] [facility]

## 2016-05-10 LAB — HEPATITIS C ANTIBODY: HCV AB: NEGATIVE

## 2016-05-11 NOTE — Telephone Encounter (Signed)
Will see in the next few days.

## 2016-05-14 ENCOUNTER — Encounter: Payer: Self-pay | Admitting: Family Medicine

## 2016-05-14 ENCOUNTER — Ambulatory Visit (INDEPENDENT_AMBULATORY_CARE_PROVIDER_SITE_OTHER): Payer: PPO | Admitting: Family Medicine

## 2016-05-14 VITALS — BP 142/78 | HR 96 | Temp 98.5°F | Ht 60.05 in | Wt 166.5 lb

## 2016-05-14 DIAGNOSIS — Z0000 Encounter for general adult medical examination without abnormal findings: Secondary | ICD-10-CM

## 2016-05-14 MED ORDER — AMBULATORY NON FORMULARY MEDICATION: 11 refills | Status: DC

## 2016-05-14 MED ORDER — TRAZODONE HCL 50 MG PO TABS: 25.0000 mg | ORAL_TABLET | Freq: Every evening | ORAL | 5 refills | Status: DC | PRN

## 2016-05-14 NOTE — Progress Notes (Signed)
Pre visit review using our clinic review tool, if applicable. No additional management support is needed unless otherwise documented below in the visit note. 

## 2016-05-14 NOTE — Progress Notes (Signed)
Dr. Frederico Hamman T. Lakela Kuba, MD, [facility] [place], [address] Phone: [phone] Fax: [phone]  05/14/2016  Patient: Dana Hayes, MRN: [id number], DOB: 09/12/1945, 70 y.o.  Primary Physician:  Owens Loffler, MD   Chief Complaint  Patient presents with  . Medicare Wellness    Part 2   Subjective:   Dana Hayes is a 70 y.o. pleasant patient who presents with the following:  Health Maintenance Summary Reviewed and updated, unless pt declines services.  Tobacco History Reviewed. Non-smoker Alcohol: No concerns, no excessive use Exercise Habits: Some activity, rec at least 30 mins 5 times a week STD concerns: none Drug Use: None Birth control method: hyst Menses regular: no Lumps or breast concerns: no Breast Cancer Family History: no  BP has normalized  Still does not sleep at night.  Yesterday woke up at 3:30 AM Wathced a couple of TV shows, then could not go back to bed.   Health Maintenance  Topic Date Due  . DEXA SCAN  08/03/2017 (Originally 12/14/2012)  . COLONOSCOPY  12/27/2017  . MAMMOGRAM  04/09/2018  . TETANUS/TDAP  08/23/2022  . INFLUENZA VACCINE  Addressed  . ZOSTAVAX  Completed  . Hepatitis C Screening  Completed  . PNA vac Low Risk Adult  Completed    Immunization History  Administered Date(s) Administered  . Influenza Split 06/09/2011  . Influenza Whole 06/04/2008, 06/12/2009, 06/05/2010  . Influenza, High Dose Seasonal PF 05/07/2015  . Influenza,inj,Quad PF,36+ Mos 07/05/2013, 05/10/2014, 05/09/2016  . Pneumococcal Conjugate-13 03/02/2014  . Pneumococcal Polysaccharide-23 05/09/2016  . Tdap 08/23/2012  . Zoster 07/11/2008   Patient Active Problem List   Diagnosis Date Noted  . DIVERTICULOSIS OF COLON 12/28/2007  . HYPERLIPIDEMIA 10/21/2007  . Essential hypertension 10/21/2007  . ALLERGIC RHINITIS 10/21/2007  . Mild persistent asthma with acute exacerbation in adult  10/21/2007  . GERD 10/21/2007  . OSTEOPENIA 10/21/2007  . NEPHROLITHIASIS, HX OF 10/21/2007   Past Medical History:  Diagnosis Date  . Allergy   . Asthma   . GERD (gastroesophageal reflux disease)   . Hyperlipidemia   . Hypertension   . Obesity    Past Surgical History:  Procedure Laterality Date  . ABDOMINAL HYSTERECTOMY    . LITHOTRIPSY      TIMES 2 1990   Social History   Social History  . Marital status: Married    Spouse name: N/A  . Number of children: N/A  . Years of education: N/A   Occupational History  . buyer [employer]   Social History Main Topics  . Smoking status: Never Smoker  . Smokeless tobacco: Never Used  . Alcohol use Yes     Comment: occassionally a glass of wine or beer  . Drug use: No  . Sexual activity: Yes   Other Topics Concern  . Not on file   Social History Narrative   REGULAR EXERCISE: YES- WALKS 3-4-DAYS A WEEK      Diet: no fast food, instead veggies, loves baking   Family History  Problem Relation Age of Onset  . Coronary artery disease Mother   . Diabetes Mother   . Coronary artery disease Brother   . Heart attack Brother   . Breast cancer Maternal Aunt     early 53s   Allergies  Allergen Reactions  . Codeine     REACTION: Nausea and vomiting  . Estradiol Rash    REACTION: rash at site of  administration, topical rash only    Medication list has been reviewed and updated.   General: Denies fever, chills, sweats. No significant weight loss. Eyes: Denies blurring,significant itching ENT: Denies earache, sore throat, and hoarseness.  Cardiovascular: Denies chest pains, palpitations, dyspnea on exertion,  Respiratory: Denies cough, dyspnea at rest,wheeezing Breast: no concerns about lumps GI: Denies nausea, vomiting, diarrhea, constipation, change in bowel habits, abdominal pain, melena, hematochezia GU: Denies dysuria, hematuria, urinary hesitancy, nocturia, denies STD risk, no concerns about  discharge Musculoskeletal: Denies back pain, joint pain Derm: Denies rash, itching Neuro: Denies  paresthesias, frequent falls, frequent headaches Psych: Denies depression, anxiety Endocrine: Denies cold intolerance, heat intolerance, polydipsia Heme: Denies enlarged lymph nodes Allergy: No hayfever  Objective:   BP (!) 142/78   Pulse 96   Temp 98.5 F (36.9 C) (Oral)   Ht 5' 0.05" (1.525 m)   Wt 166 lb 8 oz (75.5 kg)   BMI 32.46 kg/m  No exam data present  GEN: well developed, well nourished, no acute distress Eyes: conjunctiva and lids normal, PERRLA, EOMI ENT: TM clear, nares clear, oral exam WNL Neck: supple, no lymphadenopathy, no thyromegaly, no JVD Pulm: clear to auscultation and percussion, respiratory effort normal CV: regular rate and rhythm, S1-S2, no murmur, rub or gallop, no bruits Chest: no scars, masses, no lumps BREAST: breast exam declined GI: soft, non-tender; no hepatosplenomegaly, masses; active bowel sounds all quadrants GU: GU exam declined Lymph: no cervical, axillary or inguinal adenopathy MSK: gait normal, muscle tone and strength WNL, no joint swelling, effusions, discoloration, crepitus  SKIN: clear, good turgor, color WNL, no rashes, lesions, or ulcerations Neuro: normal mental status, normal strength, sensation, and motion Psych: alert; oriented to person, place and time, normally interactive and not anxious or depressed in appearance.   All labs reviewed with patient. Lipids:    Component Value Date/Time   CHOL 250 (H) 05/09/2016 0851   TRIG 123.0 05/09/2016 0851   HDL 53.50 05/09/2016 0851   LDLDIRECT 162.6 05/29/2010 0829   VLDL 24.6 05/09/2016 0851   CHOLHDL 5 05/09/2016 0851   CBC: CBC Latest Ref Rng & Units 05/09/2016 05/02/2015 02/27/2014  WBC 4.0 - 10.5 K/uL 7.3 8.2 7.8  Hemoglobin 12.0 - 15.0 g/dL 15.1(H) 15.0 14.2  Hematocrit 36.0 - 46.0 % 44.5 44.8 43.0  Platelets 150.0 - 400.0 K/uL 337.0 342.0 741.4    Basic Metabolic  Panel:    Component Value Date/Time   NA 139 05/09/2016 0851   K 3.9 05/09/2016 0851   CL 104 05/09/2016 0851   CO2 26 05/09/2016 0851   BUN 16 05/09/2016 0851   CREATININE 0.71 05/09/2016 0851   GLUCOSE 118 (H) 05/09/2016 0851   CALCIUM 9.4 05/09/2016 0851   Hepatic Function Latest Ref Rng & Units 05/09/2016 05/02/2015 02/27/2014  Total Protein 6.0 - 8.3 g/dL 6.8 6.7 6.5  Albumin 3.5 - 5.2 g/dL 4.1 4.3 4.0  AST 0 - 37 U/L _0 ALT 0 - 35 U/L _1 Alk Phosphatase 39 - 117 U/L 55 59 56  Total Bilirubin 0.2 - 1.2 mg/dL 0.5 0.6 0.7  Bilirubin, Direct 0.0 - 0.3 mg/dL 0.0 0.1 0.0    Lab Results  Component Value Date   TSH 0.45 05/09/2016   Mm Diag Breast Tomo Uni Right  Result Date: 04/21/2016 CLINICAL DATA:  Asymmetry right breast identified on recent 2D screening mammogram. EXAM: 2D DIGITAL DIAGNOSTIC UNILATERAL RIGHT MAMMOGRAM WITH CAD AND ADJUNCT TOMO COMPARISON:  04/09/2016 and  earlier priors ACR Breast Density Category b: There are scattered areas of fibroglandular density. FINDINGS: Whole breast and spot compression images including tomography show no persistent asymmetry, mass, or distortion. The area of concern on the recent screening mammogram is most consistent with overlap of fibroglandular tissue. Mammographic images were processed with CAD. IMPRESSION: No evidence of malignancy in the right breast. RECOMMENDATION: Screening mammogram in one year.(Code:SM-B-01Y) I have discussed the findings and recommendations with the patient. Results were also provided in writing at the conclusion of the visit. If applicable, a reminder letter will be sent to the patient regarding the next appointment. BI-RADS CATEGORY  1: Negative. Electronically Signed   By: Curlene Dolphin M.D.   On: 04/21/2016 14:02    Assessment and Plan:   Healthcare maintenance  Some insomnia - trial of trazodone O/w doing well  Health Maintenance Exam: The patient's preventative maintenance and  recommended screening tests for an annual wellness exam were reviewed in full today. Brought up to date unless services declined.  Counselled on the importance of diet, exercise, and its role in overall health and mortality. The patient's FH and SH was reviewed, including their home life, tobacco status, and drug and alcohol status.  Follow-up: No Follow-up on file. Or follow-up in 1 year for complete physical examination  New Prescriptions   TRAZODONE (DESYREL) 50 MG TABLET    Take 0.5-1 tablets (25-50 mg total) by mouth at bedtime as needed for sleep.   Modified Medications   Modified Medication Previous Medication   AMBULATORY NON FORMULARY MEDICATION AMBULATORY NON FORMULARY MEDICATION      Medication Name: Compounded Estrogen 0.02 (Estradiol)  [employer]    Medication Name: Compounded Estrogen 0.02 (Estradiol)  [employer],  Frederico Hamman T. Moses Odoherty, MD   Patient's Medications  New Prescriptions   TRAZODONE (DESYREL) 50 MG TABLET    Take 0.5-1 tablets (25-50 mg total) by mouth at bedtime as needed for sleep.  Previous Medications   ALBUTEROL (PROVENTIL HFA;VENTOLIN HFA) 108 (90 BASE) MCG/ACT INHALER    Inhale 2 puffs into the lungs every 6 (six) hours as needed for wheezing or shortness of breath.   ASPIRIN 81 MG TABLET    Take 81 mg by mouth daily.   CHOLECALCIFEROL (VITAMIN D) 1000 UNITS TABLET    Take 1,000 Units by mouth daily.   ESOMEPRAZOLE (NEXIUM) 20 MG CAPSULE    Take 20 mg by mouth daily at 12 noon.   FINASTERIDE (PROPECIA) 1 MG TABLET    TAKE ONE TABLET BY MOUTH EVERY DAY   IBUPROFEN (ADVIL,MOTRIN) 200 MG TABLET    Take 200 mg by mouth every 6 (six) hours as needed.   LISINOPRIL-HYDROCHLOROTHIAZIDE (PRINZIDE,ZESTORETIC) 20-25 MG TABLET    Take 1 tablet by mouth daily.   MULTIPLE VITAMIN (MULTIVITAMIN) TABLET    Take 1 tablet by mouth daily.    Modified Medications   Modified Medication Previous Medication   AMBULATORY NON FORMULARY MEDICATION  AMBULATORY NON FORMULARY MEDICATION      Medication Name: Compounded Estrogen 0.02 (Estradiol)  [employer]    Medication Name: Compounded Estrogen 0.02 (Estradiol)  [employer]  Discontinued Medications   No medications on file

## 2016-06-18 ENCOUNTER — Ambulatory Visit: Payer: PPO

## 2016-06-24 ENCOUNTER — Ambulatory Visit
Admission: RE | Admit: 2016-06-24 | Discharge: 2016-06-24 | Disposition: A | Discharge status: Home / Self Care | Payer: PPO | Source: Ambulatory Visit | Attending: Family Medicine | Admitting: Family Medicine

## 2016-06-24 DIAGNOSIS — E2839 Other primary ovarian failure: Secondary | ICD-10-CM | POA: Insufficient documentation

## 2016-06-24 DIAGNOSIS — M8589 Other specified disorders of bone density and structure, multiple sites: Secondary | ICD-10-CM | POA: Diagnosis not present

## 2016-06-24 DIAGNOSIS — M85852 Other specified disorders of bone density and structure, left thigh: Secondary | ICD-10-CM | POA: Diagnosis not present

## 2016-07-15 ENCOUNTER — Encounter: Payer: Self-pay | Admitting: Family

## 2016-07-15 ENCOUNTER — Ambulatory Visit (INDEPENDENT_AMBULATORY_CARE_PROVIDER_SITE_OTHER): Payer: PPO | Admitting: Family

## 2016-07-15 VITALS — BP 146/84 | HR 96 | Temp 98.7°F | Ht 60.0 in | Wt 169.4 lb

## 2016-07-15 DIAGNOSIS — J209 Acute bronchitis, unspecified: Secondary | ICD-10-CM | POA: Diagnosis not present

## 2016-07-15 MED ORDER — PROMETHAZINE-DM 6.25-15 MG/5ML PO SYRP: 5.0000 mL | ORAL_SOLUTION | Freq: Every evening | ORAL | 1 refills | Status: DC | PRN

## 2016-07-15 MED ORDER — ALBUTEROL SULFATE HFA 108 (90 BASE) MCG/ACT IN AERS: 2.0000 | INHALATION_SPRAY | Freq: Four times a day (QID) | RESPIRATORY_TRACT | 0 refills | Status: DC | PRN

## 2016-07-15 MED ORDER — ALBUTEROL SULFATE (2.5 MG/3ML) 0.083% IN NEBU: 2.5000 mg | INHALATION_SOLUTION | Freq: Once | RESPIRATORY_TRACT | Status: AC

## 2016-07-15 NOTE — Progress Notes (Signed)
Subjective:    Patient ID: Dana Hayes, female    DOB: 07/31/1946, 70 y.o.   MRN: [id number]  CC: [name] is a 70 y.o. female who presents today for an acute visit.    HPI: CC: thick productive cough 5 days, worsening. Endorses sore throat, congestion, sinus pressure, wheeze, SOB. No fever, chills.  Tried mucinex  DM with no relief however BP elevated.   H/o asthma, on inhaler nonsmoker    HISTORY:  Past Medical History:  Diagnosis Date  . Allergy   . Asthma   . GERD (gastroesophageal reflux disease)   . Hyperlipidemia   . Hypertension   . Obesity    Past Surgical History:  Procedure Laterality Date  . ABDOMINAL HYSTERECTOMY    . LITHOTRIPSY      TIMES 2 1990   Family History  Problem Relation Age of Onset  . Coronary artery disease Mother   . Diabetes Mother   . Coronary artery disease Brother   . Heart attack Brother   . Breast cancer Maternal Aunt     early 64s    Allergies: Codeine and Estradiol Current Outpatient Prescriptions on File Prior to Visit  Medication Sig Dispense Refill  . AMBULATORY NON FORMULARY MEDICATION Medication Name: Compounded Estrogen 0.02 (Estradiol)  [employer] (Patient taking differently: Medication Name: Compounded Estrogen 0.02 (Estradiol)  [employer]) 1 Package 11  . aspirin 81 MG tablet Take 81 mg by mouth daily.    . cholecalciferol (VITAMIN D) 1000 UNITS tablet Take 1,000 Units by mouth daily.    Marland Kitchen esomeprazole (NEXIUM) 20 MG capsule Take 20 mg by mouth daily at 12 noon.    . finasteride (PROPECIA) 1 MG tablet TAKE ONE TABLET BY MOUTH EVERY DAY 90 tablet 3  . ibuprofen (ADVIL,MOTRIN) 200 MG tablet Take 200 mg by mouth every 6 (six) hours as needed.    Marland Kitchen lisinopril-hydrochlorothiazide (PRINZIDE,ZESTORETIC) 20-25 MG tablet Take 1 tablet by mouth daily. 30 tablet 11  . Multiple Vitamin (MULTIVITAMIN) tablet Take 1 tablet by mouth daily.      . traZODone (DESYREL) 50 MG tablet Take 0.5-1 tablets (25-50 mg  total) by mouth at bedtime as needed for sleep. 30 tablet 5   No current facility-administered medications on file prior to visit.     Social History  Substance Use Topics  . Smoking status: Never Smoker  . Smokeless tobacco: Never Used  . Alcohol use Yes     Comment: occassionally a glass of wine or beer    Review of Systems  Constitutional: Negative for chills and fever.  HENT: Positive for congestion, rhinorrhea and sore throat.   Respiratory: Positive for cough, shortness of breath and wheezing.   Cardiovascular: Negative for chest pain and palpitations.  Gastrointestinal: Negative for nausea and vomiting.      Objective:    BP (!) 146/84   Pulse 96   Temp 98.7 F (37.1 C) (Oral)   Ht 5' (1.524 m)   Wt 169 lb 6.4 oz (76.8 kg)   SpO2 97%   BMI 33.08 kg/m    Physical Exam  Constitutional: She appears well-developed and well-nourished.  HENT:  Head: Normocephalic and atraumatic.  Right Ear: Hearing, tympanic membrane, external ear and ear canal normal. No drainage, swelling or tenderness. No foreign bodies. Tympanic membrane is not erythematous and not bulging. No middle ear effusion. No decreased hearing is noted.  Left Ear: Hearing, tympanic membrane, external ear and ear canal normal. No  drainage, swelling or tenderness. No foreign bodies. Tympanic membrane is not erythematous and not bulging.  No middle ear effusion. No decreased hearing is noted.  Nose: Rhinorrhea present. Right sinus exhibits no maxillary sinus tenderness and no frontal sinus tenderness. Left sinus exhibits no maxillary sinus tenderness and no frontal sinus tenderness.  Mouth/Throat: Uvula is midline, oropharynx is clear and moist and mucous membranes are normal. No oropharyngeal exudate, posterior oropharyngeal edema, posterior oropharyngeal erythema or tonsillar abscesses.  Eyes: Conjunctivae are normal.  Cardiovascular: Regular rhythm, normal heart sounds and normal pulses.   Pulmonary/Chest:  Effort normal and breath sounds normal. She has no wheezes. She has no rhonchi. She has no rales.  Lymphadenopathy:       Head (right side): No submental, no submandibular, no tonsillar, no preauricular, no posterior auricular and no occipital adenopathy present.       Head (left side): No submental, no submandibular, no tonsillar, no preauricular, no posterior auricular and no occipital adenopathy present.    She has no cervical adenopathy.  Neurological: She is alert.  Skin: Skin is warm and dry.  Psychiatric: She has a normal mood and affect. Her speech is normal and behavior is normal. Thought content normal.  Vitals reviewed.      Assessment & Plan:   1. Acute bronchitis, unspecified organism Afebrile. [id number]. Patient and I jointly agreed to treat conservatively as suspect viral etiology. I also advised her to stop all over-the-counter decongestants as we suspect it is elevating her blood pressure today. Return precautions given.  - albuterol (PROVENTIL) (2.5 MG/3ML) 0.083% nebulizer solution 2.5 mg; Take 3 mLs (2.5 mg total) by nebulization once. - albuterol (PROVENTIL HFA;VENTOLIN HFA) 108 (90 Base) MCG/ACT inhaler; Inhale 2 puffs into the lungs every 6 (six) hours as needed for wheezing or shortness of breath.  Dispense: 1 Inhaler; Refill: 0 - promethazine-dextromethorphan (PROMETHAZINE-DM) 6.25-15 MG/5ML syrup; Take 5 mLs by mouth at bedtime as needed for cough.  Dispense: 40 mL; Refill: 1    I am having Ms. Magistro start on promethazine-dextromethorphan. I am also having her maintain her multivitamin, ibuprofen, esomeprazole, cholecalciferol, aspirin, finasteride, lisinopril-hydrochlorothiazide, AMBULATORY NON FORMULARY MEDICATION, traZODone, and albuterol. We will continue to administer albuterol.   Meds ordered this encounter  Medications  . albuterol (PROVENTIL) (2.5 MG/3ML) 0.083% nebulizer solution 2.5 mg  . albuterol (PROVENTIL HFA;VENTOLIN HFA) 108 (90 Base) MCG/ACT  inhaler    Sig: Inhale 2 puffs into the lungs every 6 (six) hours as needed for wheezing or shortness of breath.    Dispense:  1 Inhaler    Refill:  0    Order Specific Question:   Supervising Provider    Answer:   Derrel Nip, TERESA L [[id number]]  . promethazine-dextromethorphan (PROMETHAZINE-DM) 6.25-15 MG/5ML syrup    Sig: Take 5 mLs by mouth at bedtime as needed for cough.    Dispense:  40 mL    Refill:  1    Order Specific Question:   Supervising Provider    Answer:   Crecencio Mc [[id number]]    Return precautions given.   Risks, benefits, and alternatives of the medications and treatment plan prescribed today were discussed, and patient expressed understanding.   Education regarding symptom management and diagnosis given to patient on AVS.  Continue to follow with Owens Loffler, MD for routine health maintenance.   [name] and I agreed with plan.   Mable Paris, FNP

## 2016-07-15 NOTE — Patient Instructions (Signed)
Suspect viral  PLAIN mucinex.  Use albuterol every 6 hours for first 24 hours to get good medication into the lungs and loosen congestion; after, you may use as needed and eventually stop all together when cough resolves.  Please take cough medication at night only as needed. As we discussed, I do not recommend dosing throughout the day as coughing is a protective mechanism . It also helps to break up thick mucous.   Do not take cough suppressants with alcohol as can lead to trouble breathing. Advise caution if taking cough suppressant and operating machinery ( i.e driving a car) as you may feel very tired.   Increase intake of clear fluids. Congestion is best treated by hydration, when mucus is wetter, it is thinner, less sticky, and easier to expel from the body, either through coughing up drainage, or by blowing your nose.   Get plenty of rest.   Use saline nasal drops and blow your nose frequently. Run a humidifier at night and elevate the head of the bed. Vicks Vapor rub will help with congestion and cough. Steam showers and sinus massage for congestion.   You can also try a teaspoon of honey to see if this will help reduce cough. Throat lozenges can sometimes be beneficial as well.    This illness will typically last 7 - 10 days.   Please follow up with our clinic if you develop a fever greater than 101 F, symptoms worsen, or do not resolve in the next week.

## 2016-07-15 NOTE — Progress Notes (Signed)
Pre visit review using our clinic review tool, if applicable. No additional management support is needed unless otherwise documented below in the visit note. 

## 2016-12-25 DIAGNOSIS — H2512 Age-related nuclear cataract, left eye: Secondary | ICD-10-CM | POA: Diagnosis not present

## 2017-03-23 ENCOUNTER — Encounter: Payer: Self-pay | Admitting: Family Medicine

## 2017-03-23 ENCOUNTER — Ambulatory Visit (INDEPENDENT_AMBULATORY_CARE_PROVIDER_SITE_OTHER): Payer: PPO | Admitting: Family Medicine

## 2017-03-23 DIAGNOSIS — L255 Unspecified contact dermatitis due to plants, except food: Secondary | ICD-10-CM | POA: Insufficient documentation

## 2017-03-23 DIAGNOSIS — T148XXA Other injury of unspecified body region, initial encounter: Secondary | ICD-10-CM | POA: Insufficient documentation

## 2017-03-23 NOTE — Assessment & Plan Note (Signed)
New problem. Appears to be resolving. Supportive care.

## 2017-03-23 NOTE — Assessment & Plan Note (Signed)
New problem.  From blister from poison ivy. No evidence of infection. Advised topical antibiotic ointment and to keep the wound covered. I dressed her wound today with Tegaderm.

## 2017-03-23 NOTE — Progress Notes (Signed)
Subjective:  Patient ID: EZRIE BUNYAN, female    DOB: Dec 05, 1945  Age: 71 y.o. MRN: [id number]  CC: Poison ivy; open wound  HPI:  71 year old female presents with the above complaints.  Patient reports that she got into some poison ivy last weekend. She subsequently developed a rash on her left lower leg. The area blistered up and the blister subsequently ruptured. She now has an open wound on her left lower leg. She states it is not healing. Some redness per her report. No associated fevers or chills. She states that the area itches. She been using Caladryl and Neosporin. She's also been applying peroxide. Mild in severity. No other associated symptoms. No other complaints or concerns this time.  Social Hx   Social History   Social History  . Marital status: Married    Spouse name: N/A  . Number of children: N/A  . Years of education: N/A   Occupational History  . buyer [employer]   Social History Main Topics  . Smoking status: Never Smoker  . Smokeless tobacco: Never Used  . Alcohol use Yes     Comment: occassionally a glass of wine or beer  . Drug use: No  . Sexual activity: Yes   Other Topics Concern  . None   Social History Narrative   REGULAR EXERCISE: YES- WALKS 3-4-DAYS A WEEK      Diet: no fast food, instead veggies, loves baking    Review of Systems  Constitutional: Negative.   Skin: Positive for rash and wound.   Objective:  BP (!) 158/98 (BP Location: Right Arm, Patient Position: Sitting, Cuff Size: Normal)   Pulse 84   Temp 98.2 F (36.8 C) (Oral)   Resp 16   Wt 158 lb 8 oz (71.9 kg)   SpO2 97%   BMI 30.95 kg/m   BP/Weight 03/23/2017 07/15/2016 54/00/8676  Systolic BP 195 093 267  Diastolic BP 98 84 78  Wt. (Lbs) 158.5 169.4 166.5  BMI 30.95 33.08 32.46    Physical Exam  Constitutional: She is oriented to person, place, and time. She appears well-developed. No distress.  HENT:  Head: Normocephalic and atraumatic.  Pulmonary/Chest:  Effort normal. No respiratory distress.  Neurological: She is alert and oriented to person, place, and time.  Skin:  1 cm x 1 cm superficial wound noted. Minimal surrounding erythema. No drainage.  Psychiatric: She has a normal mood and affect.  Vitals reviewed.   Lab Results  Component Value Date   WBC 7.3 05/09/2016   HGB 15.1 (H) 05/09/2016   HCT 44.5 05/09/2016   PLT 337.0 05/09/2016   GLUCOSE 118 (H) 05/09/2016   CHOL 250 (H) 05/09/2016   TRIG 123.0 05/09/2016   HDL 53.50 05/09/2016   LDLDIRECT 162.6 05/29/2010   LDLCALC 172 (H) 05/09/2016   ALT 23 05/09/2016   AST 18 05/09/2016   NA 139 05/09/2016   K 3.9 05/09/2016   CL 104 05/09/2016   CREATININE 0.71 05/09/2016   BUN 16 05/09/2016   CO2 26 05/09/2016   TSH 0.45 05/09/2016    Assessment & Plan:   Problem List Items Addressed This Visit    Dermatitis due to plants, including poison ivy, sumac, and oak    New problem. Appears to be resolving. Supportive care.      Open wound    New problem.  From blister from poison ivy. No evidence of infection. Advised topical antibiotic ointment and to keep the wound covered. I dressed her  wound today with Tegaderm.        Follow-up: PRN  [place]

## 2017-04-01 DIAGNOSIS — L565 Disseminated superficial actinic porokeratosis (DSAP): Secondary | ICD-10-CM | POA: Diagnosis not present

## 2017-04-01 DIAGNOSIS — X32XXXA Exposure to sunlight, initial encounter: Secondary | ICD-10-CM | POA: Diagnosis not present

## 2017-04-01 DIAGNOSIS — L814 Other melanin hyperpigmentation: Secondary | ICD-10-CM | POA: Diagnosis not present

## 2017-04-16 ENCOUNTER — Other Ambulatory Visit: Payer: Self-pay | Admitting: Family Medicine

## 2017-04-20 ENCOUNTER — Telehealth: Payer: Self-pay | Admitting: Family Medicine

## 2017-04-20 NOTE — Telephone Encounter (Signed)
Left pt message asking to call Ebony Hail back directly at [phone] to schedule AWV + labs with Katha Cabal and CPE with PCP.  *NOTE* Last AWV 05/09/16; please schedule 05/10/17 or after

## 2017-04-22 ENCOUNTER — Other Ambulatory Visit: Payer: Self-pay | Admitting: Family Medicine

## 2017-04-22 DIAGNOSIS — Z1231 Encounter for screening mammogram for malignant neoplasm of breast: Secondary | ICD-10-CM

## 2017-04-29 ENCOUNTER — Ambulatory Visit
Admission: RE | Admit: 2017-04-29 | Discharge: 2017-04-29 | Disposition: A | Discharge status: Home / Self Care | Payer: PPO | Source: Ambulatory Visit | Attending: Family Medicine | Admitting: Family Medicine

## 2017-04-29 DIAGNOSIS — Z1231 Encounter for screening mammogram for malignant neoplasm of breast: Secondary | ICD-10-CM | POA: Diagnosis not present

## 2017-05-14 ENCOUNTER — Ambulatory Visit: Payer: PPO

## 2017-05-18 ENCOUNTER — Ambulatory Visit (INDEPENDENT_AMBULATORY_CARE_PROVIDER_SITE_OTHER): Payer: PPO

## 2017-05-18 VITALS — BP 136/88 | HR 84 | Temp 98.5°F | Ht 60.75 in | Wt 154.5 lb

## 2017-05-18 DIAGNOSIS — Z0000 Encounter for general adult medical examination without abnormal findings: Secondary | ICD-10-CM | POA: Diagnosis not present

## 2017-05-18 DIAGNOSIS — E785 Hyperlipidemia, unspecified: Secondary | ICD-10-CM

## 2017-05-18 DIAGNOSIS — E559 Vitamin D deficiency, unspecified: Secondary | ICD-10-CM | POA: Diagnosis not present

## 2017-05-18 DIAGNOSIS — I10 Essential (primary) hypertension: Secondary | ICD-10-CM

## 2017-05-18 DIAGNOSIS — Z23 Encounter for immunization: Secondary | ICD-10-CM | POA: Diagnosis not present

## 2017-05-18 LAB — CBC WITH DIFFERENTIAL/PLATELET
BASOS ABS: 0.1 10*3/uL (ref 0.0–0.1)
Basophils Relative: 0.9 % (ref 0.0–3.0)
EOS ABS: 0.1 10*3/uL (ref 0.0–0.7)
Eosinophils Relative: 1.4 % (ref 0.0–5.0)
HEMATOCRIT: 45.4 % (ref 36.0–46.0)
Hemoglobin: 14.9 g/dL (ref 12.0–15.0)
LYMPHS PCT: 41.4 % (ref 12.0–46.0)
Lymphs Abs: 3.8 10*3/uL (ref 0.7–4.0)
MCHC: 32.9 g/dL (ref 30.0–36.0)
MCV: 84.5 fl (ref 78.0–100.0)
Monocytes Absolute: 0.8 10*3/uL (ref 0.1–1.0)
Monocytes Relative: 9.1 % (ref 3.0–12.0)
NEUTROS ABS: 4.3 10*3/uL (ref 1.4–7.7)
Neutrophils Relative %: 47.2 % (ref 43.0–77.0)
PLATELETS: 353 10*3/uL (ref 150.0–400.0)
RBC: 5.37 Mil/uL — AB (ref 3.87–5.11)
RDW: 13.4 % (ref 11.5–15.5)
WBC: 9.2 10*3/uL (ref 4.0–10.5)

## 2017-05-18 LAB — HEPATIC FUNCTION PANEL
ALK PHOS: 53 U/L (ref 39–117)
ALT: 20 U/L (ref 0–35)
AST: 16 U/L (ref 0–37)
Albumin: 4.6 g/dL (ref 3.5–5.2)
BILIRUBIN DIRECT: 0.1 mg/dL (ref 0.0–0.3)
TOTAL PROTEIN: 7 g/dL (ref 6.0–8.3)
Total Bilirubin: 0.5 mg/dL (ref 0.2–1.2)

## 2017-05-18 LAB — LIPID PANEL
CHOL/HDL RATIO: 4
Cholesterol: 255 mg/dL — ABNORMAL HIGH (ref 0–200)
HDL: 57.2 mg/dL (ref >39.00)
LDL CALC: 169 mg/dL — AB (ref 0–99)
NONHDL: 197.71
TRIGLYCERIDES: 146 mg/dL (ref 0.0–149.0)
VLDL: 29.2 mg/dL (ref 0.0–40.0)

## 2017-05-18 LAB — TSH: TSH: 0.5 u[IU]/mL (ref 0.35–4.50)

## 2017-05-18 LAB — BASIC METABOLIC PANEL
BUN: 13 mg/dL (ref 6–23)
CHLORIDE: 99 meq/L (ref 96–112)
CO2: 28 meq/L (ref 19–32)
CREATININE: 0.66 mg/dL (ref 0.40–1.20)
Calcium: 9.7 mg/dL (ref 8.4–10.5)
GFR: 94.26 mL/min (ref >60.00)
Glucose, Bld: 96 mg/dL (ref 70–99)
Potassium: 4.2 mEq/L (ref 3.5–5.1)
Sodium: 137 mEq/L (ref 135–145)

## 2017-05-18 LAB — VITAMIN D 25 HYDROXY (VIT D DEFICIENCY, FRACTURES): VITD: 31.67 ng/mL (ref 30.00–100.00)

## 2017-05-18 NOTE — Progress Notes (Signed)
PCP notes:   Health maintenance:  Flu vaccine - administered  Abnormal screenings:   Hearing - failed  Hearing Screening   125Hz  250Hz  500Hz  1000Hz  2000Hz  3000Hz  4000Hz  6000Hz  8000Hz   Right ear:   40 40 40  0    Left ear:   40 40 40  40     Patient concerns:   Lisinopril-HCTZ is causing an intermittent cough. Patient stopped medication temporarily and cough stopped. Patient stated she has resumed taking medication until she has next appt with PCP.  Nurse concerns:  None  Next PCP appt:   05/20/17 @ 1030

## 2017-05-18 NOTE — Telephone Encounter (Signed)
AWV has been completed. Closing encounter.

## 2017-05-18 NOTE — Progress Notes (Signed)
Pre visit review using our clinic review tool, if applicable. No additional management support is needed unless otherwise documented below in the visit note. 

## 2017-05-18 NOTE — Patient Instructions (Signed)
Ms. Glad , Thank you for taking time to come for your Medicare Wellness Visit. I appreciate your ongoing commitment to your health goals. Please review the following plan we discussed and let me know if I can assist you in the future.   These are the goals we discussed: Goals    . Increase physical activity          Starting 05/18/2017, I will continue to exercise at least 60 min 3 days per week.        This is a list of the screening recommended for you and due dates:  Health Maintenance  Topic Date Due  . Colon Cancer Screening  12/27/2017  . Mammogram  04/30/2019  . Tetanus Vaccine  08/23/2022  . Flu Shot  Completed  . DEXA scan (bone density measurement)  Completed  .  Hepatitis C: One time screening is recommended by Center for Disease Control  (CDC) for  adults born from [birth year] through 1963.   Completed  . Pneumonia vaccines  Completed   Preventive Care for Adults  A healthy lifestyle and preventive care can promote health and wellness. Preventive health guidelines for adults include the following key practices.  . A routine yearly physical is a good way to check with your health care provider about your health and preventive screening. It is a chance to share any concerns and updates on your health and to receive a thorough exam.  . Visit your dentist for a routine exam and preventive care every 6 months. Brush your teeth twice a day and floss once a day. Good oral hygiene prevents tooth decay and gum disease.  . The frequency of eye exams is based on your age, health, family medical history, use  of contact lenses, and other factors. Follow your health care provider's ecommendations for frequency of eye exams.  . Eat a healthy diet. Foods like vegetables, fruits, whole grains, low-fat dairy products, and lean protein foods contain the nutrients you need without too many calories. Decrease your intake of foods high in solid fats, added sugars, and salt. Eat the right amount  of calories for you. Get information about a proper diet from your health care provider, if necessary.  . Regular physical exercise is one of the most important things you can do for your health. Most adults should get at least 150 minutes of moderate-intensity exercise (any activity that increases your heart rate and causes you to sweat) each week. In addition, most adults need muscle-strengthening exercises on 2 or more days a week.  Silver Sneakers may be a benefit available to you. To determine eligibility, you may visit the website: www.silversneakers.com or contact program at (619)510-6406 Mon-Fri between 8AM-8PM.   . Maintain a healthy weight. The body mass index (BMI) is a screening tool to identify possible weight problems. It provides an estimate of body fat based on height and weight. Your health care provider can find your BMI and can help you achieve or maintain a healthy weight.   For adults 20 years and older: ? A BMI below 18.5 is considered underweight. ? A BMI of 18.5 to 24.9 is normal. ? A BMI of 25 to 29.9 is considered overweight. ? A BMI of 30 and above is considered obese.   . Maintain normal blood lipids and cholesterol levels by exercising and minimizing your intake of saturated fat. Eat a balanced diet with plenty of fruit and vegetables. Blood tests for lipids and cholesterol should begin at age 72  and be repeated every 5 years. If your lipid or cholesterol levels are high, you are over 50, or you are at high risk for heart disease, you may need your cholesterol levels checked more frequently. Ongoing high lipid and cholesterol levels should be treated with medicines if diet and exercise are not working.  . If you smoke, find out from your health care provider how to quit. If you do not use tobacco, please do not start.  . If you choose to drink alcohol, please do not consume more than 2 drinks per day. One drink is considered to be 12 ounces (355 mL) of beer, 5 ounces  (148 mL) of wine, or 1.5 ounces (44 mL) of liquor.  . If you are 49-71 years old, ask your health care provider if you should take aspirin to prevent strokes.  . Use sunscreen. Apply sunscreen liberally and repeatedly throughout the day. You should seek shade when your shadow is shorter than you. Protect yourself by wearing long sleeves, pants, a wide-brimmed hat, and sunglasses year round, whenever you are outdoors.  . Once a month, do a whole body skin exam, using a mirror to look at the skin on your back. Tell your health care provider of new moles, moles that have irregular borders, moles that are larger than a pencil eraser, or moles that have changed in shape or color.

## 2017-05-18 NOTE — Progress Notes (Signed)
I reviewed health advisor's note, was available for consultation, and agree with documentation and plan.   Signed,  Riya Huxford T. Leaha Cuervo, MD  

## 2017-05-18 NOTE — Progress Notes (Signed)
Subjective:   Dana Hayes is a 71 y.o. female who presents for Medicare Annual (Subsequent) preventive examination.  Review of Systems:  N/A Cardiac Risk Factors include: advanced age (>38men, >74 women);obesity (BMI >30kg/m2);dyslipidemia;hypertension     Objective:     Vitals: BP 136/88 (BP Location: Right Arm, Patient Position: Sitting, Cuff Size: Normal)   Pulse 84   Temp 98.5 F (36.9 C) (Oral)   Ht 5' 0.75" (1.543 m) Comment: no shoes  Wt 154 lb 8 oz (70.1 kg)   SpO2 94%   BMI 29.43 kg/m   Body mass index is 29.43 kg/m.   Tobacco History  Smoking Status  . Never Smoker  Smokeless Tobacco  . Never Used     Counseling given: No   Past Medical History:  Diagnosis Date  . Allergy   . Asthma   . GERD (gastroesophageal reflux disease)   . Hyperlipidemia   . Hypertension   . Obesity    Past Surgical History:  Procedure Laterality Date  . ABDOMINAL HYSTERECTOMY    . LITHOTRIPSY      TIMES 2 1990   Family History  Problem Relation Age of Onset  . Coronary artery disease Mother   . Diabetes Mother   . Coronary artery disease Brother   . Heart attack Brother   . Breast cancer Maternal Aunt        early 18s   History  Sexual Activity  . Sexual activity: Yes    Outpatient Encounter Prescriptions as of 05/18/2017  Medication Sig  . albuterol (PROVENTIL HFA;VENTOLIN HFA) 108 (90 Base) MCG/ACT inhaler Inhale 2 puffs into the lungs every 6 (six) hours as needed for wheezing or shortness of breath.  . AMBULATORY NON FORMULARY MEDICATION Medication Name: Compounded Estrogen 0.02 (Estradiol)  [employer] (Patient taking differently: Medication Name: Compounded Estrogen 0.02 (Estradiol)  [employer])  . aspirin 81 MG tablet Take 81 mg by mouth daily.  . cholecalciferol (VITAMIN D) 1000 UNITS tablet Take 1,000 Units by mouth daily.  Marland Kitchen esomeprazole (NEXIUM) 20 MG capsule Take 20 mg by mouth daily at 12 noon.  . finasteride (PROPECIA) 1 MG  tablet TAKE ONE TABLET BY MOUTH EVERY DAY  . ibuprofen (ADVIL,MOTRIN) 200 MG tablet Take 200 mg by mouth every 6 (six) hours as needed.  Marland Kitchen lisinopril-hydrochlorothiazide (PRINZIDE,ZESTORETIC) 20-25 MG tablet Take 1 tablet by mouth daily.  . Multiple Vitamin (MULTIVITAMIN) tablet Take 1 tablet by mouth daily.    . traZODone (DESYREL) 50 MG tablet Take 0.5-1 tablets (25-50 mg total) by mouth at bedtime as needed for sleep.   Facility-Administered Encounter Medications as of 05/18/2017  Medication  . albuterol (PROVENTIL) (2.5 MG/3ML) 0.083% nebulizer solution 2.5 mg    Activities of Daily Living In your present state of health, do you have any difficulty performing the following activities: 05/18/2017  Hearing? N  Vision? N  Difficulty concentrating or making decisions? N  Walking or climbing stairs? N  Dressing or bathing? N  Doing errands, shopping? N  Preparing Food and eating ? N  Using the Toilet? N  In the past six months, have you accidently leaked urine? N  Do you have problems with loss of bowel control? N  Managing your Medications? N  Managing your Finances? N  Housekeeping or managing your Housekeeping? N  Some recent data might be hidden    Patient Care Team: Owens Loffler, MD as PCP - General Oneta Rack, MD as Consulting Physician (Dermatology)  Assessment:     Hearing Screening   125Hz  250Hz  500Hz  1000Hz  2000Hz  3000Hz  4000Hz  6000Hz  8000Hz   Right ear:   40 40 40  0    Left ear:   40 40 40  40    Vision Screening Comments: Last vision exam in May 2018   Exercise Activities and Dietary recommendations Current Exercise Habits: Home exercise routine, Type of exercise: walking;strength training/weights;Other - see comments (aerobics, balance), Time (Minutes): 60, Frequency (Times/Week): 3, Weekly Exercise (Minutes/Week): 180, Intensity: Moderate, Exercise limited by: None identified  Goals    . Increase physical activity          Starting 05/18/2017,  I will continue to exercise at least 60 min 3 days per week.       Fall Risk Fall Risk  05/18/2017 07/15/2016 05/09/2016 05/09/2015 03/02/2014  Falls in the past year? No No No No No   Depression Screen PHQ 2/9 Scores 05/18/2017 07/15/2016 05/09/2016 05/09/2015  PHQ - 2 Score 0 0 0 0  PHQ- 9 Score 0 - - -     Cognitive Function MMSE - Mini Mental State Exam 05/18/2017 05/09/2016  Orientation to time 5 5  Orientation to Place 5 5  Registration 3 3  Attention/ Calculation 0 0  Recall 3 3  Language- name 2 objects 0 0  Language- repeat 1 1  Language- follow 3 step command 3 3  Language- read & follow direction 0 0  Write a sentence 0 0  Copy design 0 0  Total score 20 20     PLEASE NOTE: A Mini-Cog screen was completed. Maximum score is 20. A value of 0 denotes this part of Folstein MMSE was not completed or the patient failed this part of the Mini-Cog screening.   Mini-Cog Screening Orientation to Time - Max 5 pts Orientation to Place - Max 5 pts Registration - Max 3 pts Recall - Max 3 pts Language Repeat - Max 1 pts Language Follow 3 Step Command - Max 3 pts     Immunization History  Administered Date(s) Administered  . Influenza Split 06/09/2011  . Influenza Whole 06/04/2008, 06/12/2009, 06/05/2010  . Influenza, High Dose Seasonal PF 05/07/2015  . Influenza,inj,Quad PF,6+ Mos 07/05/2013, 05/10/2014, 05/09/2016, 05/18/2017  . Pneumococcal Conjugate-13 03/02/2014  . Pneumococcal Polysaccharide-23 05/09/2016  . Tdap 08/23/2012  . Zoster 07/11/2008   Screening Tests Health Maintenance  Topic Date Due  . COLONOSCOPY  12/27/2017  . MAMMOGRAM  04/30/2019  . TETANUS/TDAP  08/23/2022  . INFLUENZA VACCINE  Completed  . DEXA SCAN  Completed  . Hepatitis C Screening  Completed  . PNA vac Low Risk Adult  Completed      Plan:   I have personally reviewed and addressed the Medicare Annual Wellness questionnaire and have noted the following in the patient's chart:   A. Medical and social history B. Use of alcohol, tobacco or illicit drugs  C. Current medications and supplements D. Functional ability and status E.  Nutritional status F.  Physical activity G. Advance directives H. List of other physicians I.  Hospitalizations, surgeries, and ER visits in previous 12 months J.  [place] to include hearing, vision, cognitive, depression L. Referrals and appointments - none  In addition, I have reviewed and discussed with patient certain preventive protocols, quality metrics, and best practice recommendations. A written personalized care plan for preventive services as well as general preventive health recommendations were provided to patient.  See attached scanned questionnaire for additional information.  Signed,   Lindell Noe, MHA, BS, LPN Health Coach

## 2017-05-20 ENCOUNTER — Ambulatory Visit (INDEPENDENT_AMBULATORY_CARE_PROVIDER_SITE_OTHER): Payer: PPO | Admitting: Family Medicine

## 2017-05-20 ENCOUNTER — Encounter: Payer: Self-pay | Admitting: Family Medicine

## 2017-05-20 VITALS — BP 130/80 | HR 83 | Temp 98.5°F | Ht 60.75 in | Wt 156.2 lb

## 2017-05-20 DIAGNOSIS — Z0000 Encounter for general adult medical examination without abnormal findings: Secondary | ICD-10-CM | POA: Diagnosis not present

## 2017-05-20 MED ORDER — HYDROCHLOROTHIAZIDE 25 MG PO TABS: 25.0000 mg | ORAL_TABLET | Freq: Every day | ORAL | 3 refills | Status: DC

## 2017-05-20 MED ORDER — AMLODIPINE BESYLATE 5 MG PO TABS: 5.0000 mg | ORAL_TABLET | Freq: Every day | ORAL | 3 refills | Status: DC

## 2017-05-20 NOTE — Patient Instructions (Signed)
[place] [facility] medicaid for pregnancy.  Look up, search online.

## 2017-05-20 NOTE — Progress Notes (Signed)
Dr. Frederico Hamman T. Emanuele Mcwhirter, MD, [facility] [place], [address] Phone: [phone] Fax: [phone]  05/20/2017  Patient: Dana Hayes, MRN: [id number], DOB: August 09, 1945, 71 y.o.  Primary Physician:  Owens Loffler, MD   Chief Complaint  Patient presents with  . Annual Exam    Part 2   Subjective:   Dana Hayes is a 71 y.o. pleasant patient who presents with the following:  Health Maintenance Summary Reviewed and updated, unless pt declines services.  Tobacco History Reviewed. Non-smoker Alcohol: No concerns, no excessive use Exercise Habits: daily STD concerns: none Drug Use: None Birth control method: Menses regular: yes Lumps or breast concerns: no Breast Cancer Family History: maternal aunt  Had an episode on Sunday night. Ate a chuckburger at the cutting [employer]. Had some dysphagia. Then that cleared and had substernal chest ain and took 2 regular aspirin - then went up into her neck. None now.   No SOB with walking or exercise. No CP with exercise.   BP Readings from Last 3 Encounters:  05/20/17 130/80  05/18/17 136/88  03/23/17 (!) 158/98    Has developed an ACE cough - nonproductive   Health Maintenance  Topic Date Due  . COLONOSCOPY  12/27/2017  . MAMMOGRAM  04/30/2019  . TETANUS/TDAP  08/23/2022  . INFLUENZA VACCINE  Completed  . DEXA SCAN  Completed  . Hepatitis C Screening  Completed  . PNA vac Low Risk Adult  Completed    Immunization History  Administered Date(s) Administered  . Influenza Split 06/09/2011  . Influenza Whole 06/04/2008, 06/12/2009, 06/05/2010  . Influenza, High Dose Seasonal PF 05/07/2015  . Influenza,inj,Quad PF,6+ Mos 07/05/2013, 05/10/2014, 05/09/2016, 05/18/2017  . Pneumococcal Conjugate-13 03/02/2014  . Pneumococcal Polysaccharide-23 05/09/2016  . Tdap 08/23/2012  . Zoster 07/11/2008   Patient Active Problem List   Diagnosis Date Noted  . Dermatitis due  to plants, including poison ivy, sumac, and oak 03/23/2017  . DIVERTICULOSIS OF COLON 12/28/2007  . HYPERLIPIDEMIA 10/21/2007  . Essential hypertension 10/21/2007  . ALLERGIC RHINITIS 10/21/2007  . Mild persistent asthma with acute exacerbation in adult 10/21/2007  . GERD 10/21/2007  . OSTEOPENIA 10/21/2007  . NEPHROLITHIASIS, HX OF 10/21/2007   Past Medical History:  Diagnosis Date  . Allergy   . Asthma   . GERD (gastroesophageal reflux disease)   . Hyperlipidemia   . Hypertension   . Obesity    Past Surgical History:  Procedure Laterality Date  . ABDOMINAL HYSTERECTOMY    . LITHOTRIPSY      TIMES 2 1990   Social History   Social History  . Marital status: Married    Spouse name: N/A  . Number of children: N/A  . Years of education: N/A   Occupational History  . buyer [employer]   Social History Main Topics  . Smoking status: Never Smoker  . Smokeless tobacco: Never Used  . Alcohol use Yes     Comment: occassionally a glass of wine or beer  . Drug use: No  . Sexual activity: Yes   Other Topics Concern  . Not on file   Social History Narrative   REGULAR EXERCISE: YES- WALKS 3-4-DAYS A WEEK      Diet: no fast food, instead veggies, loves baking   Family History  Problem Relation Age of Onset  . Coronary artery disease Mother   . Diabetes Mother   . Coronary artery disease Brother   .  Heart attack Brother   . Breast cancer Maternal Aunt        early 30s   Allergies  Allergen Reactions  . Codeine     REACTION: Nausea and vomiting  . Ace Inhibitors Cough  . Estradiol Rash    REACTION: rash at site of administration, topical rash only    Medication list has been reviewed and updated.   General: Denies fever, chills, sweats. No significant weight loss. Eyes: Denies blurring,significant itching ENT: Denies earache, sore throat, and hoarseness.  Cardiovascular: Denies chest pains, palpitations, dyspnea on exertion,  Respiratory: Denies cough,  dyspnea at rest,wheeezing Breast: no concerns about lumps GI: as above GU: Denies dysuria, hematuria, urinary hesitancy, nocturia, denies STD risk, no concerns about discharge Musculoskeletal: Denies back pain, joint pain Derm: Denies rash, itching Neuro: Denies  paresthesias, frequent falls, frequent headaches Psych: Denies depression, anxiety Endocrine: Denies cold intolerance, heat intolerance, polydipsia Heme: Denies enlarged lymph nodes Allergy: No hayfever  Objective:   BP 130/80   Pulse 83   Temp 98.5 F (36.9 C) (Oral)   Ht 5' 0.75" (1.543 m)   Wt 156 lb 4 oz (70.9 kg)   BMI 29.77 kg/m  No exam data present  GEN: well developed, well nourished, no acute distress Eyes: conjunctiva and lids normal, PERRLA, EOMI ENT: TM clear, nares clear, oral exam WNL Neck: supple, no lymphadenopathy, no thyromegaly, no JVD Pulm: clear to auscultation and percussion, respiratory effort normal CV: regular rate and rhythm, S1-S2, no murmur, rub or gallop, no bruits Chest: no scars, masses, no lumps BREAST: breast exam declined GI: soft, non-tender; no hepatosplenomegaly, masses; active bowel sounds all quadrants GU: GU exam declined Lymph: no cervical, axillary or inguinal adenopathy MSK: gait normal, muscle tone and strength WNL, no joint swelling, effusions, discoloration, crepitus  SKIN: clear, good turgor, color WNL, no rashes, lesions, or ulcerations Neuro: normal mental status, normal strength, sensation, and motion Psych: alert; oriented to person, place and time, normally interactive and not anxious or depressed in appearance.   All labs reviewed with patient. Lipids:    Component Value Date/Time   CHOL 255 (H) 05/18/2017 1523   TRIG 146.0 05/18/2017 1523   HDL 57.20 05/18/2017 1523   LDLDIRECT 162.6 05/29/2010 0829   VLDL 29.2 05/18/2017 1523   CHOLHDL 4 05/18/2017 1523   CBC: CBC Latest Ref Rng & Units 05/18/2017 05/09/2016 05/02/2015  WBC 4.0 - 10.5 K/uL 9.2 7.3 8.2   Hemoglobin 12.0 - 15.0 g/dL 14.9 15.1(H) 15.0  Hematocrit 36.0 - 46.0 % 45.4 44.5 44.8  Platelets 150.0 - 400.0 K/uL 353.0 337.0 416.6    Basic Metabolic Panel:    Component Value Date/Time   NA 137 05/18/2017 1523   K 4.2 05/18/2017 1523   CL 99 05/18/2017 1523   CO2 28 05/18/2017 1523   BUN 13 05/18/2017 1523   CREATININE 0.66 05/18/2017 1523   GLUCOSE 96 05/18/2017 1523   CALCIUM 9.7 05/18/2017 1523   Hepatic Function Latest Ref Rng & Units 05/18/2017 05/09/2016 05/02/2015  Total Protein 6.0 - 8.3 g/dL 7.0 6.8 6.7  Albumin 3.5 - 5.2 g/dL 4.6 4.1 4.3  AST 0 - 37 U/L 16 18 17   ALT 0 - 35 U/L 20 23 24   Alk Phosphatase 39 - 117 U/L 53 55 59  Total Bilirubin 0.2 - 1.2 mg/dL 0.5 0.5 0.6  Bilirubin, Direct 0.0 - 0.3 mg/dL 0.1 0.0 0.1    Lab Results  Component Value Date   TSH 0.50 05/18/2017  Mm Digital Screening Bilateral  Result Date: 04/30/2017 CLINICAL DATA:  Screening. EXAM: DIGITAL SCREENING BILATERAL MAMMOGRAM WITH CAD COMPARISON:  Previous exam(s). ACR Breast Density Category b: There are scattered areas of fibroglandular density. FINDINGS: There are no findings suspicious for malignancy. Images were processed with CAD. IMPRESSION: No mammographic evidence of malignancy. A result letter of this screening mammogram will be mailed directly to the patient. RECOMMENDATION: Screening mammogram in one year. (Code:SM-B-01Y) BI-RADS CATEGORY  1: Negative. Electronically Signed   By: Pamelia Hoit M.D.   On: 04/30/2017 07:20    Assessment and Plan:   Healthcare maintenance    D/c ace - start norvasc  CP sounds to be esophageal spasm after eating chuckburger  Health Maintenance Exam: The patient's preventative maintenance and recommended screening tests for an annual wellness exam were reviewed in full today. Brought up to date unless services declined.  Counselled on the importance of diet, exercise, and its role in overall health and mortality. The patient's FH and SH was  reviewed, including their home life, tobacco status, and drug and alcohol status.  Follow-up in 1 year for physical exam or additional follow-up below.  Follow-up: No Follow-up on file. Or follow-up in 1 year if not noted.  No future appointments.  Meds ordered this encounter  Medications  . hydrochlorothiazide (HYDRODIURIL) 25 MG tablet    Sig: Take 1 tablet (25 mg total) by mouth daily.    Dispense:  90 tablet    Refill:  3  . amLODipine (NORVASC) 5 MG tablet    Sig: Take 1 tablet (5 mg total) by mouth daily.    Dispense:  90 tablet    Refill:  3   Medications Discontinued During This Encounter  Medication Reason  . lisinopril-hydrochlorothiazide (PRINZIDE,ZESTORETIC) 20-25 MG tablet    Signed,  Eathon Valade T. Semiyah Newgent, MD   Allergies as of 05/20/2017      Reactions   Codeine    REACTION: Nausea and vomiting   Ace Inhibitors Cough   Estradiol Rash   REACTION: rash at site of administration, topical rash only      Medication List       Accurate as of 05/20/17 11:32 AM. Always use your most recent med list.          albuterol 108 (90 Base) MCG/ACT inhaler Commonly known as:  PROVENTIL HFA;VENTOLIN HFA Inhale 2 puffs into the lungs every 6 (six) hours as needed for wheezing or shortness of breath.   AMBULATORY NON FORMULARY MEDICATION Medication Name: Compounded Estrogen 0.02 (Estradiol)  [facility]   amLODipine 5 MG tablet Commonly known as:  NORVASC Take 1 tablet (5 mg total) by mouth daily.   aspirin 81 MG tablet Take 81 mg by mouth daily.   cholecalciferol 1000 units tablet Commonly known as:  VITAMIN D Take 1,000 Units by mouth daily.   esomeprazole 20 MG capsule Commonly known as:  NEXIUM Take 20 mg by mouth daily at 12 noon.   finasteride 1 MG tablet Commonly known as:  PROPECIA TAKE ONE TABLET BY MOUTH EVERY DAY   hydrochlorothiazide 25 MG tablet Commonly known as:  HYDRODIURIL Take 1 tablet (25 mg total) by mouth daily.     ibuprofen 200 MG tablet Commonly known as:  ADVIL,MOTRIN Take 200 mg by mouth every 6 (six) hours as needed.   multivitamin tablet Take 1 tablet by mouth daily.   traZODone 50 MG tablet Commonly known as:  DESYREL Take 0.5-1 tablets (25-50 mg total) by mouth  at bedtime as needed for sleep.

## 2017-05-27 ENCOUNTER — Telehealth: Payer: Self-pay | Admitting: *Deleted

## 2017-05-27 ENCOUNTER — Ambulatory Visit: Payer: Self-pay | Admitting: *Deleted

## 2017-05-27 NOTE — Telephone Encounter (Signed)
Attempted to call pt back to schedule an appt from previous triage call

## 2017-05-27 NOTE — Telephone Encounter (Signed)
Rash  X   3  Days       Raised    welps    Itches        No  New  meds

## 2017-05-27 NOTE — Telephone Encounter (Signed)
  Reason for Disposition . SEVERE itching (e.g., interferes with sleep or normal activities)  Answer Assessment - Initial Assessment Questions 1. APPEARANCE of RASH: "Describe the rash."      Red  Raised   2. LOCATION: "Where is the rash located?"      Trunk  r  Side 3. NUMBER: "How many spots are there?"      sev 4. SIZE: "How big are the spots?" (Inches, centimeters or compare to size of a coin)  1  Inch     sev  Size  Of  quarter    5. ONSET: "When did the rash start?" 3  Days  ago     6. ITCHING: "Does the rash itch?" If so, ask: "How bad is the itch?"  (Scale 1-10; or mild, moderate, severe)     itches 7. PAIN: "Does the rash hurt?" If so, ask: "How bad is the pain?"  (Scale 1-10; or mild, moderate, severe)     no 8. OTHER SYMPTOMS: "Do you have any other symptoms?" (e.g., fever)     no 9. PREGNANCY: "Is there any chance you are pregnant?" "When was your last menstrual period?"     no  Protocols used: POISON IVY - OAK - SUMAC-A-AH, RASH OR REDNESS - LOCALIZED-A-AH

## 2017-05-28 DIAGNOSIS — L508 Other urticaria: Secondary | ICD-10-CM | POA: Diagnosis not present

## 2017-06-15 ENCOUNTER — Other Ambulatory Visit: Payer: Self-pay

## 2017-06-15 ENCOUNTER — Encounter: Payer: Self-pay | Admitting: Emergency Medicine

## 2017-06-15 ENCOUNTER — Ambulatory Visit: Payer: PPO | Admitting: Family Medicine

## 2017-06-15 ENCOUNTER — Emergency Department: Payer: PPO

## 2017-06-15 ENCOUNTER — Encounter: Payer: Self-pay | Admitting: Family Medicine

## 2017-06-15 ENCOUNTER — Emergency Department
Admission: EM | Admit: 2017-06-15 | Discharge: 2017-06-15 | Disposition: A | Discharge status: Home / Self Care | Payer: PPO | Attending: Emergency Medicine | Admitting: Emergency Medicine

## 2017-06-15 VITALS — BP 140/100 | HR 98 | Temp 98.2°F | Ht 60.75 in | Wt 160.0 lb

## 2017-06-15 DIAGNOSIS — I10 Essential (primary) hypertension: Secondary | ICD-10-CM | POA: Insufficient documentation

## 2017-06-15 DIAGNOSIS — Z7982 Long term (current) use of aspirin: Secondary | ICD-10-CM | POA: Insufficient documentation

## 2017-06-15 DIAGNOSIS — R0789 Other chest pain: Secondary | ICD-10-CM | POA: Diagnosis not present

## 2017-06-15 DIAGNOSIS — Z79899 Other long term (current) drug therapy: Secondary | ICD-10-CM | POA: Diagnosis not present

## 2017-06-15 DIAGNOSIS — R1011 Right upper quadrant pain: Secondary | ICD-10-CM | POA: Diagnosis not present

## 2017-06-15 DIAGNOSIS — R079 Chest pain, unspecified: Secondary | ICD-10-CM

## 2017-06-15 DIAGNOSIS — R109 Unspecified abdominal pain: Secondary | ICD-10-CM

## 2017-06-15 DIAGNOSIS — K760 Fatty (change of) liver, not elsewhere classified: Secondary | ICD-10-CM | POA: Diagnosis not present

## 2017-06-15 DIAGNOSIS — J45909 Unspecified asthma, uncomplicated: Secondary | ICD-10-CM | POA: Diagnosis not present

## 2017-06-15 DIAGNOSIS — R0602 Shortness of breath: Secondary | ICD-10-CM | POA: Diagnosis not present

## 2017-06-15 LAB — BASIC METABOLIC PANEL
Anion gap: 14 (ref 5–15)
BUN: 20 mg/dL (ref 6–20)
CO2: 23 mmol/L (ref 22–32)
Calcium: 9.5 mg/dL (ref 8.9–10.3)
Chloride: 100 mmol/L — ABNORMAL LOW (ref 101–111)
Creatinine, Ser: 0.7 mg/dL (ref 0.44–1.00)
GFR calc Af Amer: >60 mL/min (ref >60)
GFR calc non Af Amer: >60 mL/min (ref >60)
GLUCOSE: 100 mg/dL — AB (ref 65–99)
Potassium: 3.6 mmol/L (ref 3.5–5.1)
SODIUM: 137 mmol/L (ref 135–145)

## 2017-06-15 LAB — CBC
HCT: 47.4 % — ABNORMAL HIGH (ref 35.0–47.0)
Hemoglobin: 15.9 g/dL (ref 12.0–16.0)
MCH: 27.7 pg (ref 26.0–34.0)
MCHC: 33.5 g/dL (ref 32.0–36.0)
MCV: 82.5 fL (ref 80.0–100.0)
PLATELETS: 382 10*3/uL (ref 150–440)
RBC: 5.74 MIL/uL — ABNORMAL HIGH (ref 3.80–5.20)
RDW: 13.5 % (ref 11.5–14.5)
WBC: 10.1 10*3/uL (ref 3.6–11.0)

## 2017-06-15 LAB — HEPATIC FUNCTION PANEL
ALT: 21 U/L (ref 14–54)
AST: 22 U/L (ref 15–41)
Albumin: 4.7 g/dL (ref 3.5–5.0)
Alkaline Phosphatase: 55 U/L (ref 38–126)
BILIRUBIN TOTAL: 0.5 mg/dL (ref 0.3–1.2)
Total Protein: 7.4 g/dL (ref 6.5–8.1)

## 2017-06-15 LAB — FIBRIN DERIVATIVES D-DIMER (ARMC ONLY): FIBRIN DERIVATIVES D-DIMER (ARMC): 320.86 ng{FEU}/mL (ref 0.00–499.00)

## 2017-06-15 LAB — LIPASE, BLOOD: LIPASE: 29 U/L (ref 11–51)

## 2017-06-15 LAB — TROPONIN I
Troponin I: <0.03 ng/mL (ref <0.03)
Troponin I: <0.03 ng/mL (ref <0.03)

## 2017-06-15 NOTE — ED Provider Notes (Addendum)
patient is feeling well. Troponins are negative. Ultrasound is negative. D-dimer lipase are normal. EKG has not changed over time. There are no old EKGs to compare to. As noted in Dr. Rosalita Chessman history and confirmed by me there is absolutely no relation of this chest pain to exertion. In fact patient to the gym and worked out without any difficulty. Do not believe this is cardiac. I will however have her follow up with cardiology and afterwards suggest that she follow up with  gastroenterology.discussed with Dr. Nadean [facility]ology he will notify Dr. Velva Harman.   Nena Polio, MD 06/15/17 1754    Nena Polio, MD 06/15/17 [phone]

## 2017-06-15 NOTE — ED Provider Notes (Addendum)
[facility] Emergency Department Provider Note  ____________________________________________   I have reviewed the triage vital signs and the nursing notes.   HISTORY  Chief Complaint Chest Pain    HPI Dana ALCOTT is a 71 y.o. female who presents today complaining of chest pain.  Patient has had chest pain usually associated with food off and on for the last several weeks.  Almost a month in fact.  She has had no exertional pain in fact when she gets to the gym usually her pain gets better.  Last for 20 minutes to half hour usually sharp, sometimes goes towards her back.  Does have history of reflux disease.  Was told that this is likely esophageal spasm.  Has not had endoscopy recently.  Denies any fever or chills.  She is not short of breath, she states sometimes the pain "takes her breath away".  No recent or personal or family history of PE or DVT no recent travel no leg swelling, but the pain usually begins at rest usually after food, she does have a history of reflux disease.  She is never had pain like this before a month ago but now she gets it on a regular basis and has had 6 or 7 episodes, spasmodic discomfort.  The time makes it better, food she believes makes it worse although sometimes it does not seem like food is always implicated.  There is been absolutely no exertional symptoms.  Pain when it comes as sharp, "gripping" does not radiate down the arm or to the jaw but sometimes her back hurts as well.  Has had no gallbladder disease in her past.    Past Medical History:  Diagnosis Date  . Allergy   . Asthma   . GERD (gastroesophageal reflux disease)   . Hyperlipidemia   . Hypertension   . Obesity     Patient Active Problem List   Diagnosis Date Noted  . Dermatitis due to plants, including poison ivy, sumac, and oak 03/23/2017  . DIVERTICULOSIS OF COLON 12/28/2007  . HYPERLIPIDEMIA 10/21/2007  . Essential hypertension 10/21/2007  . ALLERGIC  RHINITIS 10/21/2007  . Mild persistent asthma with acute exacerbation in adult 10/21/2007  . GERD 10/21/2007  . OSTEOPENIA 10/21/2007  . NEPHROLITHIASIS, HX OF 10/21/2007    Past Surgical History:  Procedure Laterality Date  . ABDOMINAL HYSTERECTOMY    . LITHOTRIPSY      TIMES 2 1990    Prior to Admission medications   Medication Sig Start Date End Date Taking? Authorizing Provider  albuterol (PROVENTIL HFA;VENTOLIN HFA) 108 (90 Base) MCG/ACT inhaler Inhale 2 puffs into the lungs every 6 (six) hours as needed for wheezing or shortness of breath. 07/15/16   Burnard Hawthorne, FNP  AMBULATORY NON FORMULARY MEDICATION Medication Name: Compounded Estrogen 0.02 (Estradiol)  [place] Patient taking differently: Medication Name: Compounded Estrogen 0.02 (Estradiol)  [facility] 05/14/16   Copland, Frederico Hamman, MD  amLODipine (NORVASC) 5 MG tablet Take 1 tablet (5 mg total) by mouth daily. 05/20/17   Copland, Frederico Hamman, MD  aspirin 81 MG tablet Take 81 mg by mouth daily.    [provider]  cetirizine (ZYRTEC) 10 MG tablet Take 10 mg daily by mouth.    [provider]  cholecalciferol (VITAMIN D) 1000 UNITS tablet Take 1,000 Units by mouth daily.    [provider]  esomeprazole (NEXIUM) 20 MG capsule Take 20 mg by mouth daily at 12 noon.    [provider]  finasteride (PROPECIA) 1 MG tablet TAKE ONE TABLET BY MOUTH EVERY DAY 04/16/17   Copland, Frederico Hamman, MD  hydrochlorothiazide (HYDRODIURIL) 25 MG tablet Take 1 tablet (25 mg total) by mouth daily. 05/20/17   Copland, Frederico Hamman, MD  ibuprofen (ADVIL,MOTRIN) 200 MG tablet Take 200 mg by mouth every 6 (six) hours as needed.    [provider]  Multiple Vitamin (MULTIVITAMIN) tablet Take 1 tablet by mouth daily.      [provider]  traZODone (DESYREL) 50 MG tablet Take 0.5-1 tablets (25-50 mg total) by mouth at bedtime as needed for sleep. 05/14/16   Owens Loffler, MD     Allergies Codeine; Ace inhibitors; and Estradiol  Family History  Problem Relation Age of Onset  . Coronary artery disease Mother   . Diabetes Mother   . Coronary artery disease Brother   . Heart attack Brother   . Breast cancer Maternal Aunt        early 57s    Social History Social History   Tobacco Use  . Smoking status: Never Smoker  . Smokeless tobacco: Never Used  Substance Use Topics  . Alcohol use: Yes    Comment: occassionally a glass of wine or beer  . Drug use: No    Review of Systems Constitutional: No fever/chills Eyes: No visual changes. ENT: No sore throat. No stiff neck no neck pain Cardiovascular: See HPI regarding Respiratory: Denies shortness of breath. Gastrointestinal:   no vomiting.  No diarrhea.  No constipation. Genitourinary: Negative for dysuria. Musculoskeletal: Negative lower extremity swelling Skin: Negative for rash. Neurological: Negative for severe headaches, focal weakness or numbness.   ____________________________________________   PHYSICAL EXAM:  VITAL SIGNS: ED Triage Vitals  Enc Vitals Group     BP 06/15/17 1327 (!) 170/94     Pulse Rate 06/15/17 1326 90     Resp 06/15/17 1326 18     Temp 06/15/17 1326 98.5 F (36.9 C)     Temp Source 06/15/17 1326 Oral     SpO2 06/15/17 1326 98 %     Weight 06/15/17 1327 160 lb (72.6 kg)     Height 06/15/17 1327 5\' 1"  (1.549 m)     Head Circumference --      Peak Flow --      Pain Score 06/15/17 1325 2     Pain Loc --      Pain Edu? --      Excl. in [place]? --     Constitutional: Alert and oriented. Well appearing and in no acute distress. Eyes: Conjunctivae are normal Head: Atraumatic HEENT: No congestion/rhinnorhea. Mucous membranes are moist.  Oropharynx non-erythematous Neck:   Nontender with no meningismus, no masses, no stridor Cardiovascular: Normal rate, regular rhythm. Grossly normal heart sounds.  Good peripheral circulation Chest: Tender palpation to the chest  wall around the xiphoid process when necessary patient states "ouch that is the pain right there" post back.  No crepitus no chest no shingles noted, female family member there is chaperone.  Patient also with minimal epigastric abdominal discomfort.  No guarding or rebound. Respiratory: Normal respiratory effort.  No retractions. Lungs CTAB. Abdominal: Soft and nontender. No distention. No guarding no rebound Back:  There is no focal tenderness or step off.  there is no midline tenderness there are no lesions noted. there is no CVA tenderness Musculoskeletal: No lower extremity tenderness, no upper extremity tenderness. No joint effusions, no DVT signs strong distal pulses no edema Neurologic:  Normal speech  and language. No gross focal neurologic deficits are appreciated.  Skin:  Skin is warm, dry and intact. No rash noted. Psychiatric: Mood and affect are quite anxious. Speech and behavior are normal.  ____________________________________________   LABS (all labs ordered are listed, but only abnormal results are displayed)  Labs Reviewed  BASIC METABOLIC PANEL - Abnormal; Notable for the following components:      Result Value   Chloride 100 (*)    Glucose, Bld 100 (*)    All other components within normal limits  CBC - Abnormal; Notable for the following components:   RBC 5.74 (*)    HCT 47.4 (*)    All other components within normal limits  TROPONIN I  HEPATIC FUNCTION PANEL  LIPASE, BLOOD    Pertinent labs  results that were available during my care of the patient were reviewed by me and considered in my medical decision making (see chart for details). ____________________________________________  EKG  I personally interpreted any EKGs ordered by me or sinus rhythm, rate 92 bpm, normal axis, diffuse ST changes nonspecific, lateral and anterior. ____________________________________________  RADIOLOGY  Pertinent labs & imaging results that were available during my care of  the patient were reviewed by me and considered in my medical decision making (see chart for details). If possible, patient and/or family made aware of any abnormal findings. ____________________________________________    PROCEDURES  Procedure(s) performed: None  Procedures  Critical Care performed: None  ____________________________________________   INITIAL IMPRESSION / ASSESSMENT AND PLAN / ED COURSE  Pertinent labs & imaging results that were available during my care of the patient were reviewed by me and considered in my medical decision making (see chart for details).  Patient here with very reproducible chest pain which is quite atypical for coronary artery disease.  She is somewhat anxious.  Initial cardiac enzymes are negative despite pain that began this morning, at this time she is pain-free.  EKG shows T wave changes that are not known to be new, with there is no old EKGs on record for this patient.  Is a very atypical history for CAD given its reproducibility, its relationship with food, and spastic repeated nature over the last month, and the fact that it is not exertional in fact gets better with going to the gym.  Patient does not really have risk factors for PE, and I have low suspicion that this nonpleuritic reproducible pain is a blood clot.  We will get an ultrasound of her right upper quadrant we will check serial enzymes, we will obtain liver function tests and lipase and we will reassess  Clinical Course as of Jun 17 703  Mon Jun 15, 2017  1649 Troponin I: <0.03 [PM]    Clinical Course User Index [PM] Nena Polio, MD   ____________________________________________   FINAL CLINICAL IMPRESSION(S) / ED DIAGNOSES  Final diagnoses:  Abdominal pain      This chart was dictated using voice recognition software.  Despite best efforts to proofread,  errors can occur which can change meaning.      Schuyler Amor, MD 06/15/17 1513    Schuyler Amor,  MD 06/16/17 [phone]

## 2017-06-15 NOTE — Progress Notes (Signed)
Dr. Frederico Hamman T. Adileny Delon, MD, [facility] [place], [address] Phone: [phone] Fax: [phone]  06/15/2017  Patient: Dana Hayes, MRN: [id number], DOB: 10-Jan-1946, 71 y.o.  Primary Physician:  Owens Loffler, MD   Chief Complaint  Patient presents with  . Chest Pain   Subjective:   Dana Hayes is a 71 y.o. very pleasant female patient who presents with the following:  Chest pain:  71 yo female with episodic chest pain, had some tightness this morning. None now. Had 3 of them last week, trouble breathing at the movies last week. Took some ibuprofen and it got better. ? Trouble breathing now. Some pain in neck before. Has been having episodic chest pain for at least 2-3 months. Presumed GI previously.   No prior EKG's  Risk factors: BP Chol HTN No hard drugs in the past  Past Medical History, Surgical History, Social History, Family History, Problem List, Medications, and Allergies have been reviewed and updated if relevant.  Patient Active Problem List   Diagnosis Date Noted  . Dermatitis due to plants, including poison ivy, sumac, and oak 03/23/2017  . DIVERTICULOSIS OF COLON 12/28/2007  . HYPERLIPIDEMIA 10/21/2007  . Essential hypertension 10/21/2007  . ALLERGIC RHINITIS 10/21/2007  . Mild persistent asthma with acute exacerbation in adult 10/21/2007  . GERD 10/21/2007  . OSTEOPENIA 10/21/2007  . NEPHROLITHIASIS, HX OF 10/21/2007    Past Medical History:  Diagnosis Date  . Allergy   . Asthma   . GERD (gastroesophageal reflux disease)   . Hyperlipidemia   . Hypertension   . Obesity     Past Surgical History:  Procedure Laterality Date  . ABDOMINAL HYSTERECTOMY    . LITHOTRIPSY      TIMES 2 1990    Social History   Socioeconomic History  . Marital status: Married    Spouse name: Not on file  . Number of children: Not on file  . Years of education: Not on file  . Highest education  level: Not on file  Social Needs  . Financial resource strain: Not on file  . Food insecurity - worry: Not on file  . Food insecurity - inability: Not on file  . Transportation needs - medical: Not on file  . Transportation needs - non-medical: Not on file  Occupational History  . Occupation: Health visitor: [employer]  Tobacco Use  . Smoking status: Never Smoker  . Smokeless tobacco: Never Used  Substance and Sexual Activity  . Alcohol use: Yes    Comment: occassionally a glass of wine or beer  . Drug use: No  . Sexual activity: Yes  Other Topics Concern  . Not on file  Social History Narrative   REGULAR EXERCISE: YES- WALKS 3-4-DAYS A WEEK      Diet: no fast food, instead veggies, loves baking    Family History  Problem Relation Age of Onset  . Coronary artery disease Mother   . Diabetes Mother   . Coronary artery disease Brother   . Heart attack Brother   . Breast cancer Maternal Aunt        early 28s    Allergies  Allergen Reactions  . Codeine     REACTION: Nausea and vomiting  . Ace Inhibitors Cough  . Estradiol Rash    REACTION: rash at site of administration, topical rash only    Medication list reviewed and updated in full in [facility].   GEN: No acute illnesses, no fevers, chills. GI: No n/v/d, eating normally CP as above Pulm: as above rare SOB Interactive and getting along well at home.  Otherwise, ROS is as per the HPI.  Objective:   BP (!) 140/100   Pulse 98   Temp 98.2 F (36.8 C) (Oral)   Ht 5' 0.75" (1.543 m)   Wt 160 lb (72.6 kg)   BMI 30.48 kg/m   GEN: WDWN, NAD, Non-toxic, A & O x 3 HEENT: Atraumatic, Normocephalic. Neck supple. No masses, No LAD. Ears and Nose: No external deformity. CV: RRR, No M/G/R. No JVD. No thrill. No extra heart sounds. PULM: CTA B, no wheezes, crackles, rhonchi. No retractions. No resp. distress. No accessory muscle use. EXTR: No c/c/e NEURO Normal gait.  PSYCH: Normally interactive.  Conversant. Not depressed or anxious appearing.  Calm demeanor.   Laboratory and Imaging Data:  Assessment and Plan:   Chest pain, unspecified type - Plan: EKG 12-Lead  EKG: Normal sinus rhythm. Normal axis, normal R wave progression, appears to be ST depression in V3, V4, V5.  Given prior chest pain and abnormal EKG in 3 leads to hospital.   Given 182 mg in the office.   Follow-up: No Follow-up on file.  Meds ordered this encounter  Medications  . cetirizine (ZYRTEC) 10 MG tablet    Sig: Take 10 mg daily by mouth.   There are no discontinued medications. Orders Placed This Encounter  Procedures  . EKG 12-Lead    Signed,  Christan Defranco T. Clarann Helvey, MD   Allergies as of 06/15/2017      Reactions   Codeine    REACTION: Nausea and vomiting   Ace Inhibitors Cough   Estradiol Rash   REACTION: rash at site of administration, topical rash only      Medication List        Accurate as of 06/15/17 12:40 PM. Always use your most recent med list.          albuterol 108 (90 Base) MCG/ACT inhaler Commonly known as:  PROVENTIL HFA;VENTOLIN HFA Inhale 2 puffs into the lungs every 6 (six) hours as needed for wheezing or shortness of breath.   AMBULATORY NON FORMULARY MEDICATION Medication Name: Compounded Estrogen 0.02 (Estradiol)  [facility]   amLODipine 5 MG tablet Commonly known as:  NORVASC Take 1 tablet (5 mg total) by mouth daily.   aspirin 81 MG tablet Take 81 mg by mouth daily.   cetirizine 10 MG tablet Commonly known as:  ZYRTEC Take 10 mg daily by mouth.   cholecalciferol 1000 units tablet Commonly known as:  VITAMIN D Take 1,000 Units by mouth daily.   esomeprazole 20 MG capsule Commonly known as:  NEXIUM Take 20 mg by mouth daily at 12 noon.   finasteride 1 MG tablet Commonly known as:  PROPECIA TAKE ONE TABLET BY MOUTH EVERY DAY   hydrochlorothiazide 25 MG tablet Commonly known as:  HYDRODIURIL Take 1 tablet (25 mg total) by mouth  daily.   ibuprofen 200 MG tablet Commonly known as:  ADVIL,MOTRIN Take 200 mg by mouth every 6 (six) hours as needed.   multivitamin tablet Take 1 tablet by mouth daily.   traZODone 50 MG tablet Commonly known as:  DESYREL Take 0.5-1 tablets (25-50 mg total) by mouth at bedtime as needed for sleep.

## 2017-06-15 NOTE — ED Notes (Signed)
Pt taken to US via stretcher

## 2017-06-15 NOTE — Discharge Instructions (Signed)
or your EKG is not completely normal the heart blood work and the rest of the tests are normal. Your history sounds like it is not angina. I will have him follow-up with cardiology. She did you call in the morning. Or you can call Dr. Tyrell Antonio office if they do not call you  by 1:00.

## 2017-06-15 NOTE — ED Notes (Signed)
Gave patent Ice water.

## 2017-06-15 NOTE — ED Triage Notes (Signed)
Pt c/o chest pain that started this morning. Went to PCP. Depression noted on EKG.  Repeat done here.  Chest pain has improved but still present. SHOB with pain.  Pain radiates from left chest to right chest and jaw and around to back.  Skin color WNL.

## 2017-07-02 DIAGNOSIS — R079 Chest pain, unspecified: Secondary | ICD-10-CM | POA: Insufficient documentation

## 2017-07-02 NOTE — Progress Notes (Signed)
Cardiology Office Note  Date:  07/03/2017   ID:  Jaki, Steptoe 09-19-1945, MRN [id number]  PCP:  Owens Loffler, MD   Chief Complaint  Patient presents with  . other    Referred by Dr. Edilia Bo for consultation of her chest pain. Meds reviewed by the pt. verbally. Pt. c/o chest pain that radiates to her back.     HPI:  Dana Hayes is a 71 y.o. Has a history of Hyperlipidemia Hypertension  episodic chest pain, felt secondary to GI in nature Who presents by referral from Dr. Edilia Bo for consultation of her chest pain symptoms  Seen in the emergency room June 15, 2017 for chest pain Troponin negative, d-dimer normal, EKG nonspecific changes. Hospital records reviewed with the patient in detail  She reports having clusters of episodes followed by no rest pain symptoms for weeks at a time One recent episode have tightness while watching a movie after eating popcorn Typically pain comes on at rest not with exertion Long-standing history of GERD, takes Nexium 20 mg daily "feels like esophagus" per the patient  Occasionally with chest jaw and back No SOB,   Drinks water, advil, goes away  3 episodes one week then no episodes for several weeks Able to walk the dogs, even run with the dogs without symptoms  Significant stress at home taking care of elderly mother  Ultrasound abdomen showing fatty liver no gallbladder disease  EKG personally reviewed by myself on todays visit Shows normal sinus rhythm 90 bpm nonspecific ST abnormality in anterolateral leads  PMH:   has a past medical history of Allergy, Asthma, GERD (gastroesophageal reflux disease), Hyperlipidemia, Hypertension, and Obesity.  PSH:    Past Surgical History:  Procedure Laterality Date  . ABDOMINAL HYSTERECTOMY    . LITHOTRIPSY      TIMES 2 1990    Current Outpatient Medications  Medication Sig Dispense Refill  . albuterol (PROVENTIL HFA;VENTOLIN HFA) 108 (90 Base) MCG/ACT inhaler Inhale 2 puffs  into the lungs every 6 (six) hours as needed for wheezing or shortness of breath. 1 Inhaler 0  . AMBULATORY NON FORMULARY MEDICATION Medication Name: Compounded Estrogen 0.02 (Estradiol)  [employer] (Patient taking differently: Medication Name: Compounded Estrogen 0.02 (Estradiol)  [employer]) 1 Package 11  . amLODipine (NORVASC) 5 MG tablet Take 1 tablet (5 mg total) by mouth daily. 90 tablet 3  . aspirin 81 MG tablet Take 81 mg by mouth daily.    . cetirizine (ZYRTEC) 10 MG tablet Take 10 mg daily by mouth.    . cholecalciferol (VITAMIN D) 1000 UNITS tablet Take 1,000 Units by mouth daily.    Marland Kitchen esomeprazole (NEXIUM) 20 MG capsule Take 20 mg by mouth daily at 12 noon.    . finasteride (PROPECIA) 1 MG tablet TAKE ONE TABLET BY MOUTH EVERY DAY 90 tablet 0  . hydrochlorothiazide (HYDRODIURIL) 25 MG tablet Take 1 tablet (25 mg total) by mouth daily. 90 tablet 3  . ibuprofen (ADVIL,MOTRIN) 200 MG tablet Take 200 mg by mouth every 6 (six) hours as needed.    . Multiple Vitamin (MULTIVITAMIN) tablet Take 1 tablet by mouth daily.      . traZODone (DESYREL) 50 MG tablet Take 0.5-1 tablets (25-50 mg total) by mouth at bedtime as needed for sleep. 30 tablet 5  . VITAMIN E PO Take 1 tablet daily by mouth.     Current Facility-Administered Medications  Medication Dose Route Frequency Provider Last Rate Last Dose  . albuterol (  PROVENTIL) (2.5 MG/3ML) 0.083% nebulizer solution 2.5 mg  2.5 mg Nebulization Once Burnard Hawthorne, FNP         Allergies:   Codeine; Ace inhibitors; and Estradiol   Social History:  The patient  reports that  has never smoked. she has never used smokeless tobacco. She reports that she drinks alcohol. She reports that she does not use drugs.   Family History:   family history includes Breast cancer in her maternal aunt; Coronary artery disease in her brother and mother; Diabetes in her mother; Heart attack in her brother.    Review of Systems: Review of  Systems  Constitutional: Negative.   Respiratory: Negative.   Cardiovascular: Positive for chest pain.  Gastrointestinal: Negative.   Musculoskeletal: Negative.   Neurological: Negative.   Psychiatric/Behavioral: Negative.   All other systems reviewed and are negative.    PHYSICAL EXAM: VS:  BP (!) 146/90 (BP Location: Right Arm, Patient Position: Sitting, Cuff Size: Normal)   Pulse 90   Ht 5' 1.5" (1.562 m)   Wt 160 lb 8 oz (72.8 kg)   BMI 29.84 kg/m  , BMI Body mass index is 29.84 kg/m. GEN: Well nourished, well developed, in no acute distress  HEENT: normal  Neck: no JVD, carotid bruits, or masses Cardiac: RRR; no murmurs, rubs, or gallops,no edema  Respiratory:  clear to auscultation bilaterally, normal work of breathing GI: soft, nontender, nondistended, + BS MS: no deformity or atrophy  Skin: warm and dry, no rash Neuro:  Strength and sensation are intact Psych: euthymic mood, full affect    Recent Labs: 05/18/2017: TSH 0.50 06/15/2017: ALT 21; BUN 20; Creatinine, Ser 0.70; Hemoglobin 15.9; Platelets 382; Potassium 3.6; Sodium 137    Lipid Panel Lab Results  Component Value Date   CHOL 255 (H) 05/18/2017   HDL 57.20 05/18/2017   LDLCALC 169 (H) 05/18/2017   TRIG 146.0 05/18/2017      Wt Readings from Last 3 Encounters:  07/03/17 160 lb 8 oz (72.8 kg)  06/15/17 160 lb (72.6 kg)  06/15/17 160 lb (72.6 kg)       ASSESSMENT AND PLAN:  Mixed hyperlipidemia Numbers are markedly elevated Chest x-ray with aortic atherosclerosis Recommended she complete CT coronary calcium score for risk stratification and guidance Would likely need a statin if calcium score is elevated  Chest pain, unspecified type - Plan: EKG 12-Lead, CT CARDIAC SCORING Atypical in nature, more consistent with GERD symptoms Unable to exclude hiatal hernia For risk stratification we discussed various options, suggested CT coronary calcium scoring Order placed, she will have this done  per her schedule Symptoms are nonexertional  Essential hypertension Mildly anxious today, recommend she monitor blood pressures at home.  no changes made to the medications.  Mild persistent asthma with acute exacerbation in adult Stable  Gastroesophageal reflux disease, esophagitis presence not specified Recommend she increase Nexium up to 20 mg twice daily, generic Pepcid for breakthrough symptoms Try carbonated soda for any symptoms Watch her diet such as popcorn, vegetables, chocolate,  red sauce  Disposition:   F/U as needed  Patient was seen in consultation for Dr. Edilia Bo and will be referred back to his office for ongoing care of the issues detailed above   Total encounter time more than 60 minutes  Greater than 50% was spent in counseling and coordination of care with the patient    Orders Placed This Encounter  Procedures  . CT CARDIAC SCORING  . EKG 12-Lead  Signed, Esmond Plants, M.D., Ph.D. 07/03/2017  [place], [place]

## 2017-07-03 ENCOUNTER — Encounter: Payer: Self-pay | Admitting: Cardiovascular Disease

## 2017-07-03 ENCOUNTER — Ambulatory Visit: Payer: PPO | Admitting: Cardiovascular Disease

## 2017-07-03 VITALS — BP 146/90 | HR 90 | Ht 61.5 in | Wt 160.5 lb

## 2017-07-03 DIAGNOSIS — K219 Gastro-esophageal reflux disease without esophagitis: Secondary | ICD-10-CM | POA: Diagnosis not present

## 2017-07-03 DIAGNOSIS — J4531 Mild persistent asthma with (acute) exacerbation: Secondary | ICD-10-CM

## 2017-07-03 DIAGNOSIS — R079 Chest pain, unspecified: Secondary | ICD-10-CM

## 2017-07-03 DIAGNOSIS — E782 Mixed hyperlipidemia: Secondary | ICD-10-CM | POA: Diagnosis not present

## 2017-07-03 DIAGNOSIS — I10 Essential (primary) hypertension: Secondary | ICD-10-CM

## 2017-07-03 NOTE — Patient Instructions (Addendum)
Medication Instructions:   Try to double the nexium Add pepcid/zantac Carbonated soda  Labwork:  No new labs needed  Testing/Procedures:  We will place an order for CT coronary calcium score   Follow-Up: It was a pleasure seeing you in the office today. Please call us if you have new issues that need to be addressed before your next appt.  [phone]  Your physician wants you to follow-up in:  As needed  If you need a refill on your cardiac medications before your next appointment, please call your pharmacy.

## 2017-07-16 ENCOUNTER — Ambulatory Visit (INDEPENDENT_AMBULATORY_CARE_PROVIDER_SITE_OTHER)
Admission: RE | Admit: 2017-07-16 | Discharge: 2017-07-16 | Disposition: A | Discharge status: Home / Self Care | Payer: Self-pay | Source: Ambulatory Visit | Attending: Cardiovascular Disease | Admitting: Cardiovascular Disease

## 2017-07-16 DIAGNOSIS — R079 Chest pain, unspecified: Secondary | ICD-10-CM

## 2017-07-20 ENCOUNTER — Other Ambulatory Visit: Payer: Self-pay

## 2017-07-20 DIAGNOSIS — E7800 Pure hypercholesterolemia, unspecified: Secondary | ICD-10-CM

## 2017-07-20 MED ORDER — ROSUVASTATIN CALCIUM 40 MG PO TABS: 40.0000 mg | ORAL_TABLET | Freq: Every day | ORAL | 3 refills | Status: DC

## 2017-07-20 NOTE — Progress Notes (Unsigned)
Reviewed results and recommendations w/pt who verbalized understanding.

## 2017-08-05 ENCOUNTER — Ambulatory Visit: Payer: PPO | Admitting: Internal Medicine

## 2017-08-14 ENCOUNTER — Other Ambulatory Visit: Payer: Self-pay | Admitting: Family Medicine

## 2017-09-25 DIAGNOSIS — M6240 Contracture of muscle, unspecified site: Secondary | ICD-10-CM | POA: Diagnosis not present

## 2017-09-25 DIAGNOSIS — M791 Myalgia, unspecified site: Secondary | ICD-10-CM | POA: Diagnosis not present

## 2017-09-25 DIAGNOSIS — M9903 Segmental and somatic dysfunction of lumbar region: Secondary | ICD-10-CM | POA: Diagnosis not present

## 2017-09-25 DIAGNOSIS — M9902 Segmental and somatic dysfunction of thoracic region: Secondary | ICD-10-CM | POA: Diagnosis not present

## 2017-09-25 DIAGNOSIS — M9905 Segmental and somatic dysfunction of pelvic region: Secondary | ICD-10-CM | POA: Diagnosis not present

## 2017-09-25 DIAGNOSIS — M5430 Sciatica, unspecified side: Secondary | ICD-10-CM | POA: Diagnosis not present

## 2017-09-25 DIAGNOSIS — Z72820 Sleep deprivation: Secondary | ICD-10-CM | POA: Diagnosis not present

## 2017-09-28 DIAGNOSIS — M9905 Segmental and somatic dysfunction of pelvic region: Secondary | ICD-10-CM | POA: Diagnosis not present

## 2017-09-28 DIAGNOSIS — M9902 Segmental and somatic dysfunction of thoracic region: Secondary | ICD-10-CM | POA: Diagnosis not present

## 2017-09-28 DIAGNOSIS — M9903 Segmental and somatic dysfunction of lumbar region: Secondary | ICD-10-CM | POA: Diagnosis not present

## 2017-09-28 DIAGNOSIS — M5430 Sciatica, unspecified side: Secondary | ICD-10-CM | POA: Diagnosis not present

## 2017-09-28 DIAGNOSIS — Z72820 Sleep deprivation: Secondary | ICD-10-CM | POA: Diagnosis not present

## 2017-09-28 DIAGNOSIS — M6240 Contracture of muscle, unspecified site: Secondary | ICD-10-CM | POA: Diagnosis not present

## 2017-09-28 DIAGNOSIS — M791 Myalgia, unspecified site: Secondary | ICD-10-CM | POA: Diagnosis not present

## 2017-09-30 DIAGNOSIS — M9905 Segmental and somatic dysfunction of pelvic region: Secondary | ICD-10-CM | POA: Diagnosis not present

## 2017-09-30 DIAGNOSIS — M9903 Segmental and somatic dysfunction of lumbar region: Secondary | ICD-10-CM | POA: Diagnosis not present

## 2017-09-30 DIAGNOSIS — M9902 Segmental and somatic dysfunction of thoracic region: Secondary | ICD-10-CM | POA: Diagnosis not present

## 2017-09-30 DIAGNOSIS — M5430 Sciatica, unspecified side: Secondary | ICD-10-CM | POA: Diagnosis not present

## 2017-09-30 DIAGNOSIS — M6240 Contracture of muscle, unspecified site: Secondary | ICD-10-CM | POA: Diagnosis not present

## 2017-09-30 DIAGNOSIS — M791 Myalgia, unspecified site: Secondary | ICD-10-CM | POA: Diagnosis not present

## 2017-09-30 DIAGNOSIS — Z72820 Sleep deprivation: Secondary | ICD-10-CM | POA: Diagnosis not present

## 2017-10-02 DIAGNOSIS — M6240 Contracture of muscle, unspecified site: Secondary | ICD-10-CM | POA: Diagnosis not present

## 2017-10-02 DIAGNOSIS — M5430 Sciatica, unspecified side: Secondary | ICD-10-CM | POA: Diagnosis not present

## 2017-10-02 DIAGNOSIS — M9905 Segmental and somatic dysfunction of pelvic region: Secondary | ICD-10-CM | POA: Diagnosis not present

## 2017-10-02 DIAGNOSIS — M9902 Segmental and somatic dysfunction of thoracic region: Secondary | ICD-10-CM | POA: Diagnosis not present

## 2017-10-02 DIAGNOSIS — M791 Myalgia, unspecified site: Secondary | ICD-10-CM | POA: Diagnosis not present

## 2017-10-02 DIAGNOSIS — M9903 Segmental and somatic dysfunction of lumbar region: Secondary | ICD-10-CM | POA: Diagnosis not present

## 2017-10-02 DIAGNOSIS — Z72820 Sleep deprivation: Secondary | ICD-10-CM | POA: Diagnosis not present

## 2017-10-05 DIAGNOSIS — M6240 Contracture of muscle, unspecified site: Secondary | ICD-10-CM | POA: Diagnosis not present

## 2017-10-05 DIAGNOSIS — M9902 Segmental and somatic dysfunction of thoracic region: Secondary | ICD-10-CM | POA: Diagnosis not present

## 2017-10-05 DIAGNOSIS — Z72820 Sleep deprivation: Secondary | ICD-10-CM | POA: Diagnosis not present

## 2017-10-05 DIAGNOSIS — M9905 Segmental and somatic dysfunction of pelvic region: Secondary | ICD-10-CM | POA: Diagnosis not present

## 2017-10-05 DIAGNOSIS — M5430 Sciatica, unspecified side: Secondary | ICD-10-CM | POA: Diagnosis not present

## 2017-10-05 DIAGNOSIS — M9903 Segmental and somatic dysfunction of lumbar region: Secondary | ICD-10-CM | POA: Diagnosis not present

## 2017-10-05 DIAGNOSIS — M791 Myalgia, unspecified site: Secondary | ICD-10-CM | POA: Diagnosis not present

## 2017-11-09 DIAGNOSIS — H00024 Hordeolum internum left upper eyelid: Secondary | ICD-10-CM | POA: Diagnosis not present

## 2017-11-16 DIAGNOSIS — H00024 Hordeolum internum left upper eyelid: Secondary | ICD-10-CM | POA: Diagnosis not present

## 2018-03-06 IMAGING — MG 2D DIGITAL DIAGNOSTIC UNILATERAL RIGHT MAMMOGRAM WITH CAD AND AD
6 of 9 series · 6 of 21 positions shown · non-contrast
Comparison: 04/09/2016 and earlier priors

CLINICAL DATA: Asymmetry right breast identified on recent 2D
screening mammogram.

EXAM:
2D DIGITAL DIAGNOSTIC UNILATERAL RIGHT MAMMOGRAM WITH CAD AND
ADJUNCT TOMO

[R CC (1 of 2)]
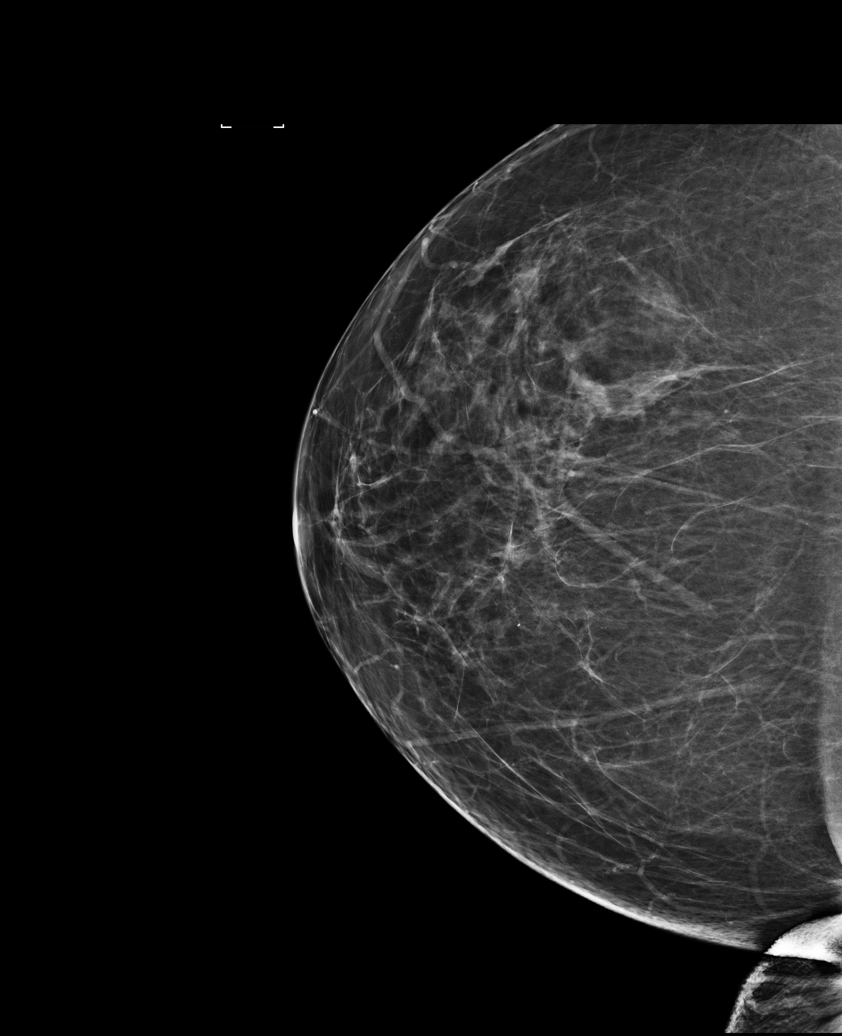

[R ML]
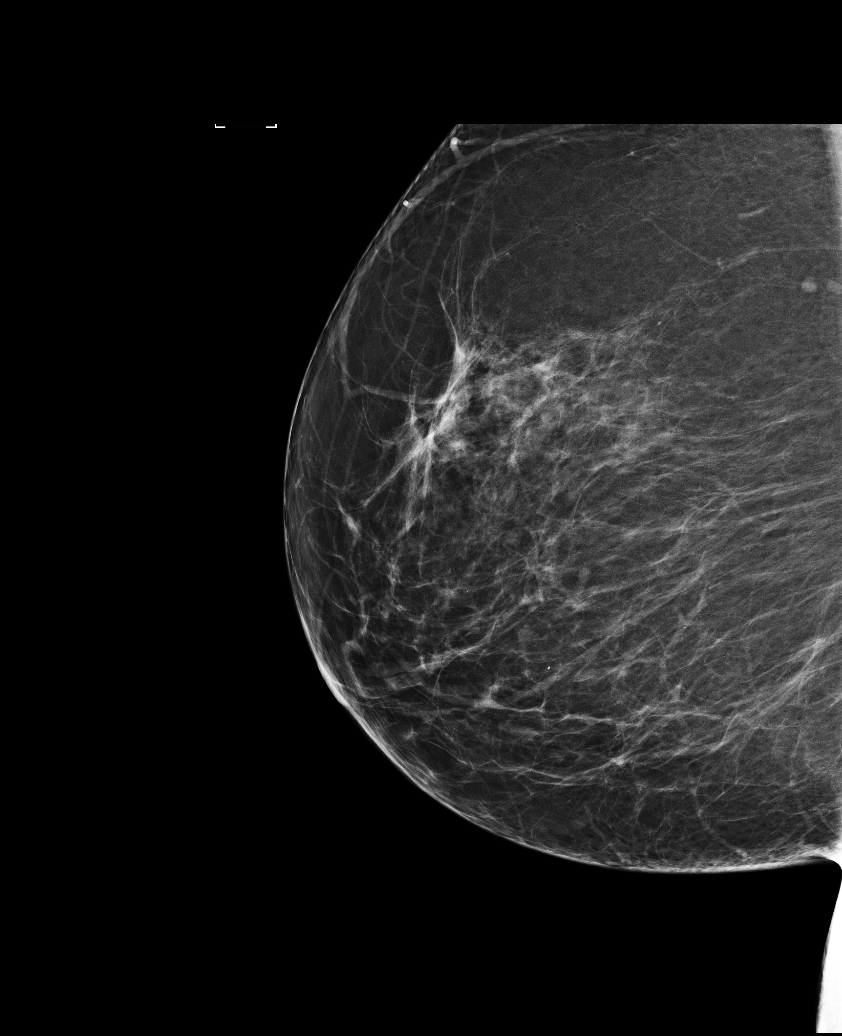

[R ML synth-2D]
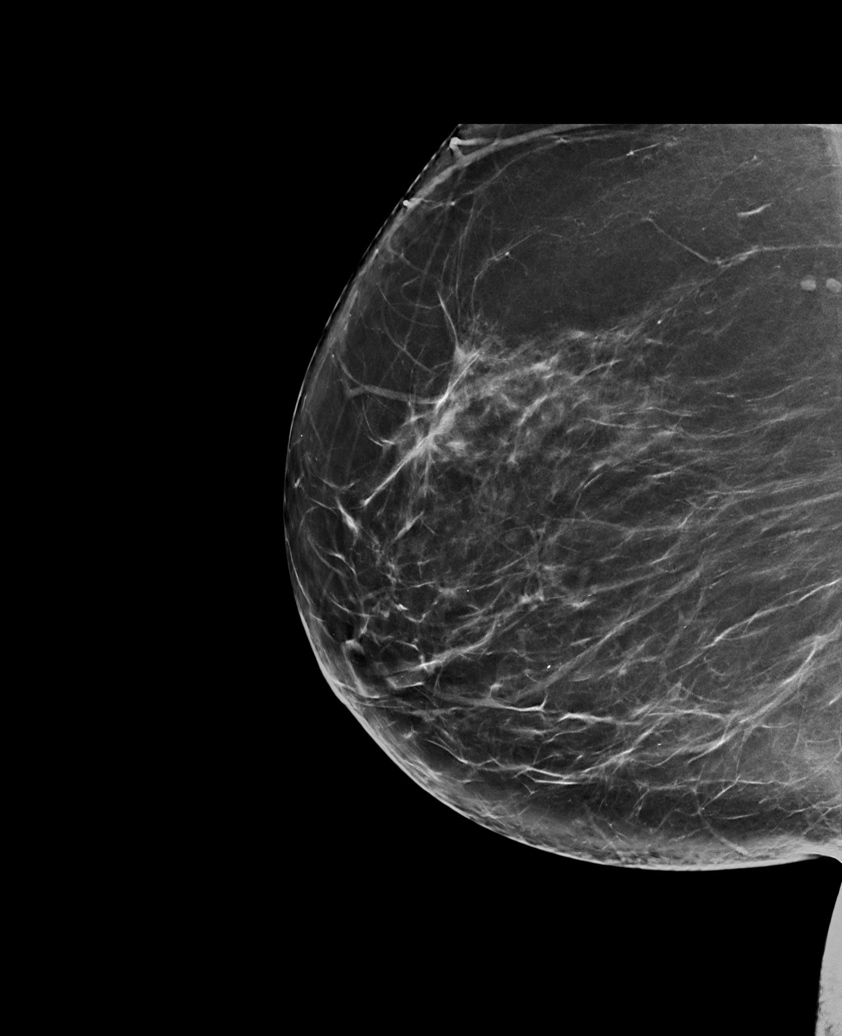

[R CC (2 of 2)]
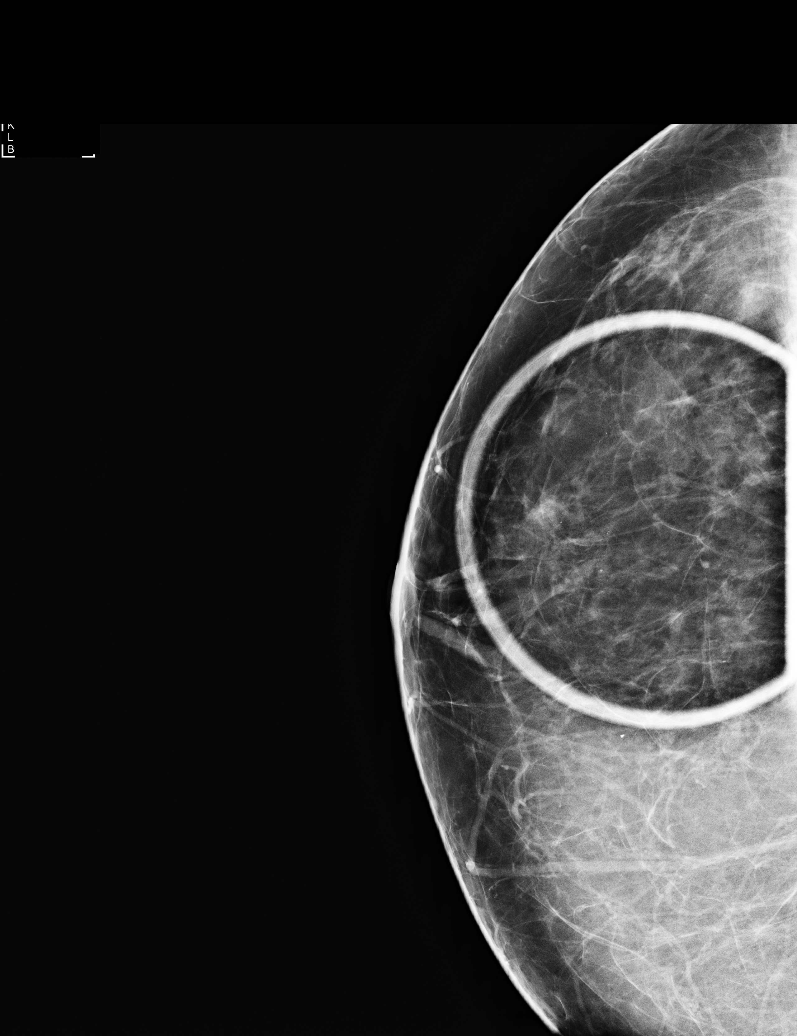

[R CC synth-2D (1 of 2)]
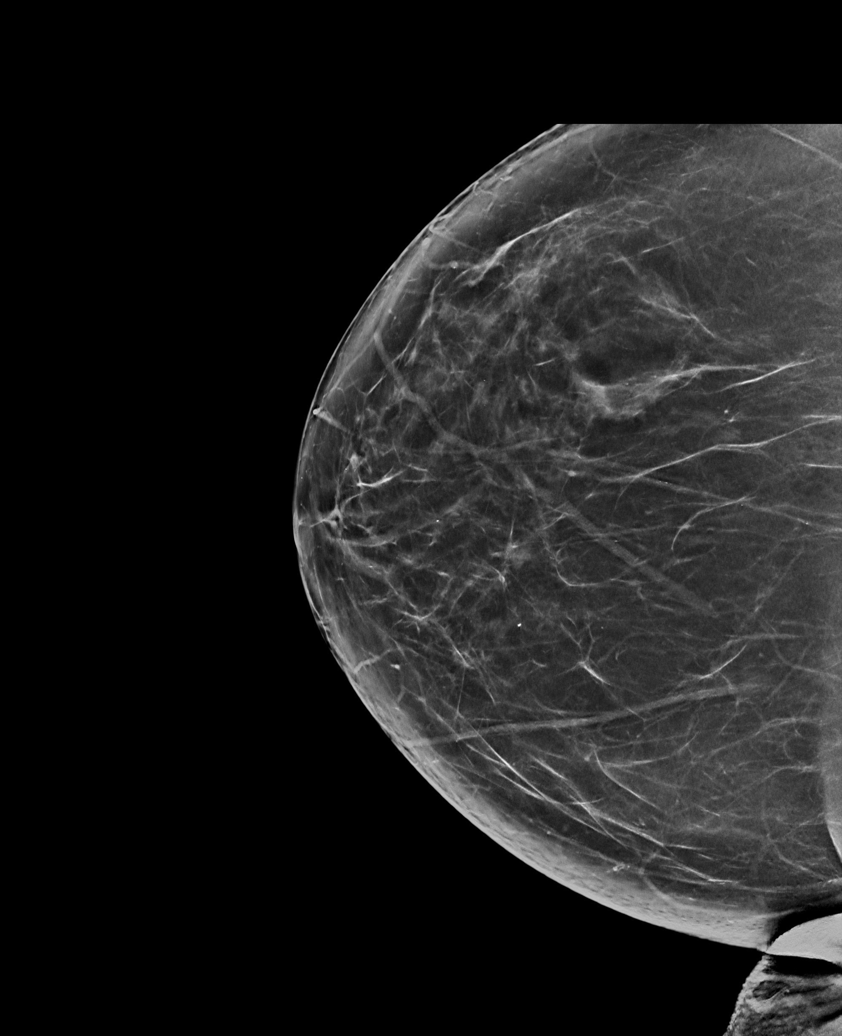

[R CC synth-2D (2 of 2)]
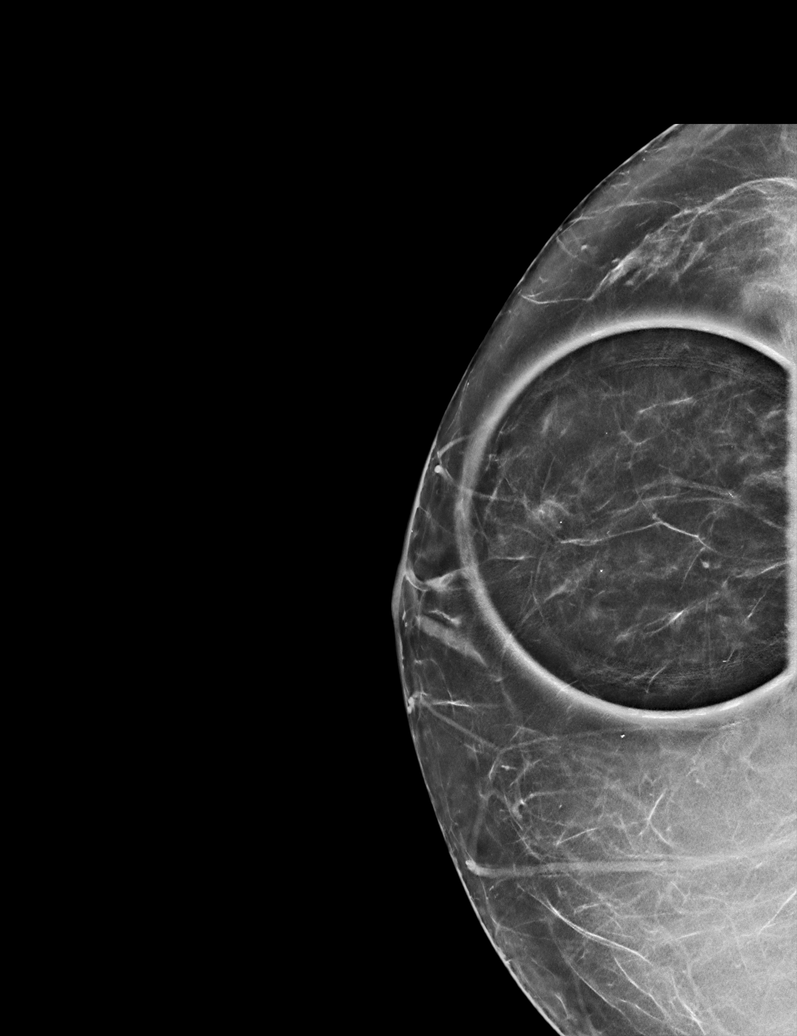

[6 of 21 positions shown; findings below may reference images not displayed]

ACR Breast Density Category b: There are scattered areas of
fibroglandular density.
FINDINGS: Whole breast and spot compression images including tomography show
no persistent asymmetry, mass, or distortion. The area of concern on
the recent screening mammogram is most consistent with overlap of
fibroglandular tissue.

Mammographic images were processed with CAD.
IMPRESSION: No evidence of malignancy in the right breast.

RECOMMENDATION:
Screening mammogram in one year.(Code:[id number])

I have discussed the findings and recommendations with the patient.
Results were also provided in writing at the conclusion of the
visit. If applicable, a reminder letter will be sent to the patient
regarding the next appointment.

BI-RADS CATEGORY  1: Negative.

## 2018-03-31 DIAGNOSIS — L821 Other seborrheic keratosis: Secondary | ICD-10-CM | POA: Diagnosis not present

## 2018-03-31 DIAGNOSIS — D2261 Melanocytic nevi of right upper limb, including shoulder: Secondary | ICD-10-CM | POA: Diagnosis not present

## 2018-03-31 DIAGNOSIS — D225 Melanocytic nevi of trunk: Secondary | ICD-10-CM | POA: Diagnosis not present

## 2018-03-31 DIAGNOSIS — L918 Other hypertrophic disorders of the skin: Secondary | ICD-10-CM | POA: Diagnosis not present

## 2018-03-31 DIAGNOSIS — X32XXXA Exposure to sunlight, initial encounter: Secondary | ICD-10-CM | POA: Diagnosis not present

## 2018-03-31 DIAGNOSIS — D2272 Melanocytic nevi of left lower limb, including hip: Secondary | ICD-10-CM | POA: Diagnosis not present

## 2018-03-31 DIAGNOSIS — D2271 Melanocytic nevi of right lower limb, including hip: Secondary | ICD-10-CM | POA: Diagnosis not present

## 2018-03-31 DIAGNOSIS — D2262 Melanocytic nevi of left upper limb, including shoulder: Secondary | ICD-10-CM | POA: Diagnosis not present

## 2018-03-31 DIAGNOSIS — L565 Disseminated superficial actinic porokeratosis (DSAP): Secondary | ICD-10-CM | POA: Diagnosis not present

## 2018-05-11 ENCOUNTER — Other Ambulatory Visit: Payer: Self-pay | Admitting: Family Medicine

## 2018-05-11 DIAGNOSIS — Z1231 Encounter for screening mammogram for malignant neoplasm of breast: Secondary | ICD-10-CM

## 2018-05-20 ENCOUNTER — Ambulatory Visit (INDEPENDENT_AMBULATORY_CARE_PROVIDER_SITE_OTHER): Payer: PPO

## 2018-05-20 DIAGNOSIS — Z23 Encounter for immunization: Secondary | ICD-10-CM

## 2018-05-24 ENCOUNTER — Encounter: Payer: Self-pay | Admitting: Gastroenterology

## 2018-05-26 ENCOUNTER — Ambulatory Visit
Admission: RE | Admit: 2018-05-26 | Discharge: 2018-05-26 | Disposition: A | Discharge status: Home / Self Care | Payer: PPO | Source: Ambulatory Visit | Attending: Family Medicine | Admitting: Family Medicine

## 2018-05-26 ENCOUNTER — Encounter: Payer: Self-pay | Admitting: *Deleted

## 2018-05-26 DIAGNOSIS — Z1231 Encounter for screening mammogram for malignant neoplasm of breast: Secondary | ICD-10-CM | POA: Insufficient documentation

## 2018-06-04 ENCOUNTER — Telehealth: Payer: Self-pay | Admitting: Family Medicine

## 2018-06-04 NOTE — Telephone Encounter (Signed)
Left message asking pt to call office  On 06/23/18 with Dr copland see if pt can come in @ 11:20 instead of 10:40.    If she cannot change time that's ok  You may have to give to carrie to change time

## 2018-06-14 NOTE — Telephone Encounter (Signed)
Left message asking pt to call office.  Pt has appointment with dr copland on 11/20 dr copland will be out of office.  See if pt would like to see dr Diona Browner on 11/20,  If pt wants to wait to see dr copland r/s to [facility]

## 2018-06-16 ENCOUNTER — Telehealth: Payer: Self-pay | Admitting: Family Medicine

## 2018-06-16 DIAGNOSIS — M899 Disorder of bone, unspecified: Secondary | ICD-10-CM

## 2018-06-16 DIAGNOSIS — I10 Essential (primary) hypertension: Secondary | ICD-10-CM

## 2018-06-16 DIAGNOSIS — E782 Mixed hyperlipidemia: Secondary | ICD-10-CM

## 2018-06-16 DIAGNOSIS — M949 Disorder of cartilage, unspecified: Secondary | ICD-10-CM

## 2018-06-16 NOTE — Addendum Note (Signed)
Addended by: Ellamae Sia on: 06/16/2018 02:23 PM   Modules accepted: Orders

## 2018-06-16 NOTE — Telephone Encounter (Signed)
-----   Message from Eustace Pen, LPN sent at [id number]/[phone]/[id number]  1:39 PM EST ----- Regarding: Labs 11/14 Lab orders needed. Thank you.  Insurance:  [employer]

## 2018-06-17 ENCOUNTER — Ambulatory Visit (INDEPENDENT_AMBULATORY_CARE_PROVIDER_SITE_OTHER): Payer: PPO

## 2018-06-17 VITALS — BP 172/92 | HR 77 | Temp 97.8°F | Ht 60.5 in | Wt 174.0 lb

## 2018-06-17 DIAGNOSIS — Z0000 Encounter for general adult medical examination without abnormal findings: Secondary | ICD-10-CM | POA: Diagnosis not present

## 2018-06-17 DIAGNOSIS — I10 Essential (primary) hypertension: Secondary | ICD-10-CM

## 2018-06-17 DIAGNOSIS — M949 Disorder of cartilage, unspecified: Secondary | ICD-10-CM

## 2018-06-17 DIAGNOSIS — M899 Disorder of bone, unspecified: Secondary | ICD-10-CM

## 2018-06-17 DIAGNOSIS — E782 Mixed hyperlipidemia: Secondary | ICD-10-CM

## 2018-06-17 LAB — CBC WITH DIFFERENTIAL/PLATELET
BASOS ABS: 0.1 10*3/uL (ref 0.0–0.1)
Basophils Relative: 0.8 % (ref 0.0–3.0)
Eosinophils Absolute: 0.2 10*3/uL (ref 0.0–0.7)
Eosinophils Relative: 2.6 % (ref 0.0–5.0)
HCT: 44.7 % (ref 36.0–46.0)
Hemoglobin: 14.9 g/dL (ref 12.0–15.0)
LYMPHS ABS: 3.2 10*3/uL (ref 0.7–4.0)
Lymphocytes Relative: 44.2 % (ref 12.0–46.0)
MCHC: 33.4 g/dL (ref 30.0–36.0)
MCV: 83.9 fl (ref 78.0–100.0)
MONO ABS: 0.9 10*3/uL (ref 0.1–1.0)
MONOS PCT: 12.7 % — AB (ref 3.0–12.0)
NEUTROS PCT: 39.7 % — AB (ref 43.0–77.0)
Neutro Abs: 2.9 10*3/uL (ref 1.4–7.7)
Platelets: 311 10*3/uL (ref 150.0–400.0)
RBC: 5.33 Mil/uL — AB (ref 3.87–5.11)
RDW: 13.7 % (ref 11.5–15.5)
WBC: 7.2 10*3/uL (ref 4.0–10.5)

## 2018-06-17 LAB — LIPID PANEL
CHOL/HDL RATIO: 4
Cholesterol: 221 mg/dL — ABNORMAL HIGH (ref 0–200)
HDL: 49.4 mg/dL (ref >39.00)
LDL CALC: 144 mg/dL — AB (ref 0–99)
NonHDL: 171.14
TRIGLYCERIDES: 134 mg/dL (ref 0.0–149.0)
VLDL: 26.8 mg/dL (ref 0.0–40.0)

## 2018-06-17 LAB — COMPREHENSIVE METABOLIC PANEL
ALT: 32 U/L (ref 0–35)
AST: 22 U/L (ref 0–37)
Albumin: 4.3 g/dL (ref 3.5–5.2)
Alkaline Phosphatase: 50 U/L (ref 39–117)
BUN: 13 mg/dL (ref 6–23)
CHLORIDE: 104 meq/L (ref 96–112)
CO2: 29 mEq/L (ref 19–32)
CREATININE: 0.71 mg/dL (ref 0.40–1.20)
Calcium: 9.2 mg/dL (ref 8.4–10.5)
GFR: 86.37 mL/min (ref >60.00)
Glucose, Bld: 130 mg/dL — ABNORMAL HIGH (ref 70–99)
POTASSIUM: 4 meq/L (ref 3.5–5.1)
SODIUM: 141 meq/L (ref 135–145)
Total Bilirubin: 0.5 mg/dL (ref 0.2–1.2)
Total Protein: 6.6 g/dL (ref 6.0–8.3)

## 2018-06-17 LAB — VITAMIN D 25 HYDROXY (VIT D DEFICIENCY, FRACTURES): VITD: 41.45 ng/mL (ref 30.00–100.00)

## 2018-06-17 LAB — TSH: TSH: 0.82 u[IU]/mL (ref 0.35–4.50)

## 2018-06-17 NOTE — Progress Notes (Signed)
I reviewed health advisor's note, was available for consultation, and agree with documentation and plan.  

## 2018-06-17 NOTE — Progress Notes (Signed)
Subjective:   Dana Hayes is a 72 y.o. female who presents for Medicare Annual (Subsequent) preventive examination.  Review of Systems:  N/A Cardiac Risk Factors include: advanced age (>51men, >66 women);obesity (BMI >30kg/m2);dyslipidemia;hypertension     Objective:     Vitals: BP (!) 172/92 (BP Location: Left Arm, Patient Position: Sitting, Cuff Size: Normal) Comment: BP medication not taken  Pulse 77   Temp 97.8 F (36.6 C) (Oral)   Ht 5' 0.5" (1.537 m) Comment: no shoes  Wt 174 lb (78.9 kg)   SpO2 98%   BMI 33.42 kg/m   Body mass index is 33.42 kg/m.  Advanced Directives 06/17/2018 06/15/2017 05/18/2017 05/09/2016  Does Patient Have a Medical Advance Directive? Yes Yes Yes Yes  Type of Paramedic of [place];Living will - [place];Living will [place];Living will  Does patient want to make changes to medical advance directive? - - - No - Patient declined  Copy of [place] in Chart? No - copy requested - No - copy requested No - copy requested    Tobacco Social History   Tobacco Use  Smoking Status Never Smoker  Smokeless Tobacco Never Used     Counseling given: No   Clinical Intake:  Pre-visit preparation completed: Yes  Pain : No/denies pain Pain Score: 0-No pain     Nutritional Status: BMI > 30  Obese Nutritional Risks: None Diabetes: No  How often do you need to have someone help you when you read instructions, pamphlets, or other written materials from your doctor or pharmacy?: 1 - Never What is the last grade level you completed in school?: 12th grade + 2 yrs college  Interpreter Needed?: No  Comments: pt lives with spouse Information entered by :: LPinson, LPN  Past Medical History:  Diagnosis Date  . Allergy   . Asthma   . GERD (gastroesophageal reflux disease)   . Hyperlipidemia   . Hypertension   . Obesity    Past Surgical History:  Procedure  Laterality Date  . ABDOMINAL HYSTERECTOMY    . LITHOTRIPSY      TIMES 2 1990   Family History  Problem Relation Age of Onset  . Coronary artery disease Mother   . Diabetes Mother   . Coronary artery disease Brother   . Heart attack Brother   . Breast cancer Maternal Aunt        early 43s   Social History   Socioeconomic History  . Marital status: Married    Spouse name: Not on file  . Number of children: Not on file  . Years of education: Not on file  . Highest education level: Not on file  Occupational History  . Occupation: Health visitor: [employer]  . Financial resource strain: Not on file  . Food insecurity:    Worry: Not on file    Inability: Not on file  . Transportation needs:    Medical: Not on file    Non-medical: Not on file  Tobacco Use  . Smoking status: Never Smoker  . Smokeless tobacco: Never Used  Substance and Sexual Activity  . Alcohol use: Yes    Comment: occassionally a glass of wine or beer  . Drug use: No  . Sexual activity: Yes  Lifestyle  . Physical activity:    Days per week: Not on file    Minutes per session: Not on file  . Stress: Not  on file  Relationships  . Social connections:    Talks on phone: Not on file    Gets together: Not on file    Attends religious service: Not on file    Active member of club or organization: Not on file    Attends meetings of clubs or organizations: Not on file    Relationship status: Not on file  Other Topics Concern  . Not on file  Social History Narrative   REGULAR EXERCISE: YES- WALKS 3-4-DAYS A WEEK      Diet: no fast food, instead veggies, loves baking    Outpatient Encounter Medications as of 06/17/2018  Medication Sig  . albuterol (PROVENTIL HFA;VENTOLIN HFA) 108 (90 Base) MCG/ACT inhaler Inhale 2 puffs into the lungs every 6 (six) hours as needed for wheezing or shortness of breath.  . AMBULATORY NON FORMULARY MEDICATION Medication Name: Compounded Estrogen 0.02  (Estradiol)  [employer] (Patient taking differently: Medication Name: Compounded Estrogen 0.02 (Estradiol)  [employer])  . amLODipine (NORVASC) 5 MG tablet Take 1 tablet (5 mg total) by mouth daily.  Marland Kitchen aspirin 81 MG tablet Take 81 mg by mouth daily.  . cetirizine (ZYRTEC) 10 MG tablet Take 10 mg daily by mouth.  . cholecalciferol (VITAMIN D) 1000 UNITS tablet Take 1,000 Units by mouth daily.  Marland Kitchen esomeprazole (NEXIUM) 20 MG capsule Take 20 mg by mouth daily at 12 noon.  . finasteride (PROPECIA) 1 MG tablet TAKE ONE TABLET BY MOUTH EVERY DAY  . hydrochlorothiazide (HYDRODIURIL) 25 MG tablet Take 1 tablet (25 mg total) by mouth daily.  Marland Kitchen ibuprofen (ADVIL,MOTRIN) 200 MG tablet Take 200 mg by mouth every 6 (six) hours as needed.  . Multiple Vitamin (MULTIVITAMIN) tablet Take 1 tablet by mouth daily.    . traZODone (DESYREL) 50 MG tablet Take 0.5-1 tablets (25-50 mg total) by mouth at bedtime as needed for sleep.  Marland Kitchen VITAMIN E PO Take 1 tablet daily by mouth.  . rosuvastatin (CRESTOR) 40 MG tablet Take 1 tablet (40 mg total) by mouth daily.   Facility-Administered Encounter Medications as of 06/17/2018  Medication  . albuterol (PROVENTIL) (2.5 MG/3ML) 0.083% nebulizer solution 2.5 mg    Activities of Daily Living In your present state of health, do you have any difficulty performing the following activities: 06/17/2018  Hearing? N  Vision? N  Difficulty concentrating or making decisions? N  Walking or climbing stairs? N  Dressing or bathing? N  Doing errands, shopping? N  Preparing Food and eating ? N  Using the Toilet? N  In the past six months, have you accidently leaked urine? N  Do you have problems with loss of bowel control? N  Managing your Medications? N  Managing your Finances? N  Housekeeping or managing your Housekeeping? N  Some recent data might be hidden    Patient Care Team: Owens Loffler, MD as PCP - General Oneta Rack, MD as Consulting  Physician (Dermatology)    Assessment:   This is a routine wellness examination for [name].   Hearing Screening   125Hz  250Hz  500Hz  1000Hz  2000Hz  3000Hz  4000Hz  6000Hz  8000Hz   Right ear:   40 40 40  40    Left ear:   40 40 40  40    Vision Screening Comments: Vision exam in Nov 2019 @ [facility]/Dr. George Ina   Exercise Activities and Dietary recommendations Current Exercise Habits: Home exercise routine, Type of exercise: walking;Other - see comments(gym 60 min/3x week; walking 20 min/daily),  Time (Minutes): 60, Frequency (Times/Week): 3, Weekly Exercise (Minutes/Week): 180, Intensity: Moderate, Exercise limited by: None identified  Goals    . Increase physical activity     Starting 06/17/2018, I will continue to exercise at least 60 min 3 days per week and to walk 20 minutes daily.        Fall Risk Fall Risk  06/17/2018 05/18/2017 07/15/2016 05/09/2016 05/09/2015  Falls in the past year? 0 No No No No   Depression Screen PHQ 2/9 Scores 06/17/2018 05/18/2017 07/15/2016 05/09/2016  PHQ - 2 Score 0 0 0 0  PHQ- 9 Score 0 0 - -     Cognitive Function MMSE - Mini Mental State Exam 06/17/2018 05/18/2017 05/09/2016  Orientation to time 5 5 5   Orientation to Place 5 5 5   Registration 3 3 3   Attention/ Calculation 0 0 0  Recall 3 3 3   Language- name 2 objects 0 0 0  Language- repeat 1 1 1   Language- follow 3 step command 3 3 3   Language- read & follow direction 0 0 0  Write a sentence 0 0 0  Copy design 0 0 0  Total score 20 20 20      PLEASE NOTE: A Mini-Cog screen was completed. Maximum score is 20. A value of 0 denotes this part of Folstein MMSE was not completed or the patient failed this part of the Mini-Cog screening.   Mini-Cog Screening Orientation to Time - Max 5 pts Orientation to Place - Max 5 pts Registration - Max 3 pts Recall - Max 3 pts Language Repeat - Max 1 pts Language Follow 3 Step Command - Max 3 pts     Immunization History  Administered  Date(s) Administered  . Influenza Split 06/09/2011  . Influenza Whole 06/04/2008, 06/12/2009, 06/05/2010  . Influenza, High Dose Seasonal PF 05/07/2015  . Influenza,inj,Quad PF,6+ Mos 07/05/2013, 05/10/2014, 05/09/2016, 05/18/2017, 05/20/2018  . Pneumococcal Conjugate-13 03/02/2014  . Pneumococcal Polysaccharide-23 05/09/2016  . Tdap 08/23/2012  . Zoster 07/11/2008    Screening Tests Health Maintenance  Topic Date Due  . COLONOSCOPY  08/04/2019 (Originally 12/27/2017)  . MAMMOGRAM  05/26/2020  . TETANUS/TDAP  08/23/2022  . INFLUENZA VACCINE  Completed  . DEXA SCAN  Completed  . Hepatitis C Screening  Completed  . PNA vac Low Risk Adult  Completed      Plan:     I have personally reviewed, addressed, and noted the following in the patient's chart:  A. Medical and social history B. Use of alcohol, tobacco or illicit drugs  C. Current medications and supplements D. Functional ability and status E.  Nutritional status F.  Physical activity G. Advance directives H. List of other physicians I.  Hospitalizations, surgeries, and ER visits in previous 12 months J.  [place] to include hearing, vision, cognitive, depression L. Referrals and appointments - none  In addition, I have reviewed and discussed with patient certain preventive protocols, quality metrics, and best practice recommendations. A written personalized care plan for preventive services as well as general preventive health recommendations were provided to patient.  See attached scanned questionnaire for additional information.   Signed,   Lindell Noe, MHA, BS, LPN Health Coach

## 2018-06-17 NOTE — Progress Notes (Signed)
PCP notes:   Health maintenance:  Colon cancer screening - addressed; pt plans to call GI to schedule appt  Abnormal screenings:   None  Patient concerns:   None  Nurse concerns:  BP elevated @ 172/92. Patient is alert and oriented. Denies dizziness, headache, or blurred vision. States she has not taken medication due to fasting labs.   Next PCP appt:   06/23/18 @ 1115

## 2018-06-17 NOTE — Patient Instructions (Signed)
Ms. Parrella , Thank you for taking time to come for your Medicare Wellness Visit. I appreciate your ongoing commitment to your health goals. Please review the following plan we discussed and let me know if I can assist you in the future.   These are the goals we discussed: Goals    . Increase physical activity     Starting 06/17/2018, I will continue to exercise at least 60 min 3 days per week and to walk 20 minutes daily.        This is a list of the screening recommended for you and due dates:  Health Maintenance  Topic Date Due  . Colon Cancer Screening  08/04/2019*  . Mammogram  05/26/2020  . Tetanus Vaccine  08/23/2022  . Flu Shot  Completed  . DEXA scan (bone density measurement)  Completed  .  Hepatitis C: One time screening is recommended by Center for Disease Control  (CDC) for  adults born from [birth year] through 1963.   Completed  . Pneumonia vaccines  Completed  *Topic was postponed. The date shown is not the original due date.   Preventive Care for Adults  A healthy lifestyle and preventive care can promote health and wellness. Preventive health guidelines for adults include the following key practices.  . A routine yearly physical is a good way to check with your health care provider about your health and preventive screening. It is a chance to share any concerns and updates on your health and to receive a thorough exam.  . Visit your dentist for a routine exam and preventive care every 6 months. Brush your teeth twice a day and floss once a day. Good oral hygiene prevents tooth decay and gum disease.  . The frequency of eye exams is based on your age, health, family medical history, use  of contact lenses, and other factors. Follow your health care provider's recommendations for frequency of eye exams.  . Eat a healthy diet. Foods like vegetables, fruits, whole grains, low-fat dairy products, and lean protein foods contain the nutrients you need without too many calories.  Decrease your intake of foods high in solid fats, added sugars, and salt. Eat the right amount of calories for you. Get information about a proper diet from your health care provider, if necessary.  . Regular physical exercise is one of the most important things you can do for your health. Most adults should get at least 150 minutes of moderate-intensity exercise (any activity that increases your heart rate and causes you to sweat) each week. In addition, most adults need muscle-strengthening exercises on 2 or more days a week.  Silver Sneakers may be a benefit available to you. To determine eligibility, you may visit the website: www.silversneakers.com or contact program at 7473456157 Mon-Fri between 8AM-8PM.   . Maintain a healthy weight. The body mass index (BMI) is a screening tool to identify possible weight problems. It provides an estimate of body fat based on height and weight. Your health care provider can find your BMI and can help you achieve or maintain a healthy weight.   For adults 20 years and older: ? A BMI below 18.5 is considered underweight. ? A BMI of 18.5 to 24.9 is normal. ? A BMI of 25 to 29.9 is considered overweight. ? A BMI of 30 and above is considered obese.   . Maintain normal blood lipids and cholesterol levels by exercising and minimizing your intake of saturated fat. Eat a balanced diet with plenty of  fruit and vegetables. Blood tests for lipids and cholesterol should begin at age 93 and be repeated every 5 years. If your lipid or cholesterol levels are high, you are over 50, or you are at high risk for heart disease, you may need your cholesterol levels checked more frequently. Ongoing high lipid and cholesterol levels should be treated with medicines if diet and exercise are not working.  . If you smoke, find out from your health care provider how to quit. If you do not use tobacco, please do not start.  . If you choose to drink alcohol, please do not consume  more than 2 drinks per day. One drink is considered to be 12 ounces (355 mL) of beer, 5 ounces (148 mL) of wine, or 1.5 ounces (44 mL) of liquor.  . If you are 74-37 years old, ask your health care provider if you should take aspirin to prevent strokes.  . Use sunscreen. Apply sunscreen liberally and repeatedly throughout the day. You should seek shade when your shadow is shorter than you. Protect yourself by wearing long sleeves, pants, a wide-brimmed hat, and sunglasses year round, whenever you are outdoors.  . Once a month, do a whole body skin exam, using a mirror to look at the skin on your back. Tell your health care provider of new moles, moles that have irregular borders, moles that are larger than a pencil eraser, or moles that have changed in shape or color.

## 2018-06-23 ENCOUNTER — Encounter: Payer: Self-pay | Admitting: Family Medicine

## 2018-06-23 ENCOUNTER — Ambulatory Visit (INDEPENDENT_AMBULATORY_CARE_PROVIDER_SITE_OTHER): Payer: PPO | Admitting: Family Medicine

## 2018-06-23 ENCOUNTER — Encounter: Payer: PPO | Admitting: Family Medicine

## 2018-06-23 VITALS — BP 138/88 | HR 87 | Temp 98.0°F | Ht 61.0 in | Wt 173.0 lb

## 2018-06-23 DIAGNOSIS — E669 Obesity, unspecified: Secondary | ICD-10-CM | POA: Insufficient documentation

## 2018-06-23 DIAGNOSIS — R7303 Prediabetes: Secondary | ICD-10-CM

## 2018-06-23 DIAGNOSIS — I10 Essential (primary) hypertension: Secondary | ICD-10-CM

## 2018-06-23 DIAGNOSIS — Z0000 Encounter for general adult medical examination without abnormal findings: Secondary | ICD-10-CM | POA: Diagnosis not present

## 2018-06-23 DIAGNOSIS — Z6832 Body mass index (BMI) 32.0-32.9, adult: Secondary | ICD-10-CM | POA: Diagnosis not present

## 2018-06-23 DIAGNOSIS — E6609 Other obesity due to excess calories: Secondary | ICD-10-CM

## 2018-06-23 DIAGNOSIS — E785 Hyperlipidemia, unspecified: Secondary | ICD-10-CM

## 2018-06-23 DIAGNOSIS — E119 Type 2 diabetes mellitus without complications: Secondary | ICD-10-CM | POA: Insufficient documentation

## 2018-06-23 DIAGNOSIS — E1169 Type 2 diabetes mellitus with other specified complication: Secondary | ICD-10-CM | POA: Insufficient documentation

## 2018-06-23 LAB — POCT GLYCOSYLATED HEMOGLOBIN (HGB A1C): HEMOGLOBIN A1C: 6.5 % — AB (ref 4.0–5.6)

## 2018-06-23 MED ORDER — ATORVASTATIN CALCIUM 10 MG PO TABS: 10.0000 mg | ORAL_TABLET | Freq: Every day | ORAL | 11 refills | Status: DC

## 2018-06-23 NOTE — Assessment & Plan Note (Signed)
Encouraged exercise, weight loss, healthy eating habits. ? ?

## 2018-06-23 NOTE — Progress Notes (Signed)
Subjective:    Patient ID: Dana Hayes, female    DOB: 08/04/1946, 72 y.o.   MRN: [id number]  HPI The patient presents for  complete physical and review of chronic health problems. He/She also has the following acute concerns today: none  The patient saw Candis Musa, LPN for medicare wellness. Note reviewed in detail and important notes copied below.  Health maintenance:  Colon cancer screening - addressed; pt plans to call GI to schedule appt  Abnormal screenings:   None  Patient concerns:   None  Nurse concerns:  BP elevated @ 172/92. Patient is alert and oriented. Denies dizziness, headache, or blurred vision. States she has not taken medication due to fasting labs.    06/23/18 TODAY  Hypertension:   Good control today after taking her medications.   BP Readings from Last 3 Encounters:  06/23/18 138/88  06/17/18 (!) 172/92  07/03/17 (!) 146/90  Using medication without problems or lightheadedness: none Chest pain with exertion:none Edema:none Short of breath:none Average home BPs: Other issues:      Office Visit from 06/23/2018 in [place] at [facility] Total Score  0        Elevated Cholesterol:  Improving in last year... Dropped 30 points.Still 10 year AHA risk at 15%.. Needs statin. Lab Results  Component Value Date   CHOL 221 (H) 06/17/2018   HDL 49.40 06/17/2018   LDLCALC 144 (H) 06/17/2018   LDLDIRECT 162.6 05/29/2010   TRIG 134.0 06/17/2018   CHOLHDL 4 06/17/2018  Diet compliance: moderate Exercise:  Gym 3 days a week. Other complaints:   Social History /Family History/Past Medical History reviewed in detail and updated in EMR if needed. Blood pressure 138/88, pulse 87, temperature 98 F (36.7 C), temperature source Oral, height 5\' 1"  (1.549 m), weight 173 lb (78.5 kg).    Review of Systems  Constitutional: Negative for fatigue and fever.  HENT: Negative for congestion.   Eyes: Negative for pain.    Respiratory: Negative for cough and shortness of breath.   Cardiovascular: Negative for chest pain, palpitations and leg swelling.  Gastrointestinal: Negative for abdominal pain.  Genitourinary: Negative for dysuria and vaginal bleeding.  Musculoskeletal: Negative for back pain.  Neurological: Negative for syncope, light-headedness and headaches.  Psychiatric/Behavioral: Negative for dysphoric mood.   Body mass index is 32.69 kg/m.     Objective:   Physical Exam  Constitutional: Vital signs are normal. She appears well-developed and well-nourished. She is cooperative.  Non-toxic appearance. She does not appear ill. No distress.  Morbid obesity   HENT:  Head: Normocephalic.  Right Ear: Hearing, tympanic membrane, external ear and ear canal normal.  Left Ear: Hearing, tympanic membrane, external ear and ear canal normal.  Nose: Nose normal.  Eyes: Pupils are equal, round, and reactive to light. Conjunctivae, EOM and lids are normal. Lids are everted and swept, no foreign bodies found.  Neck: Trachea normal and normal range of motion. Neck supple. Carotid bruit is not present. No thyroid mass and no thyromegaly present.  Cardiovascular: Normal rate, regular rhythm, S1 normal, S2 normal, normal heart sounds and intact distal pulses. Exam reveals no gallop.  No murmur heard. Pulmonary/Chest: Effort normal and breath sounds normal. No respiratory distress. She has no wheezes. She has no rhonchi. She has no rales.  Abdominal: Soft. Normal appearance and bowel sounds are normal. She exhibits no distension, no fluid wave, no abdominal bruit and no mass. There is no hepatosplenomegaly. There is no tenderness.  There is no rebound, no guarding and no CVA tenderness. No hernia.  Lymphadenopathy:    She has no cervical adenopathy.    She has no axillary adenopathy.  Neurological: She is alert. She has normal strength. No cranial nerve deficit or sensory deficit.  Skin: Skin is warm, dry and  intact. No rash noted.  Psychiatric: Her speech is normal and behavior is normal. Judgment normal. Her mood appears not anxious. Cognition and memory are normal. She does not exhibit a depressed mood.          Assessment & Plan:  The patient's preventative maintenance and recommended screening tests for an annual wellness exam were reviewed in full today. Brought up to date unless services declined.  Counselled on the importance of diet, exercise, and its role in overall health and mortality. The patient's FH and SH was reviewed, including their home life, tobacco status, and drug and alcohol status.   Vaccines: Td, FLU, PNA 13. 23 uptodate Pap/DVE: pap not indicated Mammo: 05/2018 Bone Density:06/2016 osteopenia Colon: 12/2007 repeat in 10 years..  Planning in early 2020 Smoking Status:none ETOH/ drug use: rare/none  Hep C:  done

## 2018-06-23 NOTE — Assessment & Plan Note (Signed)
AHA 10 year risk at 15%... Start moderate intensity statin... Trial of atorvastatin 10 mg daily. Re-eval in 3 months.,

## 2018-06-23 NOTE — Patient Instructions (Addendum)
Start atorvastatin 10 mg daily... Take the highest dose you are able to tolerate.  Return in 3 months for labs only OV to recheck cholesterol on the new medications.  Work on low chol  Diet and increase exercise as able.   Work on low carbohydrate diet.. Stop sweets and candy.

## 2018-06-23 NOTE — Assessment & Plan Note (Signed)
Well controlled. Continue current medication.  

## 2018-06-23 NOTE — Assessment & Plan Note (Addendum)
Eval with A1C.. She reports she has had a lot of candy lately with Halloween.  A1C 6.5.Dana Hayes She is hesitant to accept Dx of diabetes... Will have her cut out carbs from diet, work on weight loss and exercise.Dana Hayes Re-eval in 3 months.

## 2018-07-21 ENCOUNTER — Other Ambulatory Visit: Payer: Self-pay | Admitting: Family Medicine

## 2018-08-20 ENCOUNTER — Ambulatory Visit (INDEPENDENT_AMBULATORY_CARE_PROVIDER_SITE_OTHER): Payer: PPO | Admitting: Primary Care

## 2018-08-20 ENCOUNTER — Encounter: Payer: Self-pay | Admitting: Primary Care

## 2018-08-20 VITALS — BP 132/82 | HR 92 | Temp 99.7°F | Ht 60.5 in | Wt 176.0 lb

## 2018-08-20 DIAGNOSIS — J4520 Mild intermittent asthma, uncomplicated: Secondary | ICD-10-CM | POA: Diagnosis not present

## 2018-08-20 DIAGNOSIS — J101 Influenza due to other identified influenza virus with other respiratory manifestations: Secondary | ICD-10-CM

## 2018-08-20 DIAGNOSIS — R05 Cough: Secondary | ICD-10-CM | POA: Diagnosis not present

## 2018-08-20 DIAGNOSIS — R059 Cough, unspecified: Secondary | ICD-10-CM

## 2018-08-20 LAB — POC INFLUENZA A&B (BINAX/QUICKVUE)
INFLUENZA A, POC: NEGATIVE
INFLUENZA B, POC: POSITIVE — AB

## 2018-08-20 MED ORDER — OSELTAMIVIR PHOSPHATE 75 MG PO CAPS: 75.0000 mg | ORAL_CAPSULE | Freq: Two times a day (BID) | ORAL | 0 refills | Status: AC

## 2018-08-20 MED ORDER — ALBUTEROL SULFATE HFA 108 (90 BASE) MCG/ACT IN AERS: 2.0000 | INHALATION_SPRAY | Freq: Four times a day (QID) | RESPIRATORY_TRACT | 0 refills | Status: DC | PRN

## 2018-08-20 MED ORDER — BENZONATATE 200 MG PO CAPS: 200.0000 mg | ORAL_CAPSULE | Freq: Three times a day (TID) | ORAL | 0 refills | Status: DC | PRN

## 2018-08-20 NOTE — Addendum Note (Signed)
Addended by: Jacqualin Combes on: 08/20/2018 10:13 AM   Modules accepted: Orders

## 2018-08-20 NOTE — Progress Notes (Signed)
Subjective:    Patient ID: Dana Hayes, female    DOB: [birth year], 73 y.o.   MRN: [id number]  HPI  Dana Hayes is a 73 year old female with a history of allergic rhinitis, hypertension, GERD, asthma who presents today with a chief complaint of cough.  She also reports body aches, fatigue, headache. Her symptoms began two days ago. She's been taking Advil, Coricidin, and Tessalon Perles from her husbands prescription. She was on a seven day course of Amoxicillin per her dentist, completed yesterday. Her husband has been sick for the last several days.   Review of Systems  Constitutional: Positive for chills, fatigue and fever.  HENT: Positive for congestion. Negative for sore throat.   Respiratory: Positive for cough and wheezing.   Musculoskeletal: Positive for myalgias.  Neurological: Positive for headaches.       Past Medical History:  Diagnosis Date  . Allergy   . Asthma   . GERD (gastroesophageal reflux disease)   . Hyperlipidemia   . Hypertension   . Obesity      Social History   Socioeconomic History  . Marital status: Married    Spouse name: Not on file  . Number of children: Not on file  . Years of education: Not on file  . Highest education level: Not on file  Occupational History  . Occupation: Health visitor: [employer]  . Financial resource strain: Not on file  . Food insecurity:    Worry: Not on file    Inability: Not on file  . Transportation needs:    Medical: Not on file    Non-medical: Not on file  Tobacco Use  . Smoking status: Never Smoker  . Smokeless tobacco: Never Used  Substance and Sexual Activity  . Alcohol use: Yes    Comment: occassionally a glass of wine or beer  . Drug use: No  . Sexual activity: Yes  Lifestyle  . Physical activity:    Days per week: Not on file    Minutes per session: Not on file  . Stress: Not on file  Relationships  . Social connections:    Talks on phone: Not on file    Gets  together: Not on file    Attends religious service: Not on file    Active member of club or organization: Not on file    Attends meetings of clubs or organizations: Not on file    Relationship status: Not on file  . Intimate partner violence:    Fear of current or ex partner: Not on file    Emotionally abused: Not on file    Physically abused: Not on file    Forced sexual activity: Not on file  Other Topics Concern  . Not on file  Social History Narrative   REGULAR EXERCISE: YES- WALKS 3-4-DAYS A WEEK      Diet: no fast food, instead veggies, loves baking    Past Surgical History:  Procedure Laterality Date  . ABDOMINAL HYSTERECTOMY    . LITHOTRIPSY      TIMES 2 1990    Family History  Problem Relation Age of Onset  . Coronary artery disease Mother   . Diabetes Mother   . Coronary artery disease Brother   . Heart attack Brother   . Breast cancer Maternal Aunt        early 85s    Allergies  Allergen Reactions  . Codeine     REACTION: Nausea  and vomiting  . Ace Inhibitors Cough  . Estradiol Rash    REACTION: rash at site of administration, topical rash only    Current Outpatient Medications on File Prior to Visit  Medication Sig Dispense Refill  . albuterol (PROVENTIL HFA;VENTOLIN HFA) 108 (90 Base) MCG/ACT inhaler Inhale 2 puffs into the lungs every 6 (six) hours as needed for wheezing or shortness of breath. 1 Inhaler 0  . AMBULATORY NON FORMULARY MEDICATION Medication Name: Compounded Estrogen 0.02 (Estradiol)  [employer] (Patient taking differently: Medication Name: Compounded Estrogen 0.02 (Estradiol)  [employer]) 1 Package 11  . amLODipine (NORVASC) 5 MG tablet Take 1 tablet (5 mg total) by mouth daily. 90 tablet 3  . aspirin 81 MG tablet Take 81 mg by mouth daily.    Marland Kitchen atorvastatin (LIPITOR) 10 MG tablet Take 1 tablet (10 mg total) by mouth daily. 30 tablet 11  . cetirizine (ZYRTEC) 10 MG tablet Take 10 mg daily by mouth.    . cholecalciferol  (VITAMIN D) 1000 UNITS tablet Take 1,000 Units by mouth daily.    Marland Kitchen esomeprazole (NEXIUM) 20 MG capsule Take 20 mg by mouth daily at 12 noon.    . finasteride (PROPECIA) 1 MG tablet TAKE ONE TABLET BY MOUTH EVERY DAY 90 tablet 3  . hydrochlorothiazide (HYDRODIURIL) 25 MG tablet Take 1 tablet (25 mg total) by mouth daily. 90 tablet 3  . ibuprofen (ADVIL,MOTRIN) 200 MG tablet Take 200 mg by mouth every 6 (six) hours as needed.    . Multiple Vitamin (MULTIVITAMIN) tablet Take 1 tablet by mouth daily.      . traZODone (DESYREL) 50 MG tablet Take 0.5-1 tablets (25-50 mg total) by mouth at bedtime as needed for sleep. 30 tablet 5  . VITAMIN E PO Take 1 tablet daily by mouth.    . rosuvastatin (CRESTOR) 40 MG tablet Take 1 tablet (40 mg total) by mouth daily. 90 tablet 3   Current Facility-Administered Medications on File Prior to Visit  Medication Dose Route Frequency Provider Last Rate Last Dose  . albuterol (PROVENTIL) (2.5 MG/3ML) 0.083% nebulizer solution 2.5 mg  2.5 mg Nebulization Once Arnett, Yvetta Coder, FNP        BP 132/82   Pulse 92   Temp 99.7 F (37.6 C) (Oral)   Ht 5' 0.5" (1.537 m)   Wt 176 lb (79.8 kg)   SpO2 98%   BMI 33.81 kg/m    Objective:   Physical Exam  Constitutional: She appears well-nourished. She appears ill.  HENT:  Right Ear: Tympanic membrane and ear canal normal.  Left Ear: Tympanic membrane and ear canal normal.  Nose: No mucosal edema. Right sinus exhibits no maxillary sinus tenderness and no frontal sinus tenderness. Left sinus exhibits no maxillary sinus tenderness and no frontal sinus tenderness.  Mouth/Throat: Oropharynx is clear and moist.  Neck: Neck supple.  Cardiovascular: Normal rate and regular rhythm.  Respiratory: Effort normal. She has no decreased breath sounds. She has wheezes in the right upper field and the left upper field. She has rhonchi in the right lower field and the left lower field.  Congested cough during exam  Skin: Skin is  warm and dry.           Assessment & Plan:  Influenza B:  Chills, body aches, low grade fever, cough x 2 days. Appears ill, lungs with mild wheezing.  Rapid Flu: Positive for B. Rx for Tamiflu course sent to pharmacy as she is inside the  window for treatment. Rx for [employer] and Albuterol sent to pharmacy. Fluids, rest, follow up PRN.  Marland KitchenPleas Koch, NP

## 2018-08-20 NOTE — Patient Instructions (Addendum)
Start oseltamivir (Tamiflu) medication for the flu. Take 1 capsule by mouth twice daily for 5 days.  Shortness of Breath/Wheezing/Cough: Use the albuterol inhaler. Inhale 2 puffs into the lungs every 4 to 6 hours as needed for wheezing, cough, and/or shortness of breath.   You may take Benzonatate capsules for cough. Take 1 capsule by mouth three times daily as needed for cough.  You can take Tylenol or Ibuprofen as needed for body aches, fevers, headaches.  Make sure to drink plenty of fluids and rest.  It was a pleasure meeting you!   Influenza, Adult Influenza, more commonly known as "the flu," is a viral infection that mainly affects the respiratory tract. The respiratory tract includes organs that help you breathe, such as the lungs, nose, and throat. The flu causes many symptoms similar to the common cold along with high fever and body aches. The flu spreads easily from person to person (is contagious). Getting a flu shot (influenza vaccination) every year is the best way to prevent the flu. What are the causes? This condition is caused by the influenza virus. You can get the virus by:  Breathing in droplets that are in the air from an infected person's cough or sneeze.  Touching something that has been exposed to the virus (has been contaminated) and then touching your mouth, nose, or eyes. What increases the risk? The following factors may make you more likely to get the flu:  Not washing or sanitizing your hands often.  Having close contact with many people during cold and flu season.  Touching your mouth, eyes, or nose without first washing or sanitizing your hands.  Not getting a yearly (annual) flu shot. You may have a higher risk for the flu, including serious problems such as a lung infection (pneumonia), if you:  Are older than 65.  Are pregnant.  Have a weakened disease-fighting system (immune system). You may have a weakened immune system if you: ? Have HIV or  AIDS. ? Are undergoing chemotherapy. ? Are taking medicines that reduce (suppress) the activity of your immune system.  Have a long-term (chronic) illness, such as heart disease, kidney disease, diabetes, or lung disease.  Have a liver disorder.  Are severely overweight (morbidly obese).  Have anemia. This is a condition that affects your red blood cells.  Have asthma. What are the signs or symptoms? Symptoms of this condition usually begin suddenly and last 4-14 days. They may include:  Fever and chills.  Headaches, body aches, or muscle aches.  Sore throat.  Cough.  Runny or stuffy (congested) nose.  Chest discomfort.  Poor appetite.  Weakness or fatigue.  Dizziness.  Nausea or vomiting. How is this diagnosed? This condition may be diagnosed based on:  Your symptoms and medical history.  A physical exam.  Swabbing your nose or throat and testing the fluid for the influenza virus. How is this treated? If the flu is diagnosed early, you can be treated with medicine that can help reduce how severe the illness is and how long it lasts (antiviral medicine). This may be given by mouth (orally) or through an IV. Taking care of yourself at home can help relieve symptoms. Your health care provider may recommend:  Taking over-the-counter medicines.  Drinking plenty of fluids. In many cases, the flu goes away on its own. If you have severe symptoms or complications, you may be treated in a hospital. Follow these instructions at home: Activity  Rest as needed and get plenty of  sleep.  Stay home from work or school as told by your health care provider. Unless you are visiting your health care provider, avoid leaving home until your fever has been gone for 24 hours without taking medicine. Eating and drinking  Take an oral rehydration solution (ORS). This is a drink that is sold at pharmacies and retail stores.  Drink enough fluid to keep your urine pale  yellow.  Drink clear fluids in small amounts as you are able. Clear fluids include water, ice chips, diluted fruit juice, and low-calorie sports drinks.  Eat bland, easy-to-digest foods in small amounts as you are able. These foods include bananas, applesauce, rice, lean meats, toast, and crackers.  Avoid drinking fluids that contain a lot of sugar or caffeine, such as energy drinks, regular sports drinks, and soda.  Avoid alcohol.  Avoid spicy or fatty foods. General instructions      Take over-the-counter and prescription medicines only as told by your health care provider.  Use a cool mist humidifier to add humidity to the air in your home. This can make it easier to breathe.  Cover your mouth and nose when you cough or sneeze.  Wash your hands with soap and water often, especially after you cough or sneeze. If soap and water are not available, use alcohol-based hand sanitizer.  Keep all follow-up visits as told by your health care provider. This is important. How is this prevented?   Get an annual flu shot. You may get the flu shot in late summer, fall, or winter. Ask your health care provider when you should get your flu shot.  Avoid contact with people who are sick during cold and flu season. This is generally fall and winter. Contact a health care provider if:  You develop new symptoms.  You have: ? Chest pain. ? Diarrhea. ? A fever.  Your cough gets worse.  You produce more mucus.  You feel nauseous or you vomit. Get help right away if:  You develop shortness of breath or difficulty breathing.  Your skin or nails turn a bluish color.  You have severe pain or stiffness in your neck.  You develop a sudden headache or sudden pain in your face or ear.  You cannot eat or drink without vomiting. Summary  Influenza, more commonly known as "the flu," is a viral infection that primarily affects your respiratory tract.  Symptoms of the flu usually begin  suddenly and last 4-14 days.  Getting an annual flu shot is the best way to prevent getting the flu.  Stay home from work or school as told by your health care provider. Unless you are visiting your health care provider, avoid leaving home until your fever has been gone for 24 hours without taking medicine.  Keep all follow-up visits as told by your health care provider. This is important. This information is not intended to replace advice given to you by your health care provider. Make sure you discuss any questions you have with your health care provider. Document Released: 07/18/2000 Document Revised: 01/06/2018 Document Reviewed: 01/06/2018 Elsevier Interactive Patient Education  2019 [employer].

## 2018-09-08 ENCOUNTER — Other Ambulatory Visit: Payer: Self-pay | Admitting: Family Medicine

## 2018-09-19 ENCOUNTER — Encounter: Payer: Self-pay | Admitting: Family Medicine

## 2018-09-19 NOTE — Progress Notes (Deleted)
Dr. Frederico Hamman T. Georgena Weisheit, MD, [facility] Primary Care and Sports Medicine [place], [address] Phone: [phone] Fax: [phone]  09/22/2018  Patient: Dana Hayes, MRN: [id number], DOB: 06-28-1946, 73 y.o.  Primary Physician:  Owens Loffler, MD   No chief complaint on file.  Subjective:   Dana Hayes is a 73 y.o. very pleasant female patient who presents with the following:  Had been reluctant to  Accept DM dx.  Diabetes Mellitus: Tolerating Medications: yes Compliance with diet: fair Exercise: minimal / intermittent Avg blood sugars at home: not checking Foot problems: none Hypoglycemia: none No nausea, vomitting, blurred vision, polyuria.  Lab Results  Component Value Date   HGBA1C 6.5 (A) 06/23/2018   Lab Results  Component Value Date   LDLCALC 144 (H) 06/17/2018   CREATININE 0.71 06/17/2018    Wt Readings from Last 3 Encounters:  08/20/18 176 lb (79.8 kg)  06/23/18 173 lb (78.5 kg)  06/17/18 174 lb (78.9 kg)    There is no height or weight on file to calculate BMI.   Lipids: Doing well, stable. Tolerating meds fine with no SE. Panel reviewed with patient.  Lipids:    Component Value Date/Time   CHOL 221 (H) 06/17/2018 0836   TRIG 134.0 06/17/2018 0836   HDL 49.40 06/17/2018 0836   LDLDIRECT 162.6 05/29/2010 0829   VLDL 26.8 06/17/2018 0836   CHOLHDL 4 06/17/2018 0836    Lab Results  Component Value Date   ALT 32 06/17/2018   AST 22 06/17/2018   ALKPHOS 50 06/17/2018   BILITOT 0.5 06/17/2018    HTN: Tolerating all medications without side effects Stable and at goal No CP, no sob. No HA.  BP Readings from Last 3 Encounters:  08/20/18 132/82  06/23/18 138/88  06/17/18 (!) 785/88    Basic Metabolic Panel:    Component Value Date/Time   NA 141 06/17/2018 0836   K 4.0 06/17/2018 0836   CL 104 06/17/2018 0836   CO2 29 06/17/2018 0836   BUN 13 06/17/2018 0836   CREATININE 0.71 06/17/2018 0836   GLUCOSE 130 (H) 06/17/2018 0836   CALCIUM 9.2 06/17/2018 0836     Past Medical History, Surgical History, Social History, Family History, Problem List, Medications, and Allergies have been reviewed and updated if relevant.  Patient Active Problem List   Diagnosis Date Noted  . Class 1 obesity with serious comorbidity and body mass index (BMI) of 32.0 to 32.9 in adult 06/23/2018  . Prediabetes 06/23/2018  . Chest pain 07/02/2017  . Dermatitis due to plants, including poison ivy, sumac, and oak 03/23/2017  . DIVERTICULOSIS OF COLON 12/28/2007  . Hyperlipidemia 10/21/2007  . Essential hypertension 10/21/2007  . ALLERGIC RHINITIS 10/21/2007  . Mild persistent asthma with acute exacerbation in adult 10/21/2007  . GERD 10/21/2007  . Disorder of bone and cartilage 10/21/2007  . NEPHROLITHIASIS, HX OF 10/21/2007    Past Medical History:  Diagnosis Date  . Allergy   . Asthma   . GERD (gastroesophageal reflux disease)   . Hyperlipidemia   . Hypertension   . Obesity     Past Surgical History:  Procedure Laterality Date  . ABDOMINAL HYSTERECTOMY    . LITHOTRIPSY      TIMES 2 1990    Social History   Socioeconomic History  . Marital status: Married    Spouse name: Not on file  . Number of children: Not on file  . Years of education: Not  on file  . Highest education level: Not on file  Occupational History  . Occupation: Health visitor: [employer]  . Financial resource strain: Not on file  . Food insecurity:    Worry: Not on file    Inability: Not on file  . Transportation needs:    Medical: Not on file    Non-medical: Not on file  Tobacco Use  . Smoking status: Never Smoker  . Smokeless tobacco: Never Used  Substance and Sexual Activity  . Alcohol use: Yes    Comment: occassionally a glass of wine or beer  . Drug use: No  . Sexual activity: Yes  Lifestyle  . Physical activity:    Days per week: Not on file    Minutes per session: Not on  file  . Stress: Not on file  Relationships  . Social connections:    Talks on phone: Not on file    Gets together: Not on file    Attends religious service: Not on file    Active member of club or organization: Not on file    Attends meetings of clubs or organizations: Not on file    Relationship status: Not on file  . Intimate partner violence:    Fear of current or ex partner: Not on file    Emotionally abused: Not on file    Physically abused: Not on file    Forced sexual activity: Not on file  Other Topics Concern  . Not on file  Social History Narrative   REGULAR EXERCISE: YES- WALKS 3-4-DAYS A WEEK      Diet: no fast food, instead veggies, loves baking    Family History  Problem Relation Age of Onset  . Coronary artery disease Mother   . Diabetes Mother   . Coronary artery disease Brother   . Heart attack Brother   . Breast cancer Maternal Aunt        early 58s    Allergies  Allergen Reactions  . Codeine     REACTION: Nausea and vomiting  . Ace Inhibitors Cough  . Estradiol Rash    REACTION: rash at site of administration, topical rash only    Medication list reviewed and updated in full in [place].   GEN: No acute illnesses, no fevers, chills. GI: No n/v/d, eating normally Pulm: No SOB Interactive and getting along well at home.  Otherwise, ROS is as per the HPI.  Objective:   There were no vitals taken for this visit.  GEN: WDWN, NAD, Non-toxic, A & O x 3 HEENT: Atraumatic, Normocephalic. Neck supple. No masses, No LAD. Ears and Nose: No external deformity. CV: RRR, No M/G/R. No JVD. No thrill. No extra heart sounds. PULM: CTA B, no wheezes, crackles, rhonchi. No retractions. No resp. distress. No accessory muscle use. EXTR: No c/c/e NEURO Normal gait.  PSYCH: Normally interactive. Conversant. Not depressed or anxious appearing.  Calm demeanor.   Laboratory and Imaging Data:  Assessment and Plan:   ***

## 2018-09-22 ENCOUNTER — Ambulatory Visit: Payer: PPO | Admitting: Family Medicine

## 2018-10-24 DIAGNOSIS — L03119 Cellulitis of unspecified part of limb: Secondary | ICD-10-CM | POA: Diagnosis not present

## 2018-10-26 DIAGNOSIS — L237 Allergic contact dermatitis due to plants, except food: Secondary | ICD-10-CM | POA: Diagnosis not present

## 2018-12-29 ENCOUNTER — Ambulatory Visit (INDEPENDENT_AMBULATORY_CARE_PROVIDER_SITE_OTHER): Payer: PPO | Admitting: Family Medicine

## 2018-12-29 ENCOUNTER — Other Ambulatory Visit: Payer: PPO

## 2018-12-29 ENCOUNTER — Encounter: Payer: Self-pay | Admitting: Family Medicine

## 2018-12-29 VITALS — BP 166/99 | HR 90 | Temp 98.1°F | Ht 60.5 in | Wt 175.5 lb

## 2018-12-29 DIAGNOSIS — R3915 Urgency of urination: Secondary | ICD-10-CM | POA: Diagnosis not present

## 2018-12-29 DIAGNOSIS — N3090 Cystitis, unspecified without hematuria: Secondary | ICD-10-CM | POA: Diagnosis not present

## 2018-12-29 LAB — POC URINALSYSI DIPSTICK (AUTOMATED)
Bilirubin, UA: NEGATIVE
Blood, UA: NEGATIVE
Glucose, UA: NEGATIVE
Ketones, UA: NEGATIVE
Leukocytes, UA: NEGATIVE
Nitrite, UA: NEGATIVE
Protein, UA: NEGATIVE
Spec Grav, UA: 1.01 (ref 1.010–1.025)
Urobilinogen, UA: 0.2 E.U./dL
pH, UA: 6 (ref 5.0–8.0)

## 2018-12-29 MED ORDER — SULFAMETHOXAZOLE-TRIMETHOPRIM 800-160 MG PO TABS: 1.0000 | ORAL_TABLET | Freq: Two times a day (BID) | ORAL | 0 refills | Status: AC

## 2018-12-29 NOTE — Progress Notes (Signed)
Dana Armor T. [name], MD Primary Care and [place] at [facility] [place], [address] Phone: [phone]  FAX: [place] - 73 y.o. female  MRN [id number]  Date of Birth: [birth year]  Visit Date: 12/29/2018  PCP: Owens Loffler, MD  Referred by: Owens Loffler, MD Chief Complaint  Patient presents with  . Urinary Urgency  . Abdominal Pain    Lower Stomach  . Fatigue   Virtual Visit via Video Note:  I connected with  [name] on 12/29/2018  2:00 PM EDT by a video enabled telemedicine application and verified that I am speaking with the correct person using two identifiers.   Location patient: home computer, tablet, or smartphone Location provider: work or home office Consent: Verbal consent directly obtained from Dana Hayes. Persons participating in the virtual visit: patient, provider  I discussed the limitations of evaluation and management by telemedicine and the availability of in person appointments. The patient expressed understanding and agreed to proceed.  History of Present Illness:  UTI She is a well-known patient.  She has been having some dysuria and urgency as well as some suprapubic pain for the past few days.  She also has some low back pain and she has been doing some yard work over the weekend.  She denies any hematuria.  She is not having any nausea, vomiting, or systemic fevers.  azo  Review of Systems as above: See pertinent positives and pertinent negatives per HPI No acute distress verbally  Past Medical History, Surgical History, Social History, Family History, Problem List, Medications, and Allergies have been reviewed and updated if relevant.   Observations/Objective/Exam:  An attempt was made to discern vital signs over the phone and per patient if applicable and possible.   General:    Alert, Oriented, appears well and in no acute distress HEENT:   Atraumatic, conjunctiva clear, no obvious abnormalities on inspection of external nose and ears.  Neck:    Normal movements of the head and neck Pulmonary:     On inspection no signs of respiratory distress, breathing rate appears normal, no obvious gross SOB, gasping or wheezing Cardiovascular:    No obvious cyanosis Musculoskeletal:    Moves all visible extremities without noticeable abnormality Psych / Neurological:     Pleasant and cooperative, no obvious depression or anxiety, speech and thought processing grossly intact  Results for orders placed or performed in visit on 12/29/18  POCT Urinalysis Dipstick (Automated)  Result Value Ref Range   Color, UA Yellow    Clarity, UA Clear    Glucose, UA Negative Negative   Bilirubin, UA Negative    Ketones, UA Negative    Spec Grav, UA 1.010 1.010 - 1.025   Blood, UA Negative    pH, UA 6.0 5.0 - 8.0   Protein, UA Negative Negative   Urobilinogen, UA 0.2 0.2 or 1.0 E.U./dL   Nitrite, UA Negative    Leukocytes, UA Negative Negative     Assessment and Plan:  Cystitis  Urinary urgency - Plan: POCT Urinalysis Dipstick (Automated), Urine Culture  She is taking AZO, which would mask her urinalysis.  Check urine culture.  Clinically history sounds as if UTI.  I discussed the assessment and treatment plan with the patient. The patient was provided an opportunity to ask questions and all were answered. The patient agreed with the plan and demonstrated an understanding of the instructions.   The patient  was advised to call back or seek an in-person evaluation if the symptoms worsen or if the condition fails to improve as anticipated.  Follow-up: prn unless noted otherwise below No follow-ups on file.  Meds ordered this encounter  Medications  . sulfamethoxazole-trimethoprim (BACTRIM DS) 800-160 MG tablet    Sig: Take 1 tablet by mouth 2 (two) times daily for 7 days.    Dispense:  14 tablet    Refill:  0   Orders Placed This  Encounter  Procedures  . Urine Culture  . POCT Urinalysis Dipstick (Automated)    Signed,  Amazin Pincock T. Tyler Cubit, MD

## 2018-12-30 LAB — URINE CULTURE
MICRO NUMBER:: 510117
SPECIMEN QUALITY:: ADEQUATE

## 2018-12-31 ENCOUNTER — Telehealth: Payer: Self-pay | Admitting: Family Medicine

## 2018-12-31 NOTE — Telephone Encounter (Signed)
I spoke with Dana Hayes and advised that if she feels the antibiotics have helped her symptoms, I would go ahead and complete the full course.  I advised that she call us back if symptoms are not resolved after she completes the antibiotics.  Patient states understanding.

## 2018-12-31 NOTE — Telephone Encounter (Signed)
Best number [phone] Pt called wanting to know Does she needs to continue taking antibiotics that dr copland prescribed for her

## 2019-02-24 ENCOUNTER — Ambulatory Visit (INDEPENDENT_AMBULATORY_CARE_PROVIDER_SITE_OTHER): Payer: PPO | Admitting: Family Medicine

## 2019-02-24 ENCOUNTER — Other Ambulatory Visit: Payer: Self-pay

## 2019-02-24 ENCOUNTER — Ambulatory Visit (INDEPENDENT_AMBULATORY_CARE_PROVIDER_SITE_OTHER)
Admission: RE | Admit: 2019-02-24 | Discharge: 2019-02-24 | Disposition: A | Discharge status: Home / Self Care | Payer: PPO | Source: Ambulatory Visit | Attending: Family Medicine | Admitting: Family Medicine

## 2019-02-24 ENCOUNTER — Telehealth: Payer: Self-pay

## 2019-02-24 ENCOUNTER — Encounter: Payer: Self-pay | Admitting: Family Medicine

## 2019-02-24 VITALS — BP 144/100 | HR 86 | Temp 98.3°F | Ht 60.5 in | Wt 170.5 lb

## 2019-02-24 DIAGNOSIS — M25572 Pain in left ankle and joints of left foot: Secondary | ICD-10-CM | POA: Diagnosis not present

## 2019-02-24 DIAGNOSIS — M8580 Other specified disorders of bone density and structure, unspecified site: Secondary | ICD-10-CM | POA: Insufficient documentation

## 2019-02-24 DIAGNOSIS — S99912A Unspecified injury of left ankle, initial encounter: Secondary | ICD-10-CM | POA: Diagnosis not present

## 2019-02-24 MED ORDER — IBUPROFEN 800 MG PO TABS
800.0000 mg | ORAL_TABLET | Freq: Three times a day (TID) | ORAL | 0 refills | Status: DC | PRN
Start: 2019-02-24 — End: 2019-06-02

## 2019-02-24 NOTE — Telephone Encounter (Signed)
Pt already has appt with Dr Diona Browner this morning at 9:40.

## 2019-02-24 NOTE — Telephone Encounter (Signed)
[place] Night - Client Nonclinical Telephone Record [facility] Client [place] Night - Client Client Site [place] Physician Owens Loffler - MD Contact Type Call Who Is Calling Patient / Member / Family / Caregiver Caller Name [place] Phone Number [phone] Patient Name Dana Hayes Patient DOB [birth year] Call Type Message Only Information Provided Reason for Call Request to Schedule Office Appointment Initial Comment Caller is needing appt for leg pain. Declined triage. Additional Comment Call Closed By: Silvano Rusk Transaction Date/Time: 02/24/2019 7:44:55 AM (ET

## 2019-02-24 NOTE — Progress Notes (Signed)
Chief Complaint  Patient presents with  . Ankle Pain    C/o left ankle pain.  Pt stepped in a rut when walking dog yesterday morning.  Says she heard "something pop". Area swollen.     History of Present Illness: HPI   73 year old female presents with injury to left ankle.  She reports that yesterday she twisted her ankle ( forced eversion) when walking dog.  At that time she had sharp lateral ankle pain and heard a pop.   Now with swelling and pain at the site.   Weight bearing:  Was able to do initial, able to weight bear now.   Iced, Advil, elevation.. pain is worse now.  2017 DEXA: osteopenia t-2.0  COVID 19 screen No recent travel or known exposure to [place] The patient denies respiratory symptoms of COVID 19 at this time.  The importance of social distancing was discussed today.   Review of Systems  Constitutional: Negative for chills and fever.  HENT: Negative for congestion and ear pain.   Eyes: Negative for pain and redness.  Respiratory: Negative for cough and shortness of breath.   Cardiovascular: Negative for chest pain, palpitations and leg swelling.  Gastrointestinal: Negative for abdominal pain, blood in stool, constipation, diarrhea, nausea and vomiting.  Genitourinary: Negative for dysuria.  Musculoskeletal: Negative for falls and myalgias.  Skin: Negative for rash.  Neurological: Negative for dizziness.  Psychiatric/Behavioral: Negative for depression. The patient is not nervous/anxious.       Past Medical History:  Diagnosis Date  . Allergy   . Asthma   . GERD (gastroesophageal reflux disease)   . Hyperlipidemia   . Hypertension   . Obesity     reports that she has never smoked. She has never used smokeless tobacco. She reports current alcohol use. She reports that she does not use drugs.   Current Outpatient Medications:  .  albuterol (PROVENTIL HFA;VENTOLIN HFA) 108 (90 Base) MCG/ACT inhaler, Inhale 2 puffs into the lungs every 6 (six) hours  as needed for wheezing or shortness of breath., Disp: 1 Inhaler, Rfl: 0 .  amLODipine (NORVASC) 5 MG tablet, TAKE 1 TABLET BY MOUTH ONCE DAILY, Disp: 90 tablet, Rfl: 1 .  aspirin 81 MG tablet, Take 81 mg by mouth daily., Disp: , Rfl:  .  cetirizine (ZYRTEC) 10 MG tablet, Take 10 mg daily by mouth., Disp: , Rfl:  .  cholecalciferol (VITAMIN D) 1000 UNITS tablet, Take 1,000 Units by mouth daily., Disp: , Rfl:  .  esomeprazole (NEXIUM) 20 MG capsule, Take 20 mg by mouth daily at 12 noon., Disp: , Rfl:  .  finasteride (PROPECIA) 1 MG tablet, TAKE ONE TABLET BY MOUTH EVERY DAY, Disp: 90 tablet, Rfl: 3 .  hydrochlorothiazide (HYDRODIURIL) 25 MG tablet, TAKE 1 TABLET BY MOUTH ONCE DAILY, Disp: 90 tablet, Rfl: 1 .  ibuprofen (ADVIL,MOTRIN) 200 MG tablet, Take 200 mg by mouth every 6 (six) hours as needed., Disp: , Rfl:  .  Multiple Vitamin (MULTIVITAMIN) tablet, Take 1 tablet by mouth daily.  , Disp: , Rfl:  .  VITAMIN E PO, Take 1 tablet daily by mouth., Disp: , Rfl:  .  atorvastatin (LIPITOR) 10 MG tablet, Take 1 tablet (10 mg total) by mouth daily. (Patient not taking: Reported on 12/29/2018), Disp: 30 tablet, Rfl: 11  Current Facility-Administered Medications:  .  albuterol (PROVENTIL) (2.5 MG/3ML) 0.083% nebulizer solution 2.5 mg, 2.5 mg, Nebulization, Once, Arnett, Yvetta Coder, FNP   Observations/Objective: Blood pressure (!) 144/100,  pulse 86, temperature 98.3 F (36.8 C), temperature source Temporal, height 5' 0.5" (1.537 m), weight 170 lb 8 oz (77.3 kg), SpO2 96 %.  Physical Exam Constitutional:      General: She is not in acute distress.    Appearance: Normal appearance. She is well-developed. She is not ill-appearing or toxic-appearing.  HENT:     Head: Normocephalic.     Right Ear: Hearing, tympanic membrane, ear canal and external ear normal. Tympanic membrane is not erythematous, retracted or bulging.     Left Ear: Hearing, tympanic membrane, ear canal and external ear normal. Tympanic  membrane is not erythematous, retracted or bulging.     Nose: No mucosal edema or rhinorrhea.     Right Sinus: No maxillary sinus tenderness or frontal sinus tenderness.     Left Sinus: No maxillary sinus tenderness or frontal sinus tenderness.     Mouth/Throat:     Pharynx: Uvula midline.  Eyes:     General: Lids are normal. Lids are everted, no foreign bodies appreciated.     Conjunctiva/sclera: Conjunctivae normal.     Pupils: Pupils are equal, round, and reactive to light.  Neck:     Musculoskeletal: Normal range of motion and neck supple.     Thyroid: No thyroid mass or thyromegaly.     Vascular: No carotid bruit.     Trachea: Trachea normal.  Cardiovascular:     Rate and Rhythm: Normal rate and regular rhythm.     Pulses: Normal pulses.     Heart sounds: Normal heart sounds, S1 normal and S2 normal. No murmur. No friction rub. No gallop.   Pulmonary:     Effort: Pulmonary effort is normal. No tachypnea or respiratory distress.     Breath sounds: Normal breath sounds. No decreased breath sounds, wheezing, rhonchi or rales.  Abdominal:     General: Bowel sounds are normal.     Palpations: Abdomen is soft.     Tenderness: There is no abdominal tenderness.  Musculoskeletal:     Left ankle: She exhibits decreased range of motion, swelling, ecchymosis and deformity. Tenderness. Lateral malleolus tenderness found. No medial malleolus, no posterior TFL, no head of 5th metatarsal and no proximal fibula tenderness found. Achilles tendon normal.  Skin:    General: Skin is warm and dry.     Findings: No rash.  Neurological:     Mental Status: She is alert.  Psychiatric:        Mood and Affect: Mood is not anxious or depressed.        Speech: Speech normal.        Behavior: Behavior normal. Behavior is cooperative.        Thought Content: Thought content normal.        Judgment: Judgment normal.      Assessment and Plan      Eliezer Lofts, MD

## 2019-02-24 NOTE — Assessment & Plan Note (Signed)
Likely ankle sprain. .. but given osteopenia and pain with weight bearing... eval with X-ray.  Treat with NSAIDS, PT, elevation, ice and aircast.  Follow up in 2 weeks if not improving as exepected... if improving advance to lace up brace.

## 2019-02-24 NOTE — Patient Instructions (Addendum)
We will call with X-ray results.  Wear air-cast when on feet. Advance in 1-2 weeks to lace up ankle brace.  Start home PT.  Continue ice, elevation and antiinflammatories like ibuprofen 800 mg three times daily.  Follow up in 2 weeks with PCP if not improving as expected.   Ankle Sprain, Phase I Rehab An ankle sprain is an injury to the ligaments of your ankle. Ankle sprains cause stiffness, loss of motion, and loss of strength. Ask your health care provider which exercises are safe for you. Do exercises exactly as told by your health care provider and adjust them as directed. It is normal to feel mild stretching, pulling, tightness, or discomfort as you do these exercises. Stop right away if you feel sudden pain or your pain gets worse. Do not begin these exercises until told by your health care provider. Stretching and range-of-motion exercises These exercises warm up your muscles and joints and improve the movement and flexibility of your lower leg and ankle. These exercises also help to relieve pain and stiffness. Gastroc and soleus stretch This exercise is also called a calf stretch. It stretches the muscles in the back of the lower leg. These muscles are the gastrocnemius, or gastroc, and the soleus. 1. Sit on the floor with your left / right leg extended. 2. Loop a belt or towel around the ball of your left / right foot. The ball of your foot is on the walking surface, right under your toes. 3. Keep your left / right ankle and foot relaxed and keep your knee straight while you use the belt or towel to pull your foot toward you. You should feel a gentle stretch behind your calf or knee in your gastroc muscle. 4. Hold this position for __________ seconds, then release to the starting position. 5. Repeat the exercise with your knee bent. You can put a pillow or a rolled bath towel under your knee to support it. You should feel a stretch deep in your calf in the soleus muscle or at your Achilles  tendon. Repeat __________ times. Complete this exercise __________ times a day. Ankle alphabet  1. Sit with your left / right leg supported at the lower leg. ? Do not rest your foot on anything. ? Make sure your foot has room to move freely. 2. Think of your left / right foot as a paintbrush. ? Move your foot to trace each letter of the alphabet in the air. Keep your hip and knee still while you trace. ? Make the letters as large as you can without feeling discomfort. 3. Trace every letter from A to Z. Repeat __________ times. Complete this exercise __________ times a day. Strengthening exercises These exercises build strength and endurance in your ankle and lower leg. Endurance is the ability to use your muscles for a long time, even after they get tired. Ankle dorsiflexion  1. Secure a rubber exercise band or tube to an object, such as a table leg, that will stay still when the band is pulled. Secure the other end around your left / right foot. 2. Sit on the floor facing the object, with your left / right leg extended. The band or tube should be slightly tense when your foot is relaxed. 3. Slowly bring your foot toward you, bringing the top of your foot toward your shin (dorsiflexion), and pulling the band tighter. 4. Hold this position for __________ seconds. 5. Slowly return your foot to the starting position. Repeat __________ times.  Complete this exercise __________ times a day. Ankle plantar flexion  1. Sit on the floor with your left / right leg extended. 2. Loop a rubber exercise tube or band around the ball of your left / right foot. The ball of your foot is on the walking surface, right under your toes. ? Hold the ends of the band or tube in your hands. ? The band or tube should be slightly tense when your foot is relaxed. 3. Slowly point your foot and toes downward to tilt the top of your foot away from your shin (plantar flexion). 4. Hold this position for __________  seconds. 5. Slowly return your foot to the starting position. Repeat __________ times. Complete this exercise __________ times a day. Ankle eversion 1. Sit on the floor with your legs straight out in front of you. 2. Loop a rubber exercise band or tube around the ball of your left / right foot. The ball of your foot is on the walking surface, right under your toes. ? Hold the ends of the band in your hands, or secure the band to a stable object. ? The band or tube should be slightly tense when your foot is relaxed. 3. Slowly push your foot outward, away from your other leg (eversion). 4. Hold this position for __________ seconds. 5. Slowly return your foot to the starting position. Repeat __________ times. Complete this exercise __________ times a day. This information is not intended to replace advice given to you by your health care provider. Make sure you discuss any questions you have with your health care provider. Document Released: 02/19/2005 Document Revised: 11/09/2018 Document Reviewed: 05/03/2018 Elsevier Patient Education  2020 [employer].

## 2019-02-24 NOTE — Telephone Encounter (Signed)
Pt was seen earlier today with Dr Diona Browner.

## 2019-03-30 DIAGNOSIS — X32XXXA Exposure to sunlight, initial encounter: Secondary | ICD-10-CM | POA: Diagnosis not present

## 2019-03-30 DIAGNOSIS — L538 Other specified erythematous conditions: Secondary | ICD-10-CM | POA: Diagnosis not present

## 2019-03-30 DIAGNOSIS — L565 Disseminated superficial actinic porokeratosis (DSAP): Secondary | ICD-10-CM | POA: Diagnosis not present

## 2019-04-01 ENCOUNTER — Other Ambulatory Visit: Payer: Self-pay | Admitting: Family Medicine

## 2019-04-05 ENCOUNTER — Ambulatory Visit (INDEPENDENT_AMBULATORY_CARE_PROVIDER_SITE_OTHER): Payer: PPO

## 2019-04-05 DIAGNOSIS — Z23 Encounter for immunization: Secondary | ICD-10-CM

## 2019-04-07 ENCOUNTER — Telehealth: Payer: Self-pay | Admitting: Family Medicine

## 2019-04-07 DIAGNOSIS — Z1211 Encounter for screening for malignant neoplasm of colon: Secondary | ICD-10-CM

## 2019-04-07 NOTE — Telephone Encounter (Signed)
Patient received an automated call letting her know that she's due for a Colonoscopy.  Patient had her last Colonoscopy done at [place].  Patient said she'd prefer to go to [facility] this time. Patient said if she has to go to [facility] she will, but she prefers [place] because it's closer to her home.

## 2019-04-07 NOTE — Telephone Encounter (Signed)
done

## 2019-04-12 ENCOUNTER — Telehealth: Payer: Self-pay

## 2019-04-12 ENCOUNTER — Telehealth: Payer: Self-pay | Admitting: Gastroenterology

## 2019-04-12 ENCOUNTER — Other Ambulatory Visit: Payer: Self-pay

## 2019-04-12 DIAGNOSIS — Z1211 Encounter for screening for malignant neoplasm of colon: Secondary | ICD-10-CM

## 2019-04-12 MED ORDER — NA SULFATE-K SULFATE-MG SULF 17.5-3.13-1.6 GM/177ML PO SOLN: 1.0000 | Freq: Once | ORAL | 0 refills | Status: AC

## 2019-04-12 NOTE — Telephone Encounter (Signed)
Gastroenterology Pre-Procedure Review  Request Date: 04/26/19 Requesting Physician: Dr. Vicente Males  PATIENT REVIEW QUESTIONS: The patient responded to the following health history questions as indicated:    1. Are you having any GI issues? no 2. Do you have a personal history of Polyps? no 3. Do you have a family history of Colon Cancer or Polyps? no 4. Diabetes Mellitus? no 5. Joint replacements in the past 12 months?no 6. Major health problems in the past 3 months?yes (sprain ankle in the past) 7. Any artificial heart valves, MVP, or defibrillator?no    MEDICATIONS & ALLERGIES:    Patient reports the following regarding taking any anticoagulation/antiplatelet therapy:   Plavix, Coumadin, Eliquis, Xarelto, Lovenox, Pradaxa, Brilinta, or Effient? no Aspirin? yes (81 mg daily)  Patient confirms/reports the following medications:  Current Outpatient Medications  Medication Sig Dispense Refill  . albuterol (PROVENTIL HFA;VENTOLIN HFA) 108 (90 Base) MCG/ACT inhaler Inhale 2 puffs into the lungs every 6 (six) hours as needed for wheezing or shortness of breath. 1 Inhaler 0  . amLODipine (NORVASC) 5 MG tablet Take 1 tablet by mouth once daily 90 tablet 0  . aspirin 81 MG tablet Take 81 mg by mouth daily.    Marland Kitchen atorvastatin (LIPITOR) 10 MG tablet Take 1 tablet (10 mg total) by mouth daily. (Patient not taking: Reported on 12/29/2018) 30 tablet 11  . cetirizine (ZYRTEC) 10 MG tablet Take 10 mg daily by mouth.    . cholecalciferol (VITAMIN D) 1000 UNITS tablet Take 1,000 Units by mouth daily.    Marland Kitchen esomeprazole (NEXIUM) 20 MG capsule Take 20 mg by mouth daily at 12 noon.    . finasteride (PROPECIA) 1 MG tablet TAKE ONE TABLET BY MOUTH EVERY DAY 90 tablet 3  . hydrochlorothiazide (HYDRODIURIL) 25 MG tablet Take 1 tablet by mouth once daily 90 tablet 0  . ibuprofen (ADVIL) 800 MG tablet Take 1 tablet (800 mg total) by mouth every 8 (eight) hours as needed. 30 tablet 0  . Multiple Vitamin (MULTIVITAMIN)  tablet Take 1 tablet by mouth daily.      Marland Kitchen VITAMIN E PO Take 1 tablet daily by mouth.     Current Facility-Administered Medications  Medication Dose Route Frequency Provider Last Rate Last Dose  . albuterol (PROVENTIL) (2.5 MG/3ML) 0.083% nebulizer solution 2.5 mg  2.5 mg Nebulization Once Burnard Hawthorne, FNP        Patient confirms/reports the following allergies:  Allergies  Allergen Reactions  . Codeine     REACTION: Nausea and vomiting  . Ace Inhibitors Cough  . Estradiol Rash    REACTION: rash at site of administration, topical rash only    No orders of the defined types were placed in this encounter.   AUTHORIZATION INFORMATION Primary Insurance: 1D#: Group #:  Secondary Insurance: 1D#: Group #:  SCHEDULE INFORMATION: Date: 04/26/19 Time: Location:[facility]

## 2019-04-12 NOTE — Telephone Encounter (Signed)
Lynd left vm to change pt rx Suprep to something cheaper please call [phone]

## 2019-04-14 ENCOUNTER — Other Ambulatory Visit: Payer: Self-pay

## 2019-04-14 DIAGNOSIS — Z1211 Encounter for screening for malignant neoplasm of colon: Secondary | ICD-10-CM

## 2019-04-14 MED ORDER — PEG 3350-KCL-NA BICARB-NACL 420 G PO SOLR: 4000.0000 mL | Freq: Once | ORAL | 0 refills | Status: AC

## 2019-04-22 ENCOUNTER — Other Ambulatory Visit: Payer: Self-pay

## 2019-04-22 ENCOUNTER — Other Ambulatory Visit
Admission: RE | Admit: 2019-04-22 | Discharge: 2019-04-22 | Disposition: A | Discharge status: Home / Self Care | Payer: PPO | Source: Ambulatory Visit | Attending: Gastroenterology | Admitting: Gastroenterology

## 2019-04-22 DIAGNOSIS — Z20828 Contact with and (suspected) exposure to other viral communicable diseases: Secondary | ICD-10-CM | POA: Diagnosis not present

## 2019-04-22 DIAGNOSIS — Z01812 Encounter for preprocedural laboratory examination: Secondary | ICD-10-CM | POA: Diagnosis not present

## 2019-04-22 LAB — SARS CORONAVIRUS 2 (TAT 6-24 HRS): SARS Coronavirus 2: NEGATIVE

## 2019-04-25 ENCOUNTER — Telehealth: Payer: Self-pay | Admitting: Gastroenterology

## 2019-04-25 NOTE — Telephone Encounter (Signed)
LVM returning patients call.  Asked her to please give me a call back in regards to questions pertaining to her Colonoscopy and COVID Test Results.  Note: COVID Test is Negative.

## 2019-04-25 NOTE — Telephone Encounter (Signed)
Pt left vm she has been trying to contact Sharyn Lull  unsuccessfully she has a procedure tomorrow and needs to know the results of her Covid19 test please call pt ASAP

## 2019-04-26 ENCOUNTER — Ambulatory Visit: Payer: PPO | Admitting: Anesthesiology

## 2019-04-26 ENCOUNTER — Ambulatory Visit
Admission: RE | Admit: 2019-04-26 | Discharge: 2019-04-26 | Disposition: A | Discharge status: Home / Self Care | Payer: PPO | Attending: Gastroenterology | Admitting: Gastroenterology

## 2019-04-26 ENCOUNTER — Encounter: Payer: Self-pay | Admitting: *Deleted

## 2019-04-26 ENCOUNTER — Encounter
Admission: RE | Disposition: A | Discharge status: Home / Self Care | Payer: Self-pay | Source: Home / Self Care | Attending: Gastroenterology

## 2019-04-26 DIAGNOSIS — E119 Type 2 diabetes mellitus without complications: Secondary | ICD-10-CM | POA: Diagnosis not present

## 2019-04-26 DIAGNOSIS — K5790 Diverticulosis of intestine, part unspecified, without perforation or abscess without bleeding: Secondary | ICD-10-CM | POA: Diagnosis not present

## 2019-04-26 DIAGNOSIS — Z6831 Body mass index (BMI) 31.0-31.9, adult: Secondary | ICD-10-CM | POA: Insufficient documentation

## 2019-04-26 DIAGNOSIS — Z79899 Other long term (current) drug therapy: Secondary | ICD-10-CM | POA: Insufficient documentation

## 2019-04-26 DIAGNOSIS — K219 Gastro-esophageal reflux disease without esophagitis: Secondary | ICD-10-CM | POA: Insufficient documentation

## 2019-04-26 DIAGNOSIS — J45909 Unspecified asthma, uncomplicated: Secondary | ICD-10-CM | POA: Insufficient documentation

## 2019-04-26 DIAGNOSIS — Z87442 Personal history of urinary calculi: Secondary | ICD-10-CM | POA: Diagnosis not present

## 2019-04-26 DIAGNOSIS — Z1211 Encounter for screening for malignant neoplasm of colon: Secondary | ICD-10-CM | POA: Diagnosis not present

## 2019-04-26 DIAGNOSIS — I10 Essential (primary) hypertension: Secondary | ICD-10-CM | POA: Insufficient documentation

## 2019-04-26 DIAGNOSIS — Z7982 Long term (current) use of aspirin: Secondary | ICD-10-CM | POA: Diagnosis not present

## 2019-04-26 DIAGNOSIS — E785 Hyperlipidemia, unspecified: Secondary | ICD-10-CM | POA: Diagnosis not present

## 2019-04-26 DIAGNOSIS — E669 Obesity, unspecified: Secondary | ICD-10-CM | POA: Insufficient documentation

## 2019-04-26 HISTORY — DX: Personal history of urinary calculi: Z87.442

## 2019-04-26 HISTORY — PX: COLONOSCOPY WITH PROPOFOL: SHX5780

## 2019-04-26 SURGERY — COLONOSCOPY WITH PROPOFOL
Anesthesia: General

## 2019-04-26 MED ORDER — PROPOFOL 10 MG/ML IV BOLUS
INTRAVENOUS | Status: DC | PRN
  Administered 2019-04-26: 60 mg via INTRAVENOUS
  Administered 2019-04-26: 40 mg via INTRAVENOUS

## 2019-04-26 MED ORDER — PROPOFOL 500 MG/50ML IV EMUL
INTRAVENOUS | Status: DC | PRN
  Administered 2019-04-26: 175 ug/kg/min via INTRAVENOUS

## 2019-04-26 MED ORDER — LIDOCAINE HCL (CARDIAC) PF 100 MG/5ML IV SOSY
PREFILLED_SYRINGE | INTRAVENOUS | Status: DC | PRN
  Administered 2019-04-26: 50 mg via INTRAVENOUS

## 2019-04-26 MED ORDER — SODIUM CHLORIDE 0.9 % IV SOLN
INTRAVENOUS | Status: DC
  Administered 2019-04-26: 1000 mL via INTRAVENOUS

## 2019-04-26 MED ORDER — PROPOFOL 500 MG/50ML IV EMUL
INTRAVENOUS | Status: AC
  Filled 2019-04-26: qty 50

## 2019-04-26 NOTE — Anesthesia Procedure Notes (Signed)
Date/Time: 04/26/2019 10:37 AM Performed by: Johnna Acosta, CRNA Pre-anesthesia Checklist: Patient identified, Emergency Drugs available, Suction available, Patient being monitored and Timeout performed Patient Re-evaluated:Patient Re-evaluated prior to induction Oxygen Delivery Method: Nasal cannula Preoxygenation: Pre-oxygenation with 100% oxygen Induction Type: IV induction

## 2019-04-26 NOTE — Anesthesia Postprocedure Evaluation (Signed)
Anesthesia Post Note  Patient: Dana Hayes  Procedure(s) Performed: COLONOSCOPY WITH PROPOFOL (N/A )  Patient location during evaluation: Endoscopy Anesthesia Type: General Level of consciousness: awake and alert and oriented Pain management: pain level controlled Vital Signs Assessment: post-procedure vital signs reviewed and stable Respiratory status: spontaneous breathing, nonlabored ventilation and respiratory function stable Cardiovascular status: blood pressure returned to baseline and stable Postop Assessment: no signs of nausea or vomiting Anesthetic complications: no     Last Vitals:  Vitals:   04/26/19 1109 04/26/19 1119  BP: 125/77 (!) 145/84  Pulse: 82 78  Resp: 20 (!) 21  Temp: (!) 36.4 C   SpO2: 96% 96%    Last Pain:  Vitals:   04/26/19 1119  TempSrc:   PainSc: 0-No pain                 Bessy Reaney

## 2019-04-26 NOTE — Anesthesia Preprocedure Evaluation (Signed)
Anesthesia Evaluation  Patient identified by MRN, date of birth, ID band Patient awake    Reviewed: Allergy & Precautions, NPO status , Patient's Chart, lab work & pertinent test results  History of Anesthesia Complications Negative for: history of anesthetic complications  Airway Mallampati: II  TM Distance: >3 FB Neck ROM: Full    Dental no notable dental hx.    Pulmonary asthma , neg sleep apnea, neg COPD,    breath sounds clear to auscultation- rhonchi (-) wheezing      Cardiovascular hypertension, Pt. on medications (-) CAD, (-) Past MI, (-) Cardiac Stents and (-) CABG  Rhythm:Regular Rate:Normal - Systolic murmurs and - Diastolic murmurs    Neuro/Psych neg Seizures negative neurological ROS  negative psych ROS   GI/Hepatic Neg liver ROS, GERD  ,  Endo/Other  diabetes  Renal/GU negative Renal ROS     Musculoskeletal negative musculoskeletal ROS (+)   Abdominal (+) + obese,   Peds  Hematology negative hematology ROS (+)   Anesthesia Other Findings Past Medical History: No date: Allergy No date: Asthma No date: GERD (gastroesophageal reflux disease) No date: History of kidney stones No date: Hyperlipidemia No date: Hypertension No date: Obesity   Reproductive/Obstetrics                             Anesthesia Physical Anesthesia Plan  ASA: II  Anesthesia Plan: General   Post-op Pain Management:    Induction: Intravenous  PONV Risk Score and Plan: 2 and Propofol infusion  Airway Management Planned: Natural Airway  Additional Equipment:   Intra-op Plan:   Post-operative Plan:   Informed Consent: I have reviewed the patients History and Physical, chart, labs and discussed the procedure including the risks, benefits and alternatives for the proposed anesthesia with the patient or authorized representative who has indicated his/her understanding and acceptance.      Dental advisory given  Plan Discussed with: CRNA and Anesthesiologist  Anesthesia Plan Comments:         Anesthesia Quick Evaluation

## 2019-04-26 NOTE — Transfer of Care (Signed)
Immediate Anesthesia Transfer of Care Note  Patient: Dana Hayes  Procedure(s) Performed: COLONOSCOPY WITH PROPOFOL (N/A )  Patient Location: PACU  Anesthesia Type:General  Level of Consciousness: awake, alert  and oriented  Airway & Oxygen Therapy: Patient Spontanous Breathing  Post-op Assessment: Report given to RN and Post -op Vital signs reviewed and stable  Post vital signs: Reviewed and stable  Last Vitals:  Vitals Value Taken Time  BP 125/77 04/26/19 1109  Temp 36.4 C 04/26/19 1109  Pulse 77 04/26/19 1110  Resp 23 04/26/19 1110  SpO2 97 % 04/26/19 1110  Vitals shown include unvalidated device data.  Last Pain:  Vitals:   04/26/19 1109  TempSrc: Tympanic  PainSc: 0-No pain         Complications: No apparent anesthesia complications

## 2019-04-26 NOTE — Anesthesia Post-op Follow-up Note (Signed)
Anesthesia QCDR form completed.        

## 2019-04-26 NOTE — H&P (Signed)
Jonathon Bellows, MD [address], [place], [place], [place], [id number], [place], [place], [place], [address] Phone: [phone]  Fax: [phone]  Primary Care Physician:  Owens Loffler, MD   Pre-Procedure History & Physical: HPI:  MADISSYN ROBERDS is a 73 y.o. female is here for an colonoscopy.   Past Medical History:  Diagnosis Date  . Allergy   . Asthma   . GERD (gastroesophageal reflux disease)   . History of kidney stones   . Hyperlipidemia   . Hypertension   . Obesity     Past Surgical History:  Procedure Laterality Date  . ABDOMINAL HYSTERECTOMY    . LITHOTRIPSY      TIMES 2 1990    Prior to Admission medications   Medication Sig Start Date End Date Taking? Authorizing Provider  amLODipine (NORVASC) 5 MG tablet Take 1 tablet by mouth once daily 04/01/19  Yes Copland, Spencer, MD  aspirin 81 MG tablet Take 81 mg by mouth daily.   Yes [provider]  cetirizine (ZYRTEC) 10 MG tablet Take 10 mg daily by mouth.   Yes [provider]  cholecalciferol (VITAMIN D) 1000 UNITS tablet Take 1,000 Units by mouth daily.   Yes [provider]  esomeprazole (NEXIUM) 20 MG capsule Take 20 mg by mouth daily at 12 noon.   Yes [provider]  finasteride (PROPECIA) 1 MG tablet TAKE ONE TABLET BY MOUTH EVERY DAY 07/21/18  Yes Copland, Frederico Hamman, MD  hydrochlorothiazide (HYDRODIURIL) 25 MG tablet Take 1 tablet by mouth once daily 04/01/19  Yes Copland, Frederico Hamman, MD  ibuprofen (ADVIL) 800 MG tablet Take 1 tablet (800 mg total) by mouth every 8 (eight) hours as needed. 02/24/19  Yes Bedsole, Amy E, MD  Multiple Vitamin (MULTIVITAMIN) tablet Take 1 tablet by mouth daily.     Yes [provider]  VITAMIN E PO Take 1 tablet daily by mouth.   Yes [provider]  albuterol (PROVENTIL HFA;VENTOLIN HFA) 108 (90 Base) MCG/ACT inhaler Inhale 2 puffs into the lungs every 6 (six) hours as needed for wheezing or shortness of  breath. 08/20/18   Pleas Koch, NP  atorvastatin (LIPITOR) 10 MG tablet Take 1 tablet (10 mg total) by mouth daily. Patient not taking: Reported on 12/29/2018 06/23/18   Jinny Sanders, MD    Allergies as of 04/12/2019 - Review Complete 02/24/2019  Allergen Reaction Noted  . Codeine  10/21/2007  . Ace inhibitors Cough 05/20/2017  . Estradiol Rash 08/06/2010    Family History  Problem Relation Age of Onset  . Coronary artery disease Mother   . Diabetes Mother   . Coronary artery disease Brother   . Heart attack Brother   . Breast cancer Maternal Aunt        early 23s    Social History   Socioeconomic History  . Marital status: Married    Spouse name: Not on file  . Number of children: Not on file  . Years of education: Not on file  . Highest education level: Not on file  Occupational History  . Occupation: Health visitor: [employer]  . Financial resource strain: Not on file  . Food insecurity    Worry: Not on file    Inability: Not on file  . Transportation needs    Medical: Not on file    Non-medical: Not on file  Tobacco Use  . Smoking status: Never Smoker  .  Smokeless tobacco: Never Used  Substance and Sexual Activity  . Alcohol use: Yes    Comment: occassionally a glass of wine or beer  . Drug use: No  . Sexual activity: Yes  Lifestyle  . Physical activity    Days per week: Not on file    Minutes per session: Not on file  . Stress: Not on file  Relationships  . Social Herbalist on phone: Not on file    Gets together: Not on file    Attends religious service: Not on file    Active member of club or organization: Not on file    Attends meetings of clubs or organizations: Not on file    Relationship status: Not on file  . Intimate partner violence    Fear of current or ex partner: Not on file    Emotionally abused: Not on file    Physically abused: Not on file    Forced sexual activity: Not on file  Other Topics  Concern  . Not on file  Social History Narrative   REGULAR EXERCISE: YES- WALKS 3-4-DAYS A WEEK      Diet: no fast food, instead veggies, loves baking    Review of Systems: See HPI, otherwise negative ROS  Physical Exam: BP (!) 160/94   Pulse 78   Temp (!) 96.8 F (36 C) (Tympanic)   Resp 18   Ht 5\' 1"  (1.549 m)   Wt 76.2 kg   SpO2 98%   BMI 31.74 kg/m  General:   Alert,  pleasant and cooperative in NAD Head:  Normocephalic and atraumatic. Neck:  Supple; no masses or thyromegaly. Lungs:  Clear throughout to auscultation, normal respiratory effort.    Heart:  +S1, +S2, Regular rate and rhythm, No edema. Abdomen:  Soft, nontender and nondistended. Normal bowel sounds, without guarding, and without rebound.   Neurologic:  Alert and  oriented x4;  grossly normal neurologically.  Impression/Plan: ALENAH LAPLUME is here for an colonoscopy to be performed for Screening colonoscopy average risk   Risks, benefits, limitations, and alternatives regarding  colonoscopy have been reviewed with the patient.  Questions have been answered.  All parties agreeable.   Jonathon Bellows, MD  04/26/2019, 10:29 AM

## 2019-04-26 NOTE — Op Note (Signed)
[facility] Gastroenterology Patient Name: Dana Hayes Procedure Date: 04/26/2019 10:25 AM MRN: [id number] Account #: [id number] Date of Birth: [birth year] Admit Type: Outpatient Age: 72 Room: [facility] ENDO ROOM 1 Gender: Female Note Status: Finalized Procedure:            Colonoscopy Indications:          Screening for colorectal malignant neoplasm Providers:            Jonathon Bellows MD, MD Medicines:            Monitored Anesthesia Care Complications:        No immediate complications. Procedure:            Pre-Anesthesia Assessment:                       - Prior to the procedure, a History and Physical was                        performed, and patient medications, allergies and                        sensitivities were reviewed. The patient's tolerance of                        previous anesthesia was reviewed.                       - The risks and benefits of the procedure and the                        sedation options and risks were discussed with the                        patient. All questions were answered and informed                        consent was obtained.                       - ASA Grade Assessment: II - A patient with mild                        systemic disease.                       After obtaining informed consent, the colonoscope was                        passed under direct vision. Throughout the procedure,                        the patient's blood pressure, pulse, and oxygen                        saturations were monitored continuously. The                        Colonoscope was introduced through the anus and                        advanced to the the cecum, identified by the  appendiceal orifice. The colonoscopy was performed with                        moderate difficulty due to significant looping.                        Successful completion of the procedure was aided by                        withdrawing the scope  and replacing with the pediatric                        colonoscope. The patient tolerated the procedure well.                        The quality of the bowel preparation was excellent. Findings:      The perianal and digital rectal examinations were normal.      The entire examined colon appeared normal on direct and retroflexion       views.      Multiple small and large-mouthed diverticula were found in the sigmoid       colon. Impression:           - The entire examined colon is normal on direct and                        retroflexion views.                       - No specimens collected. Recommendation:       - Discharge patient to home (with escort).                       - Resume previous diet.                       - Continue present medications.                       - no further colonoscopy for cancer screening ion view                        of age Procedure Code(s):    --- Professional ---                       [phone], Colonoscopy, flexible; diagnostic, including                        collection of specimen(s) by brushing or washing, when                        performed (separate procedure) Diagnosis Code(s):    --- Professional ---                       Z12.11, Encounter for screening for malignant neoplasm                        of colon CPT copyright 2019 American Medical Association. All rights reserved. The codes documented in this report are preliminary and upon coder review may  be revised to meet current compliance requirements. Jonathon Bellows, MD Bailey Mech  Vicente Males MD, MD 04/26/2019 11:05:45 AM This report has been signed electronically. Number of Addenda: 0 Note Initiated On: 04/26/2019 10:25 AM Scope Withdrawal Time: 0 hours 8 minutes 13 seconds  Total Procedure Duration: 0 hours 21 minutes 28 seconds  Estimated Blood Loss: Estimated blood loss: none.      [facility]

## 2019-04-27 ENCOUNTER — Encounter: Payer: Self-pay | Admitting: Gastroenterology

## 2019-05-09 ENCOUNTER — Other Ambulatory Visit: Payer: Self-pay | Admitting: Family Medicine

## 2019-05-09 DIAGNOSIS — Z1231 Encounter for screening mammogram for malignant neoplasm of breast: Secondary | ICD-10-CM

## 2019-06-02 ENCOUNTER — Other Ambulatory Visit: Payer: Self-pay | Admitting: Family Medicine

## 2019-06-02 NOTE — Telephone Encounter (Signed)
Last office visit for left ankle injury.  Last refilled 02/24/2019 for #30 with no refills.  CPE scheduled for 07/06/2019.

## 2019-06-13 ENCOUNTER — Telehealth: Payer: Self-pay | Admitting: *Deleted

## 2019-06-13 MED ORDER — ATORVASTATIN CALCIUM 10 MG PO TABS: 10.0000 mg | ORAL_TABLET | Freq: Every day | ORAL | 3 refills | Status: DC

## 2019-06-13 NOTE — Telephone Encounter (Signed)
Dana Hayes would like to restart a statin for her cholesterol.  She was on a statin in the past but stopped taking it.  She states she will be more compliant.  Please advise.

## 2019-06-17 ENCOUNTER — Ambulatory Visit
Admission: RE | Admit: 2019-06-17 | Discharge: 2019-06-17 | Disposition: A | Discharge status: Home / Self Care | Payer: PPO | Source: Ambulatory Visit | Attending: Family Medicine | Admitting: Family Medicine

## 2019-06-17 DIAGNOSIS — Z1231 Encounter for screening mammogram for malignant neoplasm of breast: Secondary | ICD-10-CM | POA: Insufficient documentation

## 2019-06-27 ENCOUNTER — Ambulatory Visit: Payer: PPO

## 2019-07-06 ENCOUNTER — Encounter: Payer: PPO | Admitting: Family Medicine

## 2019-07-13 ENCOUNTER — Other Ambulatory Visit: Payer: Self-pay | Admitting: Family Medicine

## 2019-07-13 DIAGNOSIS — E119 Type 2 diabetes mellitus without complications: Secondary | ICD-10-CM

## 2019-07-13 DIAGNOSIS — E782 Mixed hyperlipidemia: Secondary | ICD-10-CM

## 2019-07-13 DIAGNOSIS — Z79899 Other long term (current) drug therapy: Secondary | ICD-10-CM

## 2019-07-15 ENCOUNTER — Ambulatory Visit: Payer: PPO

## 2019-07-18 ENCOUNTER — Other Ambulatory Visit: Payer: Self-pay

## 2019-07-18 ENCOUNTER — Other Ambulatory Visit (INDEPENDENT_AMBULATORY_CARE_PROVIDER_SITE_OTHER): Payer: PPO

## 2019-07-18 DIAGNOSIS — E782 Mixed hyperlipidemia: Secondary | ICD-10-CM | POA: Diagnosis not present

## 2019-07-18 DIAGNOSIS — E119 Type 2 diabetes mellitus without complications: Secondary | ICD-10-CM | POA: Diagnosis not present

## 2019-07-18 DIAGNOSIS — Z79899 Other long term (current) drug therapy: Secondary | ICD-10-CM

## 2019-07-18 LAB — LIPID PANEL
Cholesterol: 173 mg/dL (ref 0–200)
HDL: 51 mg/dL (ref >39.00)
LDL Cholesterol: 103 mg/dL — ABNORMAL HIGH (ref 0–99)
NonHDL: 121.74
Total CHOL/HDL Ratio: 3
Triglycerides: 96 mg/dL (ref 0.0–149.0)
VLDL: 19.2 mg/dL (ref 0.0–40.0)

## 2019-07-18 LAB — HEPATIC FUNCTION PANEL
ALT: 27 U/L (ref 0–35)
AST: 19 U/L (ref 0–37)
Albumin: 4.3 g/dL (ref 3.5–5.2)
Alkaline Phosphatase: 64 U/L (ref 39–117)
Bilirubin, Direct: 0.1 mg/dL (ref 0.0–0.3)
Total Bilirubin: 0.6 mg/dL (ref 0.2–1.2)
Total Protein: 6.4 g/dL (ref 6.0–8.3)

## 2019-07-18 LAB — HEMOGLOBIN A1C: Hgb A1c MFr Bld: 7.2 % — ABNORMAL HIGH (ref 4.6–6.5)

## 2019-07-18 LAB — CBC WITH DIFFERENTIAL/PLATELET
Basophils Absolute: 0.1 10*3/uL (ref 0.0–0.1)
Basophils Relative: 0.8 % (ref 0.0–3.0)
Eosinophils Absolute: 0.2 10*3/uL (ref 0.0–0.7)
Eosinophils Relative: 2.3 % (ref 0.0–5.0)
HCT: 45.3 % (ref 36.0–46.0)
Hemoglobin: 15 g/dL (ref 12.0–15.0)
Lymphocytes Relative: 45.7 % (ref 12.0–46.0)
Lymphs Abs: 3.4 10*3/uL (ref 0.7–4.0)
MCHC: 33.1 g/dL (ref 30.0–36.0)
MCV: 82.9 fl (ref 78.0–100.0)
Monocytes Absolute: 0.7 10*3/uL (ref 0.1–1.0)
Monocytes Relative: 9 % (ref 3.0–12.0)
Neutro Abs: 3.1 10*3/uL (ref 1.4–7.7)
Neutrophils Relative %: 42.2 % — ABNORMAL LOW (ref 43.0–77.0)
Platelets: 320 10*3/uL (ref 150.0–400.0)
RBC: 5.47 Mil/uL — ABNORMAL HIGH (ref 3.87–5.11)
RDW: 13.4 % (ref 11.5–15.5)
WBC: 7.4 10*3/uL (ref 4.0–10.5)

## 2019-07-18 LAB — BASIC METABOLIC PANEL
BUN: 17 mg/dL (ref 6–23)
CO2: 26 mEq/L (ref 19–32)
Calcium: 9.6 mg/dL (ref 8.4–10.5)
Chloride: 102 mEq/L (ref 96–112)
Creatinine, Ser: 0.68 mg/dL (ref 0.40–1.20)
GFR: 85.15 mL/min (ref >60.00)
Glucose, Bld: 146 mg/dL — ABNORMAL HIGH (ref 70–99)
Potassium: 4 mEq/L (ref 3.5–5.1)
Sodium: 138 mEq/L (ref 135–145)

## 2019-07-18 LAB — MICROALBUMIN / CREATININE URINE RATIO
Creatinine,U: 99.3 mg/dL
Microalb Creat Ratio: 0.7 mg/g (ref 0.0–30.0)
Microalb, Ur: <0.7 mg/dL (ref 0.0–1.9)

## 2019-07-18 NOTE — Addendum Note (Signed)
Addended by: Ellamae Sia on: 07/18/2019 09:47 AM   Modules accepted: Orders

## 2019-07-19 ENCOUNTER — Other Ambulatory Visit: Payer: Self-pay

## 2019-07-19 ENCOUNTER — Ambulatory Visit (INDEPENDENT_AMBULATORY_CARE_PROVIDER_SITE_OTHER): Payer: PPO

## 2019-07-19 DIAGNOSIS — Z0000 Encounter for general adult medical examination without abnormal findings: Secondary | ICD-10-CM

## 2019-07-19 NOTE — Progress Notes (Signed)
Subjective:   Dana Hayes is a 73 y.o. female who presents for Medicare Annual (Subsequent) preventive examination.  Review of Systems: N/A   This visit is being conducted through telemedicine via telephone at the nurse health advisor's home address due to the COVID-19 pandemic. This patient has given me verbal consent via doximity to conduct this visit, patient states they are participating from their home address. Patient and myself are on the telephone call. There is no referral for this visit. Some vital signs may be absent or patient reported.    Patient identification: identified by name, DOB, and current address   Cardiac Risk Factors include: advanced age (>29men, >82 women);diabetes mellitus;dyslipidemia;hypertension     Objective:     Vitals: There were no vitals taken for this visit.  There is no height or weight on file to calculate BMI.  Advanced Directives 07/19/2019 04/26/2019 06/17/2018 06/15/2017 05/18/2017 05/09/2016  Does Patient Have a Medical Advance Directive? Yes Yes Yes Yes Yes Yes  Type of Paramedic of [place];Living will - [place];Living will - [place];Living will [place];Living will  Does patient want to make changes to medical advance directive? - - - - - No - Patient declined  Copy of [place] in Chart? No - copy requested - No - copy requested - No - copy requested No - copy requested    Tobacco Social History   Tobacco Use  Smoking Status Never Smoker  Smokeless Tobacco Never Used     Counseling given: Not Answered   Clinical Intake:  Pre-visit preparation completed: Yes  Pain : No/denies pain     Nutritional Risks: None Diabetes: Yes CBG done?: No Did pt. bring in CBG monitor from home?: No  How often do you need to have someone help you when you read instructions, pamphlets, or other written materials from your doctor or  pharmacy?: 1 - Never What is the last grade level you completed in school?: Associates  Interpreter Needed?: No  Information entered by :: CJohnson, LPN  Past Medical History:  Diagnosis Date  . Allergy   . Asthma   . GERD (gastroesophageal reflux disease)   . History of kidney stones   . Hyperlipidemia   . Hypertension   . Obesity    Past Surgical History:  Procedure Laterality Date  . ABDOMINAL HYSTERECTOMY    . COLONOSCOPY WITH PROPOFOL N/A 04/26/2019   Procedure: COLONOSCOPY WITH PROPOFOL;  Surgeon: Jonathon Bellows, MD;  Location: [facility] ENDOSCOPY;  Service: Gastroenterology;  Laterality: N/A;  . LITHOTRIPSY      TIMES 2 1990   Family History  Problem Relation Age of Onset  . Coronary artery disease Mother   . Diabetes Mother   . Coronary artery disease Brother   . Heart attack Brother   . Breast cancer Maternal Aunt        early 66s   Social History   Socioeconomic History  . Marital status: Married    Spouse name: Not on file  . Number of children: Not on file  . Years of education: Not on file  . Highest education level: Not on file  Occupational History  . Occupation: Health visitor: [employer]  Tobacco Use  . Smoking status: Never Smoker  . Smokeless tobacco: Never Used  Substance and Sexual Activity  . Alcohol use: Yes    Comment: occassionally a glass of wine or beer  .  Drug use: No  . Sexual activity: Yes  Other Topics Concern  . Not on file  Social History Narrative   REGULAR EXERCISE: YES- WALKS 3-4-DAYS A WEEK      Diet: no fast food, instead veggies, loves baking   Social Determinants of Health   Financial Resource Strain: Low Risk   . Difficulty of Paying Living Expenses: Not hard at all  Food Insecurity: No Food Insecurity  . Worried About Charity fundraiser in the Last Year: Never true  . Ran Out of Food in the Last Year: Never true  Transportation Needs: No Transportation Needs  . Lack of Transportation (Medical): No  . Lack  of Transportation (Non-Medical): No  Physical Activity: Inactive  . Days of Exercise per Week: 0 days  . Minutes of Exercise per Session: 0 min  Stress: No Stress Concern Present  . Feeling of Stress : Not at all  Social Connections:   . Frequency of Communication with Friends and Family: Not on file  . Frequency of Social Gatherings with Friends and Family: Not on file  . Attends Religious Services: Not on file  . Active Member of Clubs or Organizations: Not on file  . Attends Archivist Meetings: Not on file  . Marital Status: Not on file    Outpatient Encounter Medications as of 07/19/2019  Medication Sig  . albuterol (PROVENTIL HFA;VENTOLIN HFA) 108 (90 Base) MCG/ACT inhaler Inhale 2 puffs into the lungs every 6 (six) hours as needed for wheezing or shortness of breath.  Marland Kitchen amLODipine (NORVASC) 5 MG tablet Take 1 tablet by mouth once daily  . aspirin 81 MG tablet Take 81 mg by mouth daily.  Marland Kitchen atorvastatin (LIPITOR) 10 MG tablet Take 1 tablet (10 mg total) by mouth daily.  . cetirizine (ZYRTEC) 10 MG tablet Take 10 mg daily by mouth.  . cholecalciferol (VITAMIN D) 1000 UNITS tablet Take 1,000 Units by mouth daily.  Marland Kitchen esomeprazole (NEXIUM) 20 MG capsule Take 20 mg by mouth daily at 12 noon.  . finasteride (PROPECIA) 1 MG tablet TAKE ONE TABLET BY MOUTH EVERY DAY  . hydrochlorothiazide (HYDRODIURIL) 25 MG tablet Take 1 tablet by mouth once daily  . ibuprofen (ADVIL) 800 MG tablet TAKE 1 TABLET BY MOUTH EVERY 8 HOURS AS NEEDED  . Multiple Vitamin (MULTIVITAMIN) tablet Take 1 tablet by mouth daily.    Marland Kitchen VITAMIN E PO Take 1 tablet daily by mouth.   Facility-Administered Encounter Medications as of 07/19/2019  Medication  . albuterol (PROVENTIL) (2.5 MG/3ML) 0.083% nebulizer solution 2.5 mg    Activities of Daily Living In your present state of health, do you have any difficulty performing the following activities: 07/19/2019  Hearing? N  Vision? N  Difficulty  concentrating or making decisions? N  Walking or climbing stairs? N  Dressing or bathing? N  Doing errands, shopping? N  Preparing Food and eating ? N  Using the Toilet? N  In the past six months, have you accidently leaked urine? N  Do you have problems with loss of bowel control? N  Managing your Medications? N  Managing your Finances? N  Housekeeping or managing your Housekeeping? N  Some recent data might be hidden    Patient Care Team: Owens Loffler, MD as PCP - General Oneta Rack, MD as Consulting Physician (Dermatology)    Assessment:   This is a routine wellness examination for Taionna.  Exercise Activities and Dietary recommendations Current Exercise Habits: The patient does not  participate in regular exercise at present, Exercise limited by: None identified  Goals    . Increase physical activity     Starting 06/17/2018, I will continue to exercise at least 60 min 3 days per week and to walk 20 minutes daily.     . Patient Stated     07/19/2019, I will try to lose some weight and start back exercising.        Fall Risk Fall Risk  07/19/2019 06/17/2018 05/18/2017 07/15/2016 05/09/2016  Falls in the past year? 0 0 No No No  Number falls in past yr: 0 - - - -  Injury with Fall? 0 - - - -  Risk for fall due to : Medication side effect - - - -  Follow up Falls evaluation completed;Falls prevention discussed - - - -   Is the patient's home free of loose throw rugs in walkways, pet beds, electrical cords, etc?   yes      Grab bars in the bathroom? yes      Handrails on the stairs?   yes      Adequate lighting?   yes  Timed Get Up and Go performed: N/A  Depression Screen PHQ 2/9 Scores 07/19/2019 06/23/2018 06/17/2018 05/18/2017  PHQ - 2 Score 0 0 0 0  PHQ- 9 Score 0 - 0 0     Cognitive Function MMSE - Mini Mental State Exam 07/19/2019 06/17/2018 05/18/2017 05/09/2016  Orientation to time 5 5 5 5   Orientation to Place 5 5 5 5   Registration 3 3 3 3     Attention/ Calculation 5 0 0 0  Recall 3 3 3 3   Language- name 2 objects - 0 0 0  Language- repeat 1 1 1 1   Language- follow 3 step command - 3 3 3   Language- read & follow direction - 0 0 0  Write a sentence - 0 0 0  Copy design - 0 0 0  Total score - 20 20 20   Mini Cog  Mini-Cog screen was completed. Maximum score is 22. A value of 0 denotes this part of the MMSE was not completed or the patient failed this part of the Mini-Cog screening.       Immunization History  Administered Date(s) Administered  . Fluad Quad(high Dose 65+) 04/05/2019  . Influenza Split 06/09/2011  . Influenza Whole 06/04/2008, 06/12/2009, 06/05/2010  . Influenza, High Dose Seasonal PF 05/07/2015  . Influenza,inj,Quad PF,6+ Mos 07/05/2013, 05/10/2014, 05/09/2016, 05/18/2017, 05/20/2018  . Pneumococcal Conjugate-13 03/02/2014  . Pneumococcal Polysaccharide-23 05/09/2016  . Tdap 08/23/2012  . Zoster 07/11/2008    Qualifies for Shingles Vaccine? Yes  Screening Tests Health Maintenance  Topic Date Due  . FOOT EXAM  [birth year]/14/1955  . OPHTHALMOLOGY EXAM  [birth year]/14/1955  . HEMOGLOBIN A1C  01/16/2020  . URINE MICROALBUMIN  07/17/2020  . MAMMOGRAM  06/16/2021  . TETANUS/TDAP  08/23/2022  . COLONOSCOPY  04/25/2029  . INFLUENZA VACCINE  Completed  . DEXA SCAN  Completed  . Hepatitis C Screening  Completed  . PNA vac Low Risk Adult  Completed    Cancer Screenings: Lung: Low Dose CT Chest recommended if Age 101-80 years, 30 pack-year currently smoking OR have quit w/in 15years. Patient does not qualify. Breast:  Up to date on Mammogram? Yes, completed 06/17/2019   Up to date of Bone Density/Dexa? Yes, completed 06/24/2016 Colorectal: completed 04/26/2019  Additional Screenings:  Hepatitis C Screening: 05/09/2016     Plan:   Patient will work on losing  some weight and exercising.    I have personally reviewed and noted the following in the patient's chart:   . Medical and social history . Use of alcohol,  tobacco or illicit drugs  . Current medications and supplements . Functional ability and status . Nutritional status . Physical activity . Advanced directives . List of other physicians . Hospitalizations, surgeries, and ER visits in previous 12 months . Vitals . Screenings to include cognitive, depression, and falls . Referrals and appointments  In addition, I have reviewed and discussed with patient certain preventive protocols, quality metrics, and best practice recommendations. A written personalized care plan for preventive services as well as general preventive health recommendations were provided to patient.     Andrez Grime, LPN  [id number]

## 2019-07-19 NOTE — Progress Notes (Signed)
PCP notes:  Health Maintenance: Eye exam- over 1 year ago. Patient will schedule appointment.    Abnormal Screenings: none   Patient concerns: none   Nurse concerns: none   Next PCP appt.: 07/20/2019 @ 10:20 am

## 2019-07-19 NOTE — Patient Instructions (Signed)
Dana Hayes , Thank you for taking time to come for your Medicare Wellness Visit. I appreciate your ongoing commitment to your health goals. Please review the following plan we discussed and let me know if I can assist you in the future.   Screening recommendations/referrals: Colonoscopy: Up to date, completed 04/26/2019 Mammogram: Up to date, completed 06/17/2019 Bone Density: Up to date, completed 06/24/2016 Recommended yearly ophthalmology/optometry visit for glaucoma screening and checkup Recommended yearly dental visit for hygiene and checkup  Vaccinations: Influenza vaccine: Up to date, completed 04/05/2019 Pneumococcal vaccine: Completed series Tdap vaccine: Up to date, completed 08/23/2012 Shingles vaccine: discussed    Advanced directives: Please bring a copy of your POA (Power of [place]) and/or Living Will to your next appointment.   Conditions/risks identified: diabetes, hypertension, hyperlipidemia  Next appointment: 07/20/2019 @ 10:20 am    Preventive Care 71 Years and Older, Female Preventive care refers to lifestyle choices and visits with your health care provider that can promote health and wellness. What does preventive care include?  A yearly physical exam. This is also called an annual well check.  Dental exams once or twice a year.  Routine eye exams. Ask your health care provider how often you should have your eyes checked.  Personal lifestyle choices, including:  Daily care of your teeth and gums.  Regular physical activity.  Eating a healthy diet.  Avoiding tobacco and drug use.  Limiting alcohol use.  Practicing safe sex.  Taking low-dose aspirin every day.  Taking vitamin and mineral supplements as recommended by your health care provider. What happens during an annual well check? The services and screenings done by your health care provider during your annual well check will depend on your age, overall health, lifestyle risk factors, and  family history of disease. Counseling  Your health care provider may ask you questions about your:  Alcohol use.  Tobacco use.  Drug use.  Emotional well-being.  Home and relationship well-being.  Sexual activity.  Eating habits.  History of falls.  Memory and ability to understand (cognition).  Work and work Statistician.  Reproductive health. Screening  You may have the following tests or measurements:  Height, weight, and BMI.  Blood pressure.  Lipid and cholesterol levels. These may be checked every 5 years, or more frequently if you are over 64 years old.  Skin check.  Lung cancer screening. You may have this screening every year starting at age 18 if you have a 30-pack-year history of smoking and currently smoke or have quit within the past 15 years.  Fecal occult blood test (FOBT) of the stool. You may have this test every year starting at age 45.  Flexible sigmoidoscopy or colonoscopy. You may have a sigmoidoscopy every 5 years or a colonoscopy every 10 years starting at age 90.  Hepatitis C blood test.  Hepatitis B blood test.  Sexually transmitted disease (STD) testing.  Diabetes screening. This is done by checking your blood sugar (glucose) after you have not eaten for a while (fasting). You may have this done every 1-3 years.  Bone density scan. This is done to screen for osteoporosis. You may have this done starting at age 7.  Mammogram. This may be done every 1-2 years. Talk to your health care provider about how often you should have regular mammograms. Talk with your health care provider about your test results, treatment options, and if necessary, the need for more tests. Vaccines  Your health care provider may recommend certain vaccines, such  as:  Influenza vaccine. This is recommended every year.  Tetanus, diphtheria, and acellular pertussis (Tdap, Td) vaccine. You may need a Td booster every 10 years.  Zoster vaccine. You may need this  after age 12.  Pneumococcal 13-valent conjugate (PCV13) vaccine. One dose is recommended after age 79.  Pneumococcal polysaccharide (PPSV23) vaccine. One dose is recommended after age 63. Talk to your health care provider about which screenings and vaccines you need and how often you need them. This information is not intended to replace advice given to you by your health care provider. Make sure you discuss any questions you have with your health care provider. Document Released: 08/17/2015 Document Revised: 04/09/2016 Document Reviewed: 05/22/2015 Elsevier Interactive Patient Education  2017 [place] Prevention in the Home Falls can cause injuries. They can happen to people of all ages. There are many things you can do to make your home safe and to help prevent falls. What can I do on the outside of my home?  Regularly fix the edges of walkways and driveways and fix any cracks.  Remove anything that might make you trip as you walk through a door, such as a raised step or threshold.  Trim any bushes or trees on the path to your home.  Use bright outdoor lighting.  Clear any walking paths of anything that might make someone trip, such as rocks or tools.  Regularly check to see if handrails are loose or broken. Make sure that both sides of any steps have handrails.  Any raised decks and porches should have guardrails on the edges.  Have any leaves, snow, or ice cleared regularly.  Use sand or salt on walking paths during winter.  Clean up any spills in your garage right away. This includes oil or grease spills. What can I do in the bathroom?  Use night lights.  Install grab bars by the toilet and in the tub and shower. Do not use towel bars as grab bars.  Use non-skid mats or decals in the tub or shower.  If you need to sit down in the shower, use a plastic, non-slip stool.  Keep the floor dry. Clean up any water that spills on the floor as soon as it  happens.  Remove soap buildup in the tub or shower regularly.  Attach bath mats securely with double-sided non-slip rug tape.  Do not have throw rugs and other things on the floor that can make you trip. What can I do in the bedroom?  Use night lights.  Make sure that you have a light by your bed that is easy to reach.  Do not use any sheets or blankets that are too big for your bed. They should not hang down onto the floor.  Have a firm chair that has side arms. You can use this for support while you get dressed.  Do not have throw rugs and other things on the floor that can make you trip. What can I do in the kitchen?  Clean up any spills right away.  Avoid walking on wet floors.  Keep items that you use a lot in easy-to-reach places.  If you need to reach something above you, use a strong step stool that has a grab bar.  Keep electrical cords out of the way.  Do not use floor polish or wax that makes floors slippery. If you must use wax, use non-skid floor wax.  Do not have throw rugs and other things on the  floor that can make you trip. What can I do with my stairs?  Do not leave any items on the stairs.  Make sure that there are handrails on both sides of the stairs and use them. Fix handrails that are broken or loose. Make sure that handrails are as long as the stairways.  Check any carpeting to make sure that it is firmly attached to the stairs. Fix any carpet that is loose or worn.  Avoid having throw rugs at the top or bottom of the stairs. If you do have throw rugs, attach them to the floor with carpet tape.  Make sure that you have a light switch at the top of the stairs and the bottom of the stairs. If you do not have them, ask someone to add them for you. What else can I do to help prevent falls?  Wear shoes that:  Do not have high heels.  Have rubber bottoms.  Are comfortable and fit you well.  Are closed at the toe. Do not wear sandals.  If you  use a stepladder:  Make sure that it is fully opened. Do not climb a closed stepladder.  Make sure that both sides of the stepladder are locked into place.  Ask someone to hold it for you, if possible.  Clearly mark and make sure that you can see:  Any grab bars or handrails.  First and last steps.  Where the edge of each step is.  Use tools that help you move around (mobility aids) if they are needed. These include:  Canes.  Walkers.  Scooters.  Crutches.  Turn on the lights when you go into a dark area. Replace any light bulbs as soon as they burn out.  Set up your furniture so you have a clear path. Avoid moving your furniture around.  If any of your floors are uneven, fix them.  If there are any pets around you, be aware of where they are.  Review your medicines with your doctor. Some medicines can make you feel dizzy. This can increase your chance of falling. Ask your doctor what other things that you can do to help prevent falls. This information is not intended to replace advice given to you by your health care provider. Make sure you discuss any questions you have with your health care provider. Document Released: 05/17/2009 Document Revised: 12/27/2015 Document Reviewed: 08/25/2014 Elsevier Interactive Patient Education  2017 [employer].

## 2019-07-20 ENCOUNTER — Ambulatory Visit (INDEPENDENT_AMBULATORY_CARE_PROVIDER_SITE_OTHER): Payer: PPO | Admitting: Family Medicine

## 2019-07-20 ENCOUNTER — Encounter: Payer: Self-pay | Admitting: Family Medicine

## 2019-07-20 ENCOUNTER — Other Ambulatory Visit: Payer: Self-pay

## 2019-07-20 VITALS — BP 110/60 | HR 90 | Temp 98.0°F | Ht 60.75 in | Wt 175.5 lb

## 2019-07-20 DIAGNOSIS — E782 Mixed hyperlipidemia: Secondary | ICD-10-CM | POA: Diagnosis not present

## 2019-07-20 DIAGNOSIS — I10 Essential (primary) hypertension: Secondary | ICD-10-CM | POA: Diagnosis not present

## 2019-07-20 DIAGNOSIS — E119 Type 2 diabetes mellitus without complications: Secondary | ICD-10-CM | POA: Diagnosis not present

## 2019-07-20 NOTE — Patient Instructions (Signed)
Shingrix vaccine - two part series and can get from pharmacy - check with your insurance policy.

## 2019-07-20 NOTE — Progress Notes (Signed)
Truett Mcfarlan T. Ranay Ketter, MD Primary Care and [place] at [facility] [place], [address] Phone: [phone]  FAX: [place] - 73 y.o. female  MRN [id number]  Date of Birth: [birth year]  Visit Date: 07/20/2019  PCP: Owens Loffler, MD  Referred by: Owens Loffler, MD  Chief Complaint  Patient presents with  . Annual Exam    Part 2   This visit occurred during the SARS-CoV-2 public health emergency.  Safety protocols were in place, including screening questions prior to the visit, additional usage of staff PPE, and extensive cleaning of exam room while observing appropriate contact time as indicated for disinfecting solutions.   Subjective:   Dana Hayes is a 73 y.o. very pleasant female patient who presents with the following:  Health Maintenance  Topic Date Due  . FOOT EXAM  12/14/1955  . OPHTHALMOLOGY EXAM  [birth year]/14/1955  . HEMOGLOBIN A1C  01/16/2020  . URINE MICROALBUMIN  07/17/2020  . MAMMOGRAM  06/16/2021  . TETANUS/TDAP  08/23/2022  . COLONOSCOPY  04/25/2029  . INFLUENZA VACCINE  Completed  . DEXA SCAN  Completed  . Hepatitis C Screening  Completed  . PNA vac Low Risk Adult  Completed    10/2018 eye exam  Immunization History  Administered Date(s) Administered  . Fluad Quad(high Dose 65+) 04/05/2019  . Influenza Split 06/09/2011  . Influenza Whole 06/04/2008, 06/12/2009, 06/05/2010  . Influenza, High Dose Seasonal PF 05/07/2015  . Influenza,inj,Quad PF,6+ Mos 07/05/2013, 05/10/2014, 05/09/2016, 05/18/2017, 05/20/2018  . Pneumococcal Conjugate-13 03/02/2014  . Pneumococcal Polysaccharide-23 05/09/2016  . Tdap 08/23/2012  . Zoster 07/11/2008   S/p hyst   Shingrix?  Diabetes Mellitus: Tolerating Medications: yes Compliance with diet: fair, Body mass index is 33.43 kg/m. Exercise: minimal / intermittent Avg blood sugars at home: not checking Foot problems: none Hypoglycemia:  none No nausea, vomitting, blurred vision, polyuria.  Lab Results  Component Value Date   HGBA1C 7.2 (H) 07/18/2019   HGBA1C 6.5 (A) 06/23/2018   Lab Results  Component Value Date   MICROALBUR <0.7 07/18/2019   LDLCALC 103 (H) 07/18/2019   CREATININE 0.68 07/18/2019    Wt Readings from Last 3 Encounters:  07/20/19 175 lb 8 oz (79.6 kg)  04/26/19 168 lb (76.2 kg)  02/24/19 170 lb 8 oz (77.3 kg)    HTN: Tolerating all medications without side effects Stable and at goal No CP, no sob. No HA.  BP Readings from Last 3 Encounters:  07/20/19 110/60  04/26/19 (!) 145/84  02/24/19 (!) [id number]    Basic Metabolic Panel:    Component Value Date/Time   NA 138 07/18/2019 0925   K 4.0 07/18/2019 0925   CL 102 07/18/2019 0925   CO2 26 07/18/2019 0925   BUN 17 07/18/2019 0925   CREATININE 0.68 07/18/2019 0925   GLUCOSE 146 (H) 07/18/2019 0925   CALCIUM 9.6 07/18/2019 0925    Lipids: Doing well, stable. Tolerating meds fine with no SE. Panel reviewed with patient.  Lipids:    Component Value Date/Time   CHOL 173 07/18/2019 0925   TRIG 96.0 07/18/2019 0925   HDL 51.00 07/18/2019 0925   LDLDIRECT 162.6 05/29/2010 0829   VLDL 19.2 07/18/2019 0925   CHOLHDL 3 07/18/2019 0925    Lab Results  Component Value Date   ALT 27 07/18/2019   AST 19 07/18/2019   ALKPHOS 64 07/18/2019   BILITOT 0.6 07/18/2019     Past Medical  History, Surgical History, Social History, Family History, Problem List, Medications, and Allergies have been reviewed and updated if relevant.  Patient Active Problem List   Diagnosis Date Noted  . Osteopenia 02/24/2019  . Class 1 obesity with serious comorbidity and body mass index (BMI) of 32.0 to 32.9 in adult 06/23/2018  . Diabetes mellitus type 2, diet-controlled ([place]) 06/23/2018  . DIVERTICULOSIS OF COLON 12/28/2007  . Hyperlipidemia 10/21/2007  . Essential hypertension 10/21/2007  . ALLERGIC RHINITIS 10/21/2007  . Mild persistent asthma with  acute exacerbation in adult 10/21/2007  . GERD 10/21/2007  . NEPHROLITHIASIS, HX OF 10/21/2007    Past Medical History:  Diagnosis Date  . Allergy   . Asthma   . GERD (gastroesophageal reflux disease)   . History of kidney stones   . Hyperlipidemia   . Hypertension   . Obesity     Past Surgical History:  Procedure Laterality Date  . ABDOMINAL HYSTERECTOMY    . COLONOSCOPY WITH PROPOFOL N/A 04/26/2019   Procedure: COLONOSCOPY WITH PROPOFOL;  Surgeon: Jonathon Bellows, MD;  Location: [facility] ENDOSCOPY;  Service: Gastroenterology;  Laterality: N/A;  . LITHOTRIPSY      TIMES 2 1990    Social History   Socioeconomic History  . Marital status: Married    Spouse name: Not on file  . Number of children: Not on file  . Years of education: Not on file  . Highest education level: Not on file  Occupational History  . Occupation: Health visitor: [employer]  Tobacco Use  . Smoking status: Never Smoker  . Smokeless tobacco: Never Used  Substance and Sexual Activity  . Alcohol use: Yes    Comment: occassionally a glass of wine or beer  . Drug use: No  . Sexual activity: Yes  Other Topics Concern  . Not on file  Social History Narrative   REGULAR EXERCISE: YES- WALKS 3-4-DAYS A WEEK      Diet: no fast food, instead veggies, loves baking   Social Determinants of Health   Financial Resource Strain: Low Risk   . Difficulty of Paying Living Expenses: Not hard at all  Food Insecurity: No Food Insecurity  . Worried About Charity fundraiser in the Last Year: Never true  . Ran Out of Food in the Last Year: Never true  Transportation Needs: No Transportation Needs  . Lack of Transportation (Medical): No  . Lack of Transportation (Non-Medical): No  Physical Activity: Inactive  . Days of Exercise per Week: 0 days  . Minutes of Exercise per Session: 0 min  Stress: No Stress Concern Present  . Feeling of Stress : Not at all  Social Connections:   . Frequency of Communication  with Friends and Family: Not on file  . Frequency of Social Gatherings with Friends and Family: Not on file  . Attends Religious Services: Not on file  . Active Member of Clubs or Organizations: Not on file  . Attends Archivist Meetings: Not on file  . Marital Status: Not on file  Intimate Partner Violence: Not At Risk  . Fear of Current or Ex-Partner: No  . Emotionally Abused: No  . Physically Abused: No  . Sexually Abused: No    Family History  Problem Relation Age of Onset  . Coronary artery disease Mother   . Diabetes Mother   . Coronary artery disease Brother   . Heart attack Brother   . Breast cancer Maternal Aunt  early 18s    Allergies  Allergen Reactions  . Codeine     REACTION: Nausea and vomiting  . Ace Inhibitors Cough  . Estradiol Rash    REACTION: rash at site of administration, topical rash only    Medication list reviewed and updated in full in [place].   GEN: No acute illnesses, no fevers, chills. GI: No n/v/d, eating normally Pulm: No SOB Interactive and getting along well at home.  Otherwise, ROS is as per the HPI.  Objective:   BP 110/60   Pulse 90   Temp 98 F (36.7 C) (Temporal)   Ht 5' 0.75" (1.543 m)   Wt 175 lb 8 oz (79.6 kg)   SpO2 95%   BMI 33.43 kg/m   GEN: WDWN, NAD, Non-toxic, A & O x 3 HEENT: Atraumatic, Normocephalic. Neck supple. No masses, No LAD. Ears and Nose: No external deformity. CV: RRR, No M/G/R. No JVD. No thrill. No extra heart sounds. PULM: CTA B, no wheezes, crackles, rhonchi. No retractions. No resp. distress. No accessory muscle use. EXTR: No c/c/e NEURO Normal gait.  PSYCH: Normally interactive. Conversant. Not depressed or anxious appearing.  Calm demeanor.   Laboratory and Imaging Data: Results for orders placed or performed in visit on 07/18/19  Hemoglobin A1c  Result Value Ref Range   Hgb A1c MFr Bld 7.2 (H) 4.6 - 6.5 %  CBC with Differential/Platelet  Result Value Ref  Range   WBC 7.4 4.0 - 10.5 K/uL   RBC 5.47 (H) 3.87 - 5.11 Mil/uL   Hemoglobin 15.0 12.0 - 15.0 g/dL   HCT 45.3 36.0 - 46.0 %   MCV 82.9 78.0 - 100.0 fl   MCHC 33.1 30.0 - 36.0 g/dL   RDW 13.4 11.5 - 15.5 %   Platelets 320.0 150.0 - 400.0 K/uL   Neutrophils Relative % 42.2 (L) 43.0 - 77.0 %   Lymphocytes Relative 45.7 12.0 - 46.0 %   Monocytes Relative 9.0 3.0 - 12.0 %   Eosinophils Relative 2.3 0.0 - 5.0 %   Basophils Relative 0.8 0.0 - 3.0 %   Neutro Abs 3.1 1.4 - 7.7 K/uL   Lymphs Abs 3.4 0.7 - 4.0 K/uL   Monocytes Absolute 0.7 0.1 - 1.0 K/uL   Eosinophils Absolute 0.2 0.0 - 0.7 K/uL   Basophils Absolute 0.1 0.0 - 0.1 K/uL  Basic metabolic panel  Result Value Ref Range   Sodium 138 135 - 145 mEq/L   Potassium 4.0 3.5 - 5.1 mEq/L   Chloride 102 96 - 112 mEq/L   CO2 26 19 - 32 mEq/L   Glucose, Bld 146 (H) 70 - 99 mg/dL   BUN 17 6 - 23 mg/dL   Creatinine, Ser 0.68 0.40 - 1.20 mg/dL   GFR 85.15 >60.00 mL/min   Calcium 9.6 8.4 - 10.5 mg/dL  Liver Profile  Result Value Ref Range   Total Bilirubin 0.6 0.2 - 1.2 mg/dL   Bilirubin, Direct 0.1 0.0 - 0.3 mg/dL   Alkaline Phosphatase 64 39 - 117 U/L   AST 19 0 - 37 U/L   ALT 27 0 - 35 U/L   Total Protein 6.4 6.0 - 8.3 g/dL   Albumin 4.3 3.5 - 5.2 g/dL  Lipid Profile  Result Value Ref Range   Cholesterol 173 0 - 200 mg/dL   Triglycerides 96.0 0.0 - 149.0 mg/dL   HDL 51.00 >39.00 mg/dL   VLDL 19.2 0.0 - 40.0 mg/dL   LDL Cholesterol 103 (H)  0 - 99 mg/dL   Total CHOL/HDL Ratio 3    NonHDL 121.74   Urine Microalbumin w/creat. ratio  Result Value Ref Range   Microalb, Ur <0.7 0.0 - 1.9 mg/dL   Creatinine,U 99.3 mg/dL   Microalb Creat Ratio 0.7 0.0 - 30.0 mg/g     Assessment and Plan:     ICD-10-CM   1. Diabetes mellitus type 2, diet-controlled ([place])  E11.9   2. Essential hypertension  I10   3. Mixed hyperlipidemia  E78.2    Globally doing ok and stable. Work on [employer], eat well. F/u diet controlled DM  Follow-up:  Return in about 6 months (around 01/18/2020) for diabetes, a1c at the time of office visit.  No orders of the defined types were placed in this encounter.  No orders of the defined types were placed in this encounter.   Signed,  Maud Deed. Tezra Mahr, MD   Outpatient Encounter Medications as of 07/20/2019  Medication Sig  . albuterol (PROVENTIL HFA;VENTOLIN HFA) 108 (90 Base) MCG/ACT inhaler Inhale 2 puffs into the lungs every 6 (six) hours as needed for wheezing or shortness of breath.  Marland Kitchen amLODipine (NORVASC) 5 MG tablet Take 1 tablet by mouth once daily  . aspirin 81 MG tablet Take 81 mg by mouth daily.  Marland Kitchen atorvastatin (LIPITOR) 10 MG tablet Take 1 tablet (10 mg total) by mouth daily.  . cetirizine (ZYRTEC) 10 MG tablet Take 10 mg daily by mouth.  . cholecalciferol (VITAMIN D) 1000 UNITS tablet Take 1,000 Units by mouth daily.  Marland Kitchen esomeprazole (NEXIUM) 20 MG capsule Take 20 mg by mouth daily at 12 noon.  . finasteride (PROPECIA) 1 MG tablet TAKE ONE TABLET BY MOUTH EVERY DAY  . hydrochlorothiazide (HYDRODIURIL) 25 MG tablet Take 1 tablet by mouth once daily  . ibuprofen (ADVIL) 800 MG tablet TAKE 1 TABLET BY MOUTH EVERY 8 HOURS AS NEEDED  . Multiple Vitamin (MULTIVITAMIN) tablet Take 1 tablet by mouth daily.    . Probiotic Product (PROBIOTIC PO) Take 1 tablet by mouth daily.  Marland Kitchen VITAMIN E PO Take 1 tablet daily by mouth.   Facility-Administered Encounter Medications as of 07/20/2019  Medication  . albuterol (PROVENTIL) (2.5 MG/3ML) 0.083% nebulizer solution 2.5 mg

## 2019-08-08 ENCOUNTER — Other Ambulatory Visit: Payer: Self-pay | Admitting: Family Medicine

## 2020-01-05 ENCOUNTER — Other Ambulatory Visit: Payer: Self-pay | Admitting: Family Medicine

## 2020-01-05 NOTE — Telephone Encounter (Signed)
Last office visit 07/20/2019 for CPE.  Last refilled 06/02/2019 for #90 with 1 refill.  Next Appt: 01/18/2020 for DM.

## 2020-01-10 DIAGNOSIS — L565 Disseminated superficial actinic porokeratosis (DSAP): Secondary | ICD-10-CM | POA: Diagnosis not present

## 2020-01-10 DIAGNOSIS — X32XXXA Exposure to sunlight, initial encounter: Secondary | ICD-10-CM | POA: Diagnosis not present

## 2020-01-10 DIAGNOSIS — L570 Actinic keratosis: Secondary | ICD-10-CM | POA: Diagnosis not present

## 2020-01-18 ENCOUNTER — Telehealth: Payer: Self-pay | Admitting: Family Medicine

## 2020-01-18 ENCOUNTER — Ambulatory Visit: Payer: PPO | Admitting: Family Medicine

## 2020-01-18 NOTE — Chronic Care Management (AMB) (Signed)
°  Chronic Care Management   Note  01/18/2020 Name: Dana Hayes MRN: [id number] DOB: August 29, 1945  Dana Hayes is a 75 y.o. year old female who is a primary care patient of Copland, Frederico Hamman, MD. I reached out to Dana Hayes by phone today in response to a referral sent by Ms. Dana Hayes's PCP, Owens Loffler, MD.   Dana Hayes was given information about Chronic Care Management services today including:  1. CCM service includes personalized support from designated clinical staff supervised by her physician, including individualized plan of care and coordination with other care providers 2. 24/7 contact phone numbers for assistance for urgent and routine care needs. 3. Service will only be billed when office clinical staff spend 20 minutes or more in a month to coordinate care. 4. Only one practitioner may furnish and bill the service in a calendar month. 5. The patient may stop CCM services at any time (effective at the end of the month) by phone call to the office staff.   Patient agreed to services and verbal consent obtained.   Follow up plan:  [place]

## 2020-01-23 ENCOUNTER — Telehealth: Payer: Self-pay

## 2020-01-23 MED ORDER — AMLODIPINE BESYLATE 5 MG PO TABS: 5.0000 mg | ORAL_TABLET | Freq: Every day | ORAL | 1 refills | Status: DC

## 2020-01-23 MED ORDER — HYDROCHLOROTHIAZIDE 25 MG PO TABS: 25.0000 mg | ORAL_TABLET | Freq: Every day | ORAL | 1 refills | Status: DC

## 2020-01-23 NOTE — Telephone Encounter (Signed)
Refills sent

## 2020-01-23 NOTE — Telephone Encounter (Signed)
Dana Hayes requested to transfer her preferred pharmacy to [facility] for pharmacy coordination, adherence packaging, and delivery.   She will begin pill packaging 02/01/20 with amlodipine, atorvastatin, and HCTZ. Refills were transferred from [facility] and the following medication refills are still needed: HCTZ 25 mg, amlodipine 5 mg   Please send refills to [facility] in [place].  Thank you!  [name], PharmD Clinical Pharmacist [place] [facility] [phone]

## 2020-01-26 NOTE — Chronic Care Management (AMB) (Deleted)
Chronic Care Management Pharmacy  Name: Dana Hayes  MRN: [id number] DOB: 08-Mar-1946  Chief Complaint/ HPI  Dana Hayes,  74 y.o. , female presents for their {Initial/Follow-up:3041532} CCM visit with the clinical pharmacist {CHL [facility] visit HLKT:[id number]}.  PCP : Owens Loffler, MD  Their chronic conditions include: HTN, HLD, type 2 diabetes, GERD, osteopenia, mild persistent asthma  *** 6/16 consent  Office Visits: 07/20/19 OV - DM2, HTN, HLD - stable. Work on [employer]. F/u in 6 months.  Consult Visit: None  Medications: Outpatient Encounter Medications as of 01/30/2020  Medication Sig   albuterol (PROVENTIL HFA;VENTOLIN HFA) 108 (90 Base) MCG/ACT inhaler Inhale 2 puffs into the lungs every 6 (six) hours as needed for wheezing or shortness of breath.   amLODipine (NORVASC) 5 MG tablet Take 1 tablet (5 mg total) by mouth daily.   aspirin 81 MG tablet Take 81 mg by mouth daily.   atorvastatin (LIPITOR) 10 MG tablet Take 1 tablet (10 mg total) by mouth daily.   cetirizine (ZYRTEC) 10 MG tablet Take 10 mg daily by mouth.   cholecalciferol (VITAMIN D) 1000 UNITS tablet Take 1,000 Units by mouth daily.   esomeprazole (NEXIUM) 20 MG capsule Take 20 mg by mouth daily at 12 noon.   finasteride (PROPECIA) 1 MG tablet TAKE ONE TABLET BY MOUTH EVERY DAY   hydrochlorothiazide (HYDRODIURIL) 25 MG tablet Take 1 tablet (25 mg total) by mouth daily.   ibuprofen (ADVIL) 800 MG tablet TAKE 1 TABLET BY MOUTH EVERY 8 HOURS AS NEEDED   Multiple Vitamin (MULTIVITAMIN) tablet Take 1 tablet by mouth daily.     Probiotic Product (PROBIOTIC PO) Take 1 tablet by mouth daily.   VITAMIN E PO Take 1 tablet daily by mouth.   [DISCONTINUED] amLODipine (NORVASC) 5 MG tablet Take 1 tablet by mouth once daily   [DISCONTINUED] hydrochlorothiazide (HYDRODIURIL) 25 MG tablet Take 1 tablet by mouth once daily   Facility-Administered Encounter Medications as of 01/30/2020    Medication   albuterol (PROVENTIL) (2.5 MG/3ML) 0.083% nebulizer solution 2.5 mg     Current Diagnosis/Assessment:  Goals Addressed   None     Hypertension   BP today is:  {CHL [facility] Pharmacist BP ranges:[phone]}  Office blood pressures are  BP Readings from Last 3 Encounters:  07/20/19 110/60  04/26/19 (!) 145/84  02/24/19 (!) 144/100    Patient has failed these meds in the past: lisinopril/hctz Patient is currently {CHL Controlled/Uncontrolled:[phone]} on the following medications:   Amlodipine 5 mg 1 tablet daily  HCTZ 25 mg 1 tablet daily  Patient checks BP at home {CHL HP BP Monitoring Frequency:[phone]}  Patient home BP readings are ranging: ***  We discussed {[facility] discussion:[phone]}  Plan  Continue {CHL [facility] Plans:[phone]}     Hyperlipidemia   Lipid Panel     Component Value Date/Time   CHOL 173 07/18/2019 0925   TRIG 96.0 07/18/2019 0925   HDL 51.00 07/18/2019 0925   CHOLHDL 3 07/18/2019 0925   VLDL 19.2 07/18/2019 0925   LDLCALC 103 (H) 07/18/2019 0925   LDLDIRECT 162.6 05/29/2010 0829     The 10-year ASCVD risk score Mikey Bussing DC Jr., et al., 2013) is: 20.9%   Values used to calculate the score:     Age: 5 years     Sex: Female     Is Non-Hispanic African American: No     Diabetic: Yes     Tobacco smoker: No  Systolic Blood Pressure: 245 mmHg     Is BP treated: Yes     HDL Cholesterol: 51 mg/dL     Total Cholesterol: 173 mg/dL   Patient has failed these meds in past: simvastatin, rosuvastatin Patient is currently {CHL Controlled/Uncontrolled:[phone]} on the following medications:   Atorvastatin 10 mg 1 tablet daily  We discussed:  {[facility] discussion:[phone]}  Plan  Continue {[facility]L [facility] Plans:[phone]}   Diabetes   Recent Relevant Labs: Lab Results  Component Value Date/Time   HGBA1C 7.2 (H) 07/18/2019 09:25 AM   HGBA1C 6.5  (A) 06/23/2018 01:00 PM   GFR 85.15 07/18/2019 09:25 AM   GFR 86.37 06/17/2018 08:36 AM   MICROALBUR <0.7 07/18/2019 09:47 AM     Checking BG: {CHL HP Blood Glucose Monitoring Frequency:[phone]}  Recent FBG Readings: *** Recent pre-meal BG readings: *** Recent 2hr PP BG readings:  *** Recent HS BG readings: ***  Patient has failed these meds in past: none Patient is currently {CHL Controlled/Uncontrolled:[phone]} on the following medications:   *** diet controlled   Last diabetic Eye exam: No results found for: HMDIABEYEEXA  Last diabetic Foot exam: No results found for: HMDIABFOOTEX   We discussed: {[facility]L [facility] discussion:[phone]}  Plan  Continue {CHL [facility] Plans:[phone]}   Mild persistent asthma w/ acute exacerbation in adult   Patient has failed these meds in past: *** Patient is currently {CHL Controlled/Uncontrolled:[phone]} on the following medications:   Albuterol HFA 2 puffs into the lungs very 6 hrs as needed for wheezing or shortness of breath  We discussed:  ***  Plan  Continue {[facility]L [facility] Plans:[phone]}   Osteopenia   Last DEXA Scan: ***   T-Score femoral neck: ***  T-Score total hip: ***  T-Score lumbar spine: ***  T-Score forearm radius: ***  10-year probability of major osteoporotic fracture: ***  10-year probability of hip fracture: ***  VITD  Date Value Ref Range Status  06/17/2018 41.45 30.00 - 100.00 ng/mL Final     Patient {is;is not an osteoporosis candidate:23886}  Patient has failed these meds in past: *** Patient is currently {CHL Controlled/Uncontrolled:[phone]} on the following medications:   Vitamin D 1000 units 1 tablet daily  We discussed:  {Osteoporosis Counseling:23892}  Plan  Continue {[facility] Plans:[phone]}   GERD   Patient has failed these meds in past: none Patient is currently {CHL Controlled/Uncontrolled:[phone]} on the  following medications:   Esomeprazole 20 mg 1 capsule daily at noon  We discussed:  ***  Plan  Continue {[facility]L [facility] Plans:[phone]}    Alopecia ***    Patient has failed these meds in past: *** Patient is currently {CHL Controlled/Uncontrolled:[phone]} on the following medications:   Finasteride 1 mg 1 tablet daily  We discussed:  ***  Plan  Continue {[facility] YKDXI:[id number]}   OTCs/Health Maintenance   Patient is currently {CHL Controlled/Uncontrolled:[phone]} on the following medications:  Aspirin 81 mg 1 tablet daily  Cetirizine 10 mg 1 tablet daily  Vitamin E 1 tablet daily *** sig  Probiotic 1 tablet daily ***  Ibuprofen 800 mg 1 tablet every 8 hrs as needed  We discussed:  ***  Plan  Continue {[facility] QBHAL:[id number]}   Vaccines   Reviewed and discussed patient's vaccination history.    Immunization History  Administered Date(s) Administered   Fluad Quad(high Dose 65+) 04/05/2019   Influenza Split 06/09/2011   Influenza Whole 06/04/2008, 06/12/2009, 06/05/2010  Influenza, High Dose Seasonal PF 05/07/2015   Influenza,inj,Quad PF,6+ Mos 07/05/2013, 05/10/2014, 05/09/2016, 05/18/2017, 05/20/2018   Pneumococcal Conjugate-13 03/02/2014   Pneumococcal Polysaccharide-23 05/09/2016   Tdap 08/23/2012   Zoster 07/11/2008    Plan  Recommended patient receive *** vaccine in *** office.    Medication Management   Pharmacy/Benefits: Product/process development scientist / HTA Adherence:  Pt endorses ***% compliance  We discussed: ***  Plan  {US Pharmacy XVEZ:50158}   Follow up: *** month phone visit  Timer :  Yes CPP Chart prep 13:22:31 13:47:22 0:24:51

## 2020-01-30 ENCOUNTER — Ambulatory Visit: Payer: PPO

## 2020-01-30 ENCOUNTER — Other Ambulatory Visit: Payer: Self-pay

## 2020-01-30 ENCOUNTER — Telehealth: Payer: Self-pay

## 2020-01-30 DIAGNOSIS — E119 Type 2 diabetes mellitus without complications: Secondary | ICD-10-CM

## 2020-01-30 DIAGNOSIS — I10 Essential (primary) hypertension: Secondary | ICD-10-CM

## 2020-01-30 DIAGNOSIS — E782 Mixed hyperlipidemia: Secondary | ICD-10-CM

## 2020-01-30 DIAGNOSIS — E785 Hyperlipidemia, unspecified: Secondary | ICD-10-CM

## 2020-01-30 NOTE — Patient Instructions (Addendum)
Dear Dana Hayes,  It was a pleasure meeting you during our initial appointment on January 30, 2020. Below is a summary of the goals we discussed and components of chronic care management. Please contact me anytime with questions or concerns.   Visit Information  Goals Addressed            This Visit's Progress   . Chronic Care Management       CARE PLAN ENTRY  Current Barriers:  . Chronic Disease Management support, education, and care coordination needs related to Hypertension, Hyperlipidemia, Diabetes, GERD, and Osteoporosis   Hypertension BP Readings from Last 3 Encounters:  07/20/19 110/60  04/26/19 (!) 145/84  02/24/19 (!) 144/100 .  Pharmacist Clinical Goal(s): o Over the next 3 months, patient will work with PharmD and providers to maintain BP goal <140/90 mmHg . Current regimen:  . Amlodipine 5 mg -  1 tablet daily . HCTZ 25 mg - 1 tablet daily . Interventions: o Recommend low sodium diet and exercising 150 minutes per week . Patient self care activities - Over the next 3 months, patient will: o Check blood pressure at home for 5-7 days prior to next appointment, morning before breakfast and evening before supper o Ensure daily salt intake < 2300 mg/day o Take medications daily   Hyperlipidemia Lab Results  Component Value Date/Time   LDLCALC 103 (H) 07/18/2019 09:25 AM   LDLDIRECT 162.6 05/29/2010 08:29 AM .  Pharmacist Clinical Goal(s): o Over the next 3 months, patient will work with PharmD and providers to achieve LDL goal < 100 . Current regimen:  o Atorvastatin 10 mg - 1 tablet daily . Interventions: o Recommend continuing exercise and weight loss efforts . Patient self care activities - Over the next 3 months, patient will: . Achieve exercise goal of 30 minutes, 5 days per week . Incorporate a healthy diet high in vegetables, fruits and whole grains with low-fat dairy products, chicken, fish, legumes, non-tropical vegetable oils and nuts. Limit intake of  sweets, sugar-sweetened beverages and red meats.  Diabetes Lab Results  Component Value Date/Time   HGBA1C 7.2 (H) 07/18/2019 09:25 AM   HGBA1C 6.5 (A) 06/23/2018 01:00 PM .  Pharmacist Clinical Goal(s): o Over the next 3 months, patient will work with PharmD and providers to achieve A1c goal <7% . Current regimen:  o No pharmacotherapy . Interventions: o Recommend updating A1c in the next 2-4 weeks; past due for 6 month follow up with Dana Hayes  o Recommend diabetes plate method in addition to exercise and weight loss . Patient self care activities - Over the next 3 months, patient will: o Call office to schedule follow up with Dana Hayes and lab appointment at your soonest convenience  o Contact provider with any symptoms of hypoglycemia o Review the diabetes plate method and carbohydrate counting  Osteopenia . Pharmacist Clinical Goal(s) o Over the next 3 months, patient will work with PharmD and providers to improve bone density . Current regimen:  o Vitamin D 1000 units - 1 tablet daily . Interventions: o Recommend 1200 mg of calcium daily from dietary sources . Patient self care activities - Over the next 3 months, patient will: o Review calcium intake and add supplement if necessary  GERD . Pharmacist Clinical Goal(s) o Over the next 3 months, patient will work with PharmD and providers to reduce unnecessary medications . Current regimen:  o Esomeprazole 20 mg - 1 capsule daily (over the counter) . Interventions: o Recommend attempt  to reduce dose and monitor symptoms  Patient self care activities - Over the next 3 months, patient will: o Try reducing Nexium to every other day for 3-4 weeks, then switch to as needed Pepcid. If symptoms return > 2 days/week, you may need to resume daily Nexium.  Medication management . Pharmacist Clinical Goal(s): o Over the next 3 months, patient will work with PharmD and providers to achieve optimal medication adherence . Current  pharmacy: [place] . Interventions o Comprehensive medication review performed. o Utilize [facility] for medication synchronization, packaging and delivery . Patient self care activities: o Utilize pill packaging for daily medications o Contact Dana Hayes with any medication changes or concerns  Initial goal documentation       Dana Hayes was given information about Chronic Care Management services today including:  1. CCM service includes personalized support from designated clinical staff supervised by her physician, including individualized plan of care and coordination with other care providers 2. 24/7 contact phone numbers for assistance for urgent and routine care needs. 3. Standard insurance, coinsurance, copays and deductibles apply for chronic care management only during months in which we provide at least 20 minutes of these services. Most insurances cover these services at 100%, however patients may be responsible for any copay, coinsurance and/or deductible if applicable. This service may help you avoid the need for more expensive face-to-face services. 4. Only one practitioner may furnish and bill the service in a calendar month. 5. The patient may stop CCM services at any time (effective at the end of the month) by phone call to the office staff.  Patient agreed to services and verbal consent obtained.   Verbal consent obtained for [facility] enhanced pharmacy services (medication synchronization, adherence packaging, delivery coordination). A medication sync plan was created to allow patient to get all medications delivered once every 30 to 90 days per patient preference. Patient understands they have freedom to choose pharmacy and clinical pharmacist will coordinate care between all prescribers and [facility].  The patient verbalized understanding of instructions provided today and agreed to receive a mailed copy of patient instruction and/or  educational materials. Telephone follow up appointment with pharmacy team member scheduled for: May 01, 2020 at 9:00 AM (telephone call)  [name], PharmD Clinical Pharmacist [facility] [place]   Carbohydrate Counting for Diabetes Mellitus, Adult  Carbohydrate counting is a method of keeping track of how many carbohydrates you eat. Eating carbohydrates naturally increases the amount of sugar (glucose) in the blood. Counting how many carbohydrates you eat helps keep your blood glucose within normal limits, which helps you manage your diabetes (diabetes mellitus). It is important to know how many carbohydrates you can safely have in each meal. This is different for every person. A diet and nutrition specialist (registered dietitian) can help you make a meal plan and calculate how many carbohydrates you should have at each meal and snack. Carbohydrates are found in the following foods: Grains, such as breads and cereals. Dried beans and soy products. Starchy vegetables, such as potatoes, peas, and corn. Fruit and fruit juices. Milk and yogurt. Sweets and snack foods, such as cake, cookies, candy, chips, and soft drinks. How do I count carbohydrates? There are two ways to count carbohydrates in food. You can use either of the methods or a combination of both. Reading "Nutrition Facts" on packaged food The "Nutrition Facts" list is included on the labels of almost all packaged foods and beverages in the  [place] It includes: The serving size. Information about nutrients in each serving, including the grams (g) of carbohydrate per serving. To use the "Nutrition Facts": Decide how many servings you will have. Multiply the number of servings by the number of carbohydrates per serving. The resulting number is the total amount of carbohydrates that you will be having. Learning standard serving sizes of other foods When you eat carbohydrate foods that are  not packaged or do not include "Nutrition Facts" on the label, you need to measure the servings in order to count the amount of carbohydrates: Measure the foods that you will eat with a food scale or measuring cup, if needed. Decide how many standard-size servings you will eat. Multiply the number of servings by 15. Most carbohydrate-rich foods have about 15 g of carbohydrates per serving. For example, if you eat 8 oz (170 g) of strawberries, you will have eaten 2 servings and 30 g of carbohydrates (2 servings x 15 g = 30 g). For foods that have more than one food mixed, such as soups and casseroles, you must count the carbohydrates in each food that is included. The following list contains standard serving sizes of common carbohydrate-rich foods. Each of these servings has about 15 g of carbohydrates:  hamburger bun or  English muffin.  oz (15 mL) syrup.  oz (14 g) jelly. 1 slice of bread. 1 six-inch tortilla. 3 oz (85 g) cooked rice or pasta. 4 oz (113 g) cooked dried beans. 4 oz (113 g) starchy vegetable, such as peas, corn, or potatoes. 4 oz (113 g) hot cereal. 4 oz (113 g) mashed potatoes or  of a large baked potato. 4 oz (113 g) canned or frozen fruit. 4 oz (120 mL) fruit juice. 4-6 crackers. 6 chicken nuggets. 6 oz (170 g) unsweetened dry cereal. 6 oz (170 g) plain fat-free yogurt or yogurt sweetened with artificial sweeteners. 8 oz (240 mL) milk. 8 oz (170 g) fresh fruit or one small piece of fruit. 24 oz (680 g) popped popcorn. Example of carbohydrate counting Sample meal 3 oz (85 g) chicken breast. 6 oz (170 g) brown rice. 4 oz (113 g) corn. 8 oz (240 mL) milk. 8 oz (170 g) strawberries with sugar-free whipped topping. Carbohydrate calculation Identify the foods that contain carbohydrates: Rice. Corn. Milk. Strawberries. Calculate how many servings you have of each food: 2 servings rice. 1 serving corn. 1 serving milk. 1 serving strawberries. Multiply each  number of servings by 15 g: 2 servings rice x 15 g = 30 g. 1 serving corn x 15 g = 15 g. 1 serving milk x 15 g = 15 g. 1 serving strawberries x 15 g = 15 g. Add together all of the amounts to find the total grams of carbohydrates eaten: 30 g + 15 g + 15 g + 15 g = 75 g of carbohydrates total. Summary Carbohydrate counting is a method of keeping track of how many carbohydrates you eat. Eating carbohydrates naturally increases the amount of sugar (glucose) in the blood. Counting how many carbohydrates you eat helps keep your blood glucose within normal limits, which helps you manage your diabetes. A diet and nutrition specialist (registered dietitian) can help you make a meal plan and calculate how many carbohydrates you should have at each meal and snack. This information is not intended to replace advice given to you by your health care provider. Make sure you discuss any questions you have with your health care provider. Document Revised: 02/12/2017  Document Reviewed: 01/02/2016 Elsevier Patient Education  [employer].

## 2020-01-30 NOTE — Telephone Encounter (Signed)
Please sign referral order

## 2020-01-30 NOTE — Telephone Encounter (Signed)
I have collaborated with the care management provider regarding care management and care coordination activities outlined in this encounter and have reviewed this encounter including documentation in the note and care plan. I am certifying that I agree with the content of this note and encounter as supervising physician.  

## 2020-01-30 NOTE — Telephone Encounter (Signed)
Per written referral from PCP, requesting referral in Epic for [name] to chronic care management pharmacy services for the following conditions:   Essential hypertension, benign  [I10]  Hyperlipidemia [E78.5]  [name], PharmD Clinical Pharmacist [facility] [phone]

## 2020-01-30 NOTE — Chronic Care Management (AMB) (Signed)
Chronic Care Management Pharmacy  Name: Dana Hayes  MRN: [id number] DOB: 1945-11-23  Chief Complaint/ HPI  Dana Hayes,  74 y.o., female presents for their Initial CCM visit with the clinical pharmacist via telephone.  PCP : Owens Loffler, MD  Their chronic conditions include: HTN, HLD, type 2 diabetes, GERD, osteopenia, mild persistent asthma  Office Visits:  07/20/19 OV - DM2, HTN, HLD - stable. Work on [employer]. F/u in 6 months.  Consult Visit:   None in past 6 months  Allergies  Allergen Reactions  . Codeine     REACTION: Nausea and vomiting  . Ace Inhibitors Cough  . Estradiol Rash    REACTION: rash at site of administration, topical rash only   Medications: Outpatient Encounter Medications as of 01/30/2020  Medication Sig  . albuterol (PROVENTIL HFA;VENTOLIN HFA) 108 (90 Base) MCG/ACT inhaler Inhale 2 puffs into the lungs every 6 (six) hours as needed for wheezing or shortness of breath.  Marland Kitchen amLODipine (NORVASC) 5 MG tablet Take 1 tablet (5 mg total) by mouth daily.  Marland Kitchen aspirin 81 MG tablet Take 81 mg by mouth daily.  Marland Kitchen atorvastatin (LIPITOR) 10 MG tablet Take 1 tablet (10 mg total) by mouth daily.  . cetirizine (ZYRTEC) 10 MG tablet Take 10 mg daily by mouth.  . cholecalciferol (VITAMIN D) 1000 UNITS tablet Take 1,000 Units by mouth daily.  Marland Kitchen esomeprazole (NEXIUM) 20 MG capsule Take 20 mg by mouth daily at 12 noon.  . hydrochlorothiazide (HYDRODIURIL) 25 MG tablet Take 1 tablet (25 mg total) by mouth daily.  . finasteride (PROPECIA) 1 MG tablet TAKE ONE TABLET BY MOUTH EVERY DAY (Patient not taking: Reported on 01/30/2020)  . ibuprofen (ADVIL) 800 MG tablet TAKE 1 TABLET BY MOUTH EVERY 8 HOURS AS NEEDED (Patient not taking: Reported on 01/30/2020)  . Multiple Vitamin (MULTIVITAMIN) tablet Take 1 tablet by mouth daily.   (Patient not taking: Reported on 01/30/2020)  . Probiotic Product (PROBIOTIC PO) Take 1 tablet by mouth daily. (Patient not taking: Reported on  01/30/2020)  . VITAMIN E PO Take 1 tablet daily by mouth. (Patient not taking: Reported on 01/30/2020)  . [DISCONTINUED] amLODipine (NORVASC) 5 MG tablet Take 1 tablet by mouth once daily  . [DISCONTINUED] hydrochlorothiazide (HYDRODIURIL) 25 MG tablet Take 1 tablet by mouth once daily   Facility-Administered Encounter Medications as of 01/30/2020  Medication  . albuterol (PROVENTIL) (2.5 MG/3ML) 0.083% nebulizer solution 2.5 mg   Current Diagnosis/Assessment:   Financial Resource Strain: Low Risk   . Difficulty of Paying Living Expenses: Not hard at all   Goals Addressed            This Visit's Progress   . Chronic Care Management       CARE PLAN ENTRY  Current Barriers:  . Chronic Disease Management support, education, and care coordination needs related to Hypertension, Hyperlipidemia, Diabetes, GERD, and Osteoporosis   Hypertension BP Readings from Last 3 Encounters:  07/20/19 110/60  04/26/19 (!) 145/84  02/24/19 (!) 144/100 .  Pharmacist Clinical Goal(s): o Over the next 3 months, patient will work with PharmD and providers to maintain BP goal <140/90 mmHg . Current regimen:  . Amlodipine 5 mg -  1 tablet daily . HCTZ 25 mg - 1 tablet daily . Interventions: o Recommend low sodium diet and exercising 150 minutes per week . Patient self care activities - Over the next 3 months, patient will: o Check blood pressure at home for 5-7 days prior to  next appointment, morning before breakfast and evening before supper o Ensure daily salt intake < 2300 mg/day o Take medications daily   Hyperlipidemia Lab Results  Component Value Date/Time   LDLCALC 103 (H) 07/18/2019 09:25 AM   LDLDIRECT 162.6 05/29/2010 08:29 AM .  Pharmacist Clinical Goal(s): o Over the next 3 months, patient will work with PharmD and providers to achieve LDL goal < 100 . Current regimen:  o Atorvastatin 10 mg - 1 tablet daily . Interventions: o Recommend continuing exercise and weight loss  efforts . Patient self care activities - Over the next 3 months, patient will: . Achieve exercise goal of 30 minutes, 5 days per week . Incorporate a healthy diet high in vegetables, fruits and whole grains with low-fat dairy products, chicken, fish, legumes, non-tropical vegetable oils and nuts. Limit intake of sweets, sugar-sweetened beverages and red meats.  Diabetes Lab Results  Component Value Date/Time   HGBA1C 7.2 (H) 07/18/2019 09:25 AM   HGBA1C 6.5 (A) 06/23/2018 01:00 PM .  Pharmacist Clinical Goal(s): o Over the next 3 months, patient will work with PharmD and providers to achieve A1c goal <7% . Current regimen:  o No pharmacotherapy . Interventions: o Recommend updating A1c in the next 2-4 weeks; past due for 6 month follow up with Dr. Lorelei Pont  o Recommend diabetes plate method in addition to exercise and weight loss . Patient self care activities - Over the next 3 months, patient will: o Call office to schedule follow up with Dr. Lorelei Pont and lab appointment at your soonest convenience  o Contact provider with any symptoms of hypoglycemia o Review the diabetes plate method and carbohydrate counting  Osteopenia . Pharmacist Clinical Goal(s) o Over the next 3 months, patient will work with PharmD and providers to improve bone density . Current regimen:  o Vitamin D 1000 units - 1 tablet daily . Interventions: o Recommend 1200 mg of calcium daily from dietary sources . Patient self care activities - Over the next 3 months, patient will: o Review calcium intake and add supplement if necessary  GERD . Pharmacist Clinical Goal(s) o Over the next 3 months, patient will work with PharmD and providers to reduce unnecessary medications . Current regimen:  o Esomeprazole 20 mg - 1 capsule daily (over the counter) . Interventions: o Recommend attempt to reduce dose and monitor symptoms  Patient self care activities - Over the next 3 months, patient will: o Try reducing Nexium  to every other day for 3-4 weeks, then switch to as needed Pepcid. If symptoms return > 2 days/week, you may need to resume daily Nexium.  Medication management . Pharmacist Clinical Goal(s): o Over the next 3 months, patient will work with PharmD and providers to achieve optimal medication adherence . Current pharmacy: [facility] . Interventions o Comprehensive medication review performed. o Utilize [facility] for medication synchronization, packaging and delivery . Patient self care activities: o Utilize pill packaging for daily medications o Contact Kassady Laboy with any medication changes or concerns  Initial goal documentation      Hypertension   Office blood pressures are  BP Readings from Last 3 Encounters:  07/20/19 110/60  04/26/19 (!) 145/84  02/24/19 (!) 144/100   BP goal < 140/90 mmHg Patient has failed these meds in the past: lisinopril - cough  Patient is currently controlled on the following medications:  . Amlodipine 5 mg - 1 tablet daily . HCTZ 25 mg - 1 tablet daily  Patient checks BP at home:  none Adherence: > 5 day gap between refills   No recent med changes; 7/23 elevated BP, patient had ankle injury/pain, 9/22 elevated BP, pt was having procedure   Plan: Continue current medications; Recommend adherence packaging.    Hyperlipidemia/CV Risk   Lipid Panel     Component Value Date/Time   CHOL 173 07/18/2019 0925   TRIG 96.0 07/18/2019 0925   HDL 51.00 07/18/2019 0925   CHOLHDL 3 07/18/2019 0925   VLDL 19.2 07/18/2019 0925   LDLCALC 103 (H) 07/18/2019 0925   LDLDIRECT 162.6 05/29/2010 0829    CBC Latest Ref Rng & Units 07/18/2019 06/17/2018 06/15/2017  WBC 4.0 - 10.5 K/uL 7.4 7.2 10.1  Hemoglobin 12.0 - 15.0 g/dL 15.0 14.9 15.9  Hematocrit 36 - 46 % 45.3 44.7 47.4(H)  Platelets 150 - 400 K/uL 320.0 311.0 382   LDL goal < 100 Patient has tried these meds in past: simvastatin, rosuvastatin (unknown response) Patient is currently  uncontrolled on the following medications: . Atorvastatin 10 mg - 1 tablet daily . Aspirin 81 mg 1 tablet daily  Adherence: patient is > 30 days past due for refill CBC stable, aspirin for primary prevention in setting of diabetes   Plan: Continue current medications; Recommend adherence packaging.   Diabetes   Recent Relevant Labs: Lab Results  Component Value Date/Time   HGBA1C 7.2 (H) 07/18/2019 09:25 AM   HGBA1C 6.5 (A) 06/23/2018 01:00 PM   GFR 85.15 07/18/2019 09:25 AM   GFR 86.37 06/17/2018 08:36 AM   MICROALBUR <0.7 07/18/2019 09:47 AM    Checking BG: none  A1c goal < 7% Patient has failed these meds in past: none Patient is currently uncontrolled on the following medications:   No pharmacotherapy  We discussed: Progression from pre-diabetes to diabetes; consideration of metformin if lifestyle measures ineffective  Diet/exercise: reports weight loss of about 15 lbs since December; started back in the gym 3 days a week, walking dog too, cut back on portions   Plan: Continue control with diet and exercise; Recommend repeat A1c/scheduling office visit with PCP at [facility], then f/u with PharmD in 3 months.   Mild Persistent Asthma   Patient has failed these meds in past: none reported Patient is currently controlled on the following medications:  . Albuterol HFA 90 mcg - 2 puffs q6h PRN wheezing or shortness of breath . Cetirizine 10 mg - 1 tablet daily  We discussed: reports asthma is well controlled unless exposed to tobacco smoke or pollutant; has not had to use inhaler recently  Plan: Continue current medications  Osteopenia   2017 DEXA: osteopenia t-2.0 FRAX: 10-year Probability of Fracture - Major Osteoporotic Fracture 10.9% - Hip Fracture 1.8%  VITD  Date Value Ref Range Status  06/17/2018 41.45 30.00 - 100.00 ng/mL Final    Patient has failed these meds in past: none repoted Patient is currently controlled on the following medications:   Marland Kitchen Vitamin D 1000 units 1 tablet daily (OTC)  We discussed:  Recommend 1200 mg of calcium daily from dietary and supplemental sources.  Plan: Continue current medications  GERD   Patient has failed these meds in past: none Patient is currently controlled on the following medications:  . Esomeprazole 20 mg - 1 capsule daily (OTC)  We discussed: reports taking OTC for 2-3 years; reports history of choking at night with acid reflux, very bad indigestion/burning, has not had any symptoms recently and is interested in reducing dose.   Plan:Recommend every other day dosing for  2-4 weeks, then stop if no symptoms. Use Pepcid PRN.  Vaccines   Reviewed and discussed patient's vaccination history.    Immunization History  Administered Date(s) Administered  . Fluad Quad(high Dose 65+) 04/05/2019  . Influenza Split 06/09/2011  . Influenza Whole 06/04/2008, 06/12/2009, 06/05/2010  . Influenza, High Dose Seasonal PF 05/07/2015  . Influenza,inj,Quad PF,6+ Mos 07/05/2013, 05/10/2014, 05/09/2016, 05/18/2017, 05/20/2018  . Pneumococcal Conjugate-13 03/02/2014  . Pneumococcal Polysaccharide-23 05/09/2016  . Tdap 08/23/2012  . Zoster 07/11/2008   Plan: Recommended patient receive Shingrix   Medication Management   Pharmacy/Benefits: [facility] / [facility] Adherence: > 5 day gap between refills on atorvastatin, hctz, amlodipine  We discussed: adherence packaging  Verbal consent obtained for [facility] enhanced pharmacy services (medication synchronization, adherence packaging, delivery coordination). A medication sync plan was created to allow patient to get all medications delivered once every 30 to 90 days per patient preference. Patient understands they have freedom to choose pharmacy and clinical pharmacist will coordinate care between all prescribers and [facility].  Plan: Utilize [facility] for medication synchronization, packaging and delivery  Follow up: 3  months telephone visit  [name], PharmD Clinical Pharmacist [facility] [phone]

## 2020-02-21 ENCOUNTER — Ambulatory Visit: Payer: Self-pay

## 2020-02-21 NOTE — Chronic Care Management (AMB) (Signed)
Medication coordination call:  Reviewed chart for medication changes ahead of medication coordination call.   No office visits, consults, or hospital visits since last CCM visit on 01/30/20.   BP Readings from Last 3 Encounters:  07/20/19 110/60  04/26/19 (!) 145/84  02/24/19 (!) 144/100    Lab Results  Component Value Date   HGBA1C 7.2 (H) 07/18/2019    Past due for OV with PCP for DM check.  Patient obtains medications through Adherence Packaging  30 Days   Last adherence delivery included: 01/25/20, patient started packs on 02/01/20  HCTZ 25 mg -  1 at lunch    Amlodipine 5 mg - 1 at breakfast   Atorvastatin 10 mg - 1 at breakfast   Patient is due for next adherence delivery on: 02/24/20, patient will start new packs on 03/02/20 Called patient to review medications and coordinate delivery. Patient did not answer phone for medication review.   This delivery to include:  HCTZ 25 mg -  1 at lunch    Amlodipine 5 mg - 1 at breakfast   Atorvastatin 10 mg - 1 at breakfast   She purchases OTC medications elsewhere: esomeprazole, cetirizine, aspirin, vitamin D  Confirmed delivery date of 02/24/20, pharmacy will contact them the morning of delivery.  [name], PharmD Clinical Pharmacist [facility] [phone]

## 2020-03-21 ENCOUNTER — Telehealth: Payer: Self-pay

## 2020-03-21 NOTE — Progress Notes (Signed)
Chronic Care Management Pharmacy Assistant   Name: Dana Hayes  MRN: [id number] DOB: 1945-10-30  Reason for Encounter: Medication Review  Patient Questions:  1.  Have you seen any other providers since your last visit? No  2.  Any changes in your medicines or health? No     PCP : Owens Loffler, MD  Allergies:   Allergies  Allergen Reactions  . Codeine     REACTION: Nausea and vomiting  . Ace Inhibitors Cough  . Estradiol Rash    REACTION: rash at site of administration, topical rash only    Medications: Outpatient Encounter Medications as of 03/21/2020  Medication Sig  . albuterol (PROVENTIL HFA;VENTOLIN HFA) 108 (90 Base) MCG/ACT inhaler Inhale 2 puffs into the lungs every 6 (six) hours as needed for wheezing or shortness of breath.  Marland Kitchen amLODipine (NORVASC) 5 MG tablet Take 1 tablet (5 mg total) by mouth daily.  Marland Kitchen aspirin 81 MG tablet Take 81 mg by mouth daily.  Marland Kitchen atorvastatin (LIPITOR) 10 MG tablet Take 1 tablet (10 mg total) by mouth daily.  . cetirizine (ZYRTEC) 10 MG tablet Take 10 mg daily by mouth.  . cholecalciferol (VITAMIN D) 1000 UNITS tablet Take 1,000 Units by mouth daily.  Marland Kitchen esomeprazole (NEXIUM) 20 MG capsule Take 20 mg by mouth daily at 12 noon.  . finasteride (PROPECIA) 1 MG tablet TAKE ONE TABLET BY MOUTH EVERY DAY (Patient not taking: Reported on 01/30/2020)  . hydrochlorothiazide (HYDRODIURIL) 25 MG tablet Take 1 tablet (25 mg total) by mouth daily.  Marland Kitchen ibuprofen (ADVIL) 800 MG tablet TAKE 1 TABLET BY MOUTH EVERY 8 HOURS AS NEEDED (Patient not taking: Reported on 01/30/2020)  . Multiple Vitamin (MULTIVITAMIN) tablet Take 1 tablet by mouth daily.   (Patient not taking: Reported on 01/30/2020)  . Probiotic Product (PROBIOTIC PO) Take 1 tablet by mouth daily. (Patient not taking: Reported on 01/30/2020)  . VITAMIN E PO Take 1 tablet daily by mouth. (Patient not taking: Reported on 01/30/2020)   Facility-Administered Encounter Medications as of 03/21/2020    Medication  . albuterol (PROVENTIL) (2.5 MG/3ML) 0.083% nebulizer solution 2.5 mg    Current Diagnosis: Patient Active Problem List   Diagnosis Date Noted  . Osteopenia 02/24/2019  . Class 1 obesity with serious comorbidity and body mass index (BMI) of 32.0 to 32.9 in adult 06/23/2018  . Diabetes mellitus type 2, diet-controlled ([place]) 06/23/2018  . DIVERTICULOSIS OF COLON 12/28/2007  . Hyperlipidemia 10/21/2007  . Essential hypertension 10/21/2007  . ALLERGIC RHINITIS 10/21/2007  . Mild persistent asthma with acute exacerbation in adult 10/21/2007  . GERD 10/21/2007  . NEPHROLITHIASIS, HX OF 10/21/2007    Goals Addressed   None     Follow-Up:  Coordination of Enhanced Pharmacy Services  Reviewed chart for medication changes ahead of medication coordination call.  No OVs, Consults, or hospital visits since last care coordination call/Pharmacist visit.   No medication changes indicated.  BP Readings from Last 3 Encounters:  07/20/19 110/60  04/26/19 (!) 145/84  02/24/19 (!) 144/100    Lab Results  Component Value Date   HGBA1C 7.2 (H) 07/18/2019     Patient obtains medications through Adherence Packaging  30 Days   Last adherence delivery included:  . HCTZ 25 mg -  1 lunch  . Amlodipine 5 mg - 1 breakfast . Atorvastatin 10 mg - 1 breakfast  Patient declined the follow medications last month: Marland Kitchen Ibuprofen (PRN) Patient is due for next adherence delivery on: 03/28/2020.  Called patient and reviewed medications and coordinated delivery.  This delivery to include: . HCTZ 25 mg - 1 lunch  . Amlodipine 5 mg - 1 breakfast . Atorvastatin 10 mg - 1 breakfast   Patient declined the following medications: . Ibuprofen (PRN)- uses PRN will let us know when she needs again.  Patient does not need any refills at this time.  Confirmed delivery date of 03/28/2020, advised patient that pharmacy will contact them the morning of delivery.

## 2020-03-28 DIAGNOSIS — L658 Other specified nonscarring hair loss: Secondary | ICD-10-CM | POA: Diagnosis not present

## 2020-03-28 DIAGNOSIS — R208 Other disturbances of skin sensation: Secondary | ICD-10-CM | POA: Diagnosis not present

## 2020-03-28 DIAGNOSIS — X32XXXA Exposure to sunlight, initial encounter: Secondary | ICD-10-CM | POA: Diagnosis not present

## 2020-03-28 DIAGNOSIS — L538 Other specified erythematous conditions: Secondary | ICD-10-CM | POA: Diagnosis not present

## 2020-03-28 DIAGNOSIS — L820 Inflamed seborrheic keratosis: Secondary | ICD-10-CM | POA: Diagnosis not present

## 2020-03-28 DIAGNOSIS — L565 Disseminated superficial actinic porokeratosis (DSAP): Secondary | ICD-10-CM | POA: Diagnosis not present

## 2020-03-28 DIAGNOSIS — L821 Other seborrheic keratosis: Secondary | ICD-10-CM | POA: Diagnosis not present

## 2020-04-19 ENCOUNTER — Other Ambulatory Visit: Payer: Self-pay | Admitting: Family Medicine

## 2020-04-24 ENCOUNTER — Telehealth: Payer: Self-pay

## 2020-04-24 NOTE — Chronic Care Management (AMB) (Signed)
Chronic Care Management Pharmacy Assistant   Name: Dana Hayes  MRN: [id number] DOB: [birth year]  Reason for Encounter: Medication Review/ Monthly Medication Dispensing Call.  PCP : Owens Loffler, MD  Allergies:   Allergies  Allergen Reactions  . Codeine     REACTION: Nausea and vomiting  . Ace Inhibitors Cough  . Estradiol Rash    REACTION: rash at site of administration, topical rash only    Medications: Outpatient Encounter Medications as of 04/24/2020  Medication Sig  . albuterol (PROVENTIL HFA;VENTOLIN HFA) 108 (90 Base) MCG/ACT inhaler Inhale 2 puffs into the lungs every 6 (six) hours as needed for wheezing or shortness of breath.  Marland Kitchen amLODipine (NORVASC) 5 MG tablet Take 1 tablet (5 mg total) by mouth daily.  Marland Kitchen aspirin 81 MG tablet Take 81 mg by mouth daily.  Marland Kitchen atorvastatin (LIPITOR) 10 MG tablet Take 1 tablet (10 mg total) by mouth daily.  . cetirizine (ZYRTEC) 10 MG tablet Take 10 mg daily by mouth.  . cholecalciferol (VITAMIN D) 1000 UNITS tablet Take 1,000 Units by mouth daily.  Marland Kitchen esomeprazole (NEXIUM) 20 MG capsule Take 20 mg by mouth daily at 12 noon. (Patient not taking: Reported on 04/30/2020)  . finasteride (PROPECIA) 1 MG tablet TAKE 1 TABLET BY MOUTH EVERY DAY  . hydrochlorothiazide (HYDRODIURIL) 25 MG tablet Take 1 tablet (25 mg total) by mouth daily.  Marland Kitchen ibuprofen (ADVIL) 800 MG tablet TAKE 1 TABLET BY MOUTH EVERY 8 HOURS AS NEEDED (Patient not taking: Reported on 01/30/2020)  . Multiple Vitamin (MULTIVITAMIN) tablet Take 1 tablet by mouth daily.   (Patient not taking: Reported on 01/30/2020)  . Probiotic Product (PROBIOTIC PO) Take 1 tablet by mouth daily. (Patient not taking: Reported on 01/30/2020)  . VITAMIN E PO Take 1 tablet daily by mouth. (Patient not taking: Reported on 01/30/2020)   Facility-Administered Encounter Medications as of 04/24/2020  Medication  . albuterol (PROVENTIL) (2.5 MG/3ML) 0.083% nebulizer solution 2.5 mg    Current  Diagnosis: Patient Active Problem List   Diagnosis Date Noted  . Osteopenia 02/24/2019  . Class 1 obesity with serious comorbidity and body mass index (BMI) of 32.0 to 32.9 in adult 06/23/2018  . Diabetes mellitus type 2, diet-controlled ([place]) 06/23/2018  . DIVERTICULOSIS OF COLON 12/28/2007  . Hyperlipidemia 10/21/2007  . Essential hypertension 10/21/2007  . ALLERGIC RHINITIS 10/21/2007  . Mild persistent asthma with acute exacerbation in adult 10/21/2007  . GERD 10/21/2007  . NEPHROLITHIASIS, HX OF 10/21/2007    Goals Addressed   None    Reviewed chart for medication changes ahead of medication coordination call.  No OVs, Consults,medication changes, or hospital visits since last care coordination call/Pharmacist visit.    BP Readings from Last 3 Encounters:  07/20/19 110/60  04/26/19 (!) 145/84  02/24/19 (!) 144/100    Lab Results  Component Value Date   HGBA1C 7.2 (H) 07/18/2019     Patient obtains medications through Adherence Packaging  90 Days   Last adherence delivery included: HCTZ 25 mg - 1 tablet daily(breakfast), Amlodipine 5 mg - 1 tablet daily(breakfast), Atorvastatin 10 mg - 1 tablet daily(breakfast).  Patient declined Ibuprofen 800 mg- 1 tablet every 8 hours as needed last month due using PRN.   Patient is due for next adherence delivery on: 04/27/2020.  Called 04/24/20 & 04/25/20, Left message to return call.   05/02/2020- Called patient and reviewed medications and coordinated delivery.  This delivery to include: HCTZ 25 mg - 1 tablet daily(breakfast), Amlodipine  5 mg - 1 tablet daily(breakfast), Atorvastatin 10 mg - 1 tablet daily(breakfast),  Ibuprofen 800 mg- 1 tablet every 8 hours as needed.    Patient needs refills for: HCTZ 25 mg - 1 tablet daily(breakfast), Amlodipine 5 mg - 1 tablet daily(breakfast), Atorvastatin 10 mg - 1 tablet daily(breakfast),  Ibuprofen 800 mg- 1 tablet every 8 hours as needed.   Confirmed delivery date of 05/03/2020,  advised patient that pharmacy will contact them the morning of delivery.   Follow-Up:  Coordination of Enhanced Pharmacy Services and Pharmacist Review Patient would like to have packaging as a 90 day supply now that her medications are stable and she is doing better in her exercise and diet. Patient mentioned a 25 lb weight loss recently. Patient would also like a refill of the Ibuprofen she received for an left ankle injury months ago, she is having some pain in her left ankle and would like refill of Ibuprofen to keep on hand.   [name], CPP notified.  [name], [place] Pharmacist Assistant [phone]

## 2020-04-27 NOTE — Chronic Care Management (AMB) (Signed)
Chronic Care Management Pharmacy  Name: ASA FATH  MRN: [id number] DOB: 1946-01-09  Chief Complaint/ HPI  Dana Hayes,  74 y.o., female presents for their Follow-Up CCM visit with the clinical pharmacist via telephone.  PCP : Owens Loffler, MD  Their chronic conditions include: HTN, HLD, type 2 diabetes, GERD, osteopenia, mild persistent asthma  Office Visits:  07/20/19 OV - DM2, HTN, HLD - stable. Work on [employer]. F/u in 6 months.  Consult Visit:   None in past 6 months  Allergies  Allergen Reactions  . Codeine     REACTION: Nausea and vomiting  . Ace Inhibitors Cough  . Estradiol Rash    REACTION: rash at site of administration, topical rash only   Medications: Outpatient Encounter Medications as of 04/30/2020  Medication Sig  . amLODipine (NORVASC) 5 MG tablet Take 1 tablet (5 mg total) by mouth daily.  Marland Kitchen atorvastatin (LIPITOR) 10 MG tablet Take 1 tablet (10 mg total) by mouth daily.  . hydrochlorothiazide (HYDRODIURIL) 25 MG tablet Take 1 tablet (25 mg total) by mouth daily.  Marland Kitchen albuterol (PROVENTIL HFA;VENTOLIN HFA) 108 (90 Base) MCG/ACT inhaler Inhale 2 puffs into the lungs every 6 (six) hours as needed for wheezing or shortness of breath.  Marland Kitchen aspirin 81 MG tablet Take 81 mg by mouth daily.  . cetirizine (ZYRTEC) 10 MG tablet Take 10 mg daily by mouth.  . cholecalciferol (VITAMIN D) 1000 UNITS tablet Take 1,000 Units by mouth daily.  Marland Kitchen esomeprazole (NEXIUM) 20 MG capsule Take 20 mg by mouth daily at 12 noon. (Patient not taking: Reported on 04/30/2020)  . finasteride (PROPECIA) 1 MG tablet TAKE 1 TABLET BY MOUTH EVERY DAY  . ibuprofen (ADVIL) 800 MG tablet TAKE 1 TABLET BY MOUTH EVERY 8 HOURS AS NEEDED (Patient not taking: Reported on 01/30/2020)  . Multiple Vitamin (MULTIVITAMIN) tablet Take 1 tablet by mouth daily.   (Patient not taking: Reported on 01/30/2020)  . Probiotic Product (PROBIOTIC PO) Take 1 tablet by mouth daily. (Patient not taking: Reported on  01/30/2020)  . VITAMIN E PO Take 1 tablet daily by mouth. (Patient not taking: Reported on 01/30/2020)   Facility-Administered Encounter Medications as of 04/30/2020  Medication  . albuterol (PROVENTIL) (2.5 MG/3ML) 0.083% nebulizer solution 2.5 mg   Current Diagnosis/Assessment:   Financial Resource Strain: Low Risk   . Difficulty of Paying Living Expenses: Not hard at all   Allergies  Allergen Reactions  . Codeine     REACTION: Nausea and vomiting  . Ace Inhibitors Cough  . Estradiol Rash    REACTION: rash at site of administration, topical rash only    SDOH Screenings   Alcohol Screen:   . Last Alcohol Screening Score (AUDIT): Not on file  Depression (PHQ2-9): Low Risk   . PHQ-2 Score: 0  Financial Resource Strain: Low Risk   . Difficulty of Paying Living Expenses: Not hard at all  Food Insecurity: No Food Insecurity  . Worried About Charity fundraiser in the Last Year: Never true  . Ran Out of Food in the Last Year: Never true  Housing: Low Risk   . Last Housing Risk Score: 0  Physical Activity: Sufficiently Active  . Days of Exercise per Week: 3 days  . Minutes of Exercise per Session: 60 min  Social Connections:   . Frequency of Communication with Friends and Family: Not on file  . Frequency of Social Gatherings with Friends and Family: Not on file  . Attends Religious  Services: Not on file  . Active Member of Clubs or Organizations: Not on file  . Attends Archivist Meetings: Not on file  . Marital Status: Not on file  Stress: No Stress Concern Present  . Feeling of Stress : Not at all  Tobacco Use: Low Risk   . Smoking Tobacco Use: Never Smoker  . Smokeless Tobacco Use: Never Used  Transportation Needs: No Transportation Needs  . Lack of Transportation (Medical): No  . Lack of Transportation (Non-Medical): No    Goals Addressed            This Visit's Progress   . Chronic Care Management       CARE PLAN ENTRY  Current Barriers:   . Chronic Disease Management support, education, and care coordination needs related to Hypertension, Hyperlipidemia, Diabetes, GERD, and Osteoporosis   Hypertension BP Readings from Last 3 Encounters:  07/20/19 110/60  04/26/19 (!) 145/84  02/24/19 (!) 144/100 .  Pharmacist Clinical Goal(s): o Over the next 3 months, patient will work with PharmD and providers to maintain BP goal <140/90 mmHg . Current regimen:  . Amlodipine 5 mg -  1 tablet daily . HCTZ 25 mg - 1 tablet daily . Interventions: o Recommend low sodium diet and exercising 150 minutes per week o Recommend patient schedule a visit with Dr. Lorelei Pont for updated labs and routine visit.  o Reviewed home blood pressure readings.  . Patient self care activities - Over the next 3 months, patient will: o Check blood pressure at home for 5-7 days prior to next appointment, morning before breakfast and evening before supper o Ensure daily salt intake < 2300 mg/day o Take medications daily   Hyperlipidemia Lab Results  Component Value Date/Time   LDLCALC 103 (H) 07/18/2019 09:25 AM   LDLDIRECT 162.6 05/29/2010 08:29 AM .  Pharmacist Clinical Goal(s): o Over the next 3 months, patient will work with PharmD and providers to achieve LDL goal < 100 . Current regimen:  o Atorvastatin 10 mg - 1 tablet daily . Interventions: o Recommend continuing exercise and weight loss efforts.  o Recommend scheduling visit with Dr. Lorelei Pont.  . Patient self care activities - Over the next 3 months, patient will: . Achieve exercise goal of 30 minutes, 5 days per week . Incorporate a healthy diet high in vegetables, fruits and whole grains with low-fat dairy products, chicken, fish, legumes, non-tropical vegetable oils and nuts. Limit intake of sweets, sugar-sweetened beverages and red meats.  Diabetes Lab Results  Component Value Date/Time   HGBA1C 7.2 (H) 07/18/2019 09:25 AM   HGBA1C 6.5 (A) 06/23/2018 01:00 PM .  Pharmacist Clinical  Goal(s): o Over the next 3 months, patient will work with PharmD and providers to achieve A1c goal <7% . Current regimen:  o No pharmacotherapy . Interventions: o Recommend updating A1c in the next 2-4 weeks; past due for 6 month follow up with Dr. Lorelei Pont  o Recommend diabetes plate method in addition to exercise and weight loss . Patient self care activities - Over the next 3 months, patient will: o Call office to schedule follow up with Dr. Lorelei Pont and lab appointment at your soonest convenience  o Contact provider with any symptoms of hypoglycemia o Review the diabetes plate method and carbohydrate counting  Osteopenia . Pharmacist Clinical Goal(s) o Over the next 3 months, patient will work with PharmD and providers to improve bone density . Current regimen:  o Vitamin D 1000 units - 1 tablet daily .  Interventions: o Recommend 1200 mg of calcium daily from dietary sources . Patient self care activities - Over the next 3 months, patient will: o Review calcium intake and add supplement if necessary  GERD . Pharmacist Clinical Goal(s) o Over the next 3 months, patient will work with PharmD and providers to reduce unnecessary medications . Current regimen:  o Esomeprazole 20 mg - 1 capsule daily (over the counter) . Interventions: o Recommend attempt to reduce dose and monitor symptoms  Patient self care activities - Over the next 3 months, patient will: o Try reducing Nexium to every other day for 3-4 weeks, then switch to as needed Pepcid. If symptoms return > 2 days/week, you may need to resume daily Nexium.  Medication management . Pharmacist Clinical Goal(s): o Over the next 3 months, patient will work with PharmD and providers to achieve optimal medication adherence . Current pharmacy: [facility] . Interventions o Comprehensive medication review performed. o Utilize [facility] for medication synchronization, packaging and delivery . Patient self care  activities: o Utilize pill packaging for daily medications o Contact Michelle with any medication changes or concerns  Please see past updates related to this goal by clicking on the "Past Updates" button in the selected goal       Hypertension   Office blood pressures are  BP Readings from Last 3 Encounters:  07/20/19 110/60  04/26/19 (!) 145/84  02/24/19 (!) 144/100   BP goal < 140/90 mmHg Patient has failed these meds in the past: lisinopril - cough  Patient is currently controlled on the following medications:  . Amlodipine 5 mg - 1 tablet daily . HCTZ 25 mg - 1 tablet daily  Patient checks BP at home: none Adherence: > 5 day gap between refills   No recent med changes; 7/23 elevated BP, patient had ankle injury/pain, 9/22 elevated BP, pt was having procedure   Update 04/30/2020 - Patient has lost 30 lbs (now weighing 145) since last visit with Dr. Lorelei Pont. She started out on [employer] program with healthy eating 6 mini meals a day but now is maintenance phase. She feels that the program has taught her about portion control and reading labels. She is checking her blood pressure at home. Recent readings 133/76, 126/80.  Has started back at the gym since COVID and exercises for 1 hour at least 3-4 times weekly.   Plan: Continue current medications; Recommend adherence packaging.    Hyperlipidemia/CV Risk   Lipid Panel     Component Value Date/Time   CHOL 173 07/18/2019 0925   TRIG 96.0 07/18/2019 0925   HDL 51.00 07/18/2019 0925   CHOLHDL 3 07/18/2019 0925   VLDL 19.2 07/18/2019 0925   LDLCALC 103 (H) 07/18/2019 0925   LDLDIRECT 162.6 05/29/2010 0829    CBC Latest Ref Rng & Units 07/18/2019 06/17/2018 06/15/2017  WBC 4.0 - 10.5 K/uL 7.4 7.2 10.1  Hemoglobin 12.0 - 15.0 g/dL 15.0 14.9 15.9  Hematocrit 36 - 46 % 45.3 44.7 47.4(H)  Platelets 150 - 400 K/uL 320.0 311.0 382   LDL goal < 100 Patient has tried these meds in past: simvastatin, rosuvastatin (unknown  response) Patient is currently uncontrolled on the following medications: . Atorvastatin 10 mg - 1 tablet daily . Aspirin 81 mg 1 tablet daily  Adherence: patient is > 30 days past due for refill CBC stable, aspirin for primary prevention in setting of diabetes   Update 04/30/2020 - Eating egg whites and a piece of 2 carb bread during  the week with her healthier eating lately. Eating lots of fresh vegetables with lean protein. Has started introducing fruits in her diet as well.   Plan: Continue current medications; Recommend adherence packaging.   Diabetes   Recent Relevant Labs: Lab Results  Component Value Date/Time   HGBA1C 7.2 (H) 07/18/2019 09:25 AM   HGBA1C 6.5 (A) 06/23/2018 01:00 PM   GFR 85.15 07/18/2019 09:25 AM   GFR 86.37 06/17/2018 08:36 AM   MICROALBUR <0.7 07/18/2019 09:47 AM    Checking BG: none  A1c goal < 7% Patient has failed these meds in past: none Patient is currently uncontrolled on the following medications:   No pharmacotherapy  We discussed: Progression from pre-diabetes to diabetes; consideration of metformin if lifestyle measures ineffective  Diet/exercise: reports weight loss of about 15 lbs since December; started back in the gym 3 days a week, walking dog too, cut back on portions   Update 04/30/2020 - Patient does not check her blood sugar but has lost 30 lbs in the past year. She hopes that her updated labs with Dr. Lorelei Pont will reflect her lifestyle improvements. We discussed that if A1C remains elevated medications may be needed.   Plan: Continue control with diet and exercise; Recommend repeat A1c/scheduling office visit with PCP at [place], then f/u with PharmD in 3 months.    Vaccines   Reviewed and discussed patient's vaccination history.    Immunization History  Administered Date(s) Administered  . Fluad Quad(high Dose 65+) 04/05/2019  . Influenza Split 06/09/2011  . Influenza Whole 06/04/2008, 06/12/2009, 06/05/2010   . Influenza, High Dose Seasonal PF 05/07/2015  . Influenza,inj,Quad PF,6+ Mos 07/05/2013, 05/10/2014, 05/09/2016, 05/18/2017, 05/20/2018  . Pneumococcal Conjugate-13 03/02/2014  . Pneumococcal Polysaccharide-23 05/09/2016  . Tdap 08/23/2012  . Zoster 07/11/2008   Plan: Recommended patient receive Shingrix and annual flu shot.  Medication Management   Pharmacy/Benefits: [facility] Adherence: Patient's statin adherence is listed 60-69%. Patient utilizing packaging and delivery with [facility].   We discussed: adherence packaging  Verbal consent obtained for [facility] enhanced pharmacy services (medication synchronization, adherence packaging, delivery coordination). A medication sync plan was created to allow patient to get all medications delivered once every 30 to 90 days per patient preference. Patient understands they have freedom to choose pharmacy and clinical pharmacist will coordinate care between all prescribers and [facility].  Plan: Utilize [facility] pharmacy for medication synchronization, packaging and delivery  Follow up: 3 months telephone visit to review updated blood work and physical results. Encouraged patient to schedule a follow-up visit with Dr. Lorelei Pont. June visit was cancelled and never rescheduled.   Sherre Poot, PharmD, [facility] Clinical Pharmacist [facility] [phone] (office) [phone] (mobile)

## 2020-04-30 ENCOUNTER — Other Ambulatory Visit: Payer: Self-pay

## 2020-04-30 ENCOUNTER — Ambulatory Visit: Payer: PPO

## 2020-04-30 DIAGNOSIS — E785 Hyperlipidemia, unspecified: Secondary | ICD-10-CM

## 2020-04-30 DIAGNOSIS — E119 Type 2 diabetes mellitus without complications: Secondary | ICD-10-CM

## 2020-04-30 DIAGNOSIS — I10 Essential (primary) hypertension: Secondary | ICD-10-CM

## 2020-04-30 DIAGNOSIS — E782 Mixed hyperlipidemia: Secondary | ICD-10-CM

## 2020-04-30 NOTE — Patient Instructions (Signed)
Visit Information  Goals Addressed            This Visit's Progress   . Chronic Care Management       CARE PLAN ENTRY  Current Barriers:  . Chronic Disease Management support, education, and care coordination needs related to Hypertension, Hyperlipidemia, Diabetes, GERD, and Osteoporosis   Hypertension BP Readings from Last 3 Encounters:  07/20/19 110/60  04/26/19 (!) 145/84  02/24/19 (!) 144/100 .  Pharmacist Clinical Goal(s): o Over the next 3 months, patient will work with PharmD and providers to maintain BP goal <140/90 mmHg . Current regimen:  . Amlodipine 5 mg -  1 tablet daily . HCTZ 25 mg - 1 tablet daily . Interventions: o Recommend low sodium diet and exercising 150 minutes per week o Recommend patient schedule a visit with Dr. Lorelei Pont for updated labs and routine visit.  o Reviewed home blood pressure readings.  . Patient self care activities - Over the next 3 months, patient will: o Check blood pressure at home for 5-7 days prior to next appointment, morning before breakfast and evening before supper o Ensure daily salt intake < 2300 mg/day o Take medications daily   Hyperlipidemia Lab Results  Component Value Date/Time   LDLCALC 103 (H) 07/18/2019 09:25 AM   LDLDIRECT 162.6 05/29/2010 08:29 AM .  Pharmacist Clinical Goal(s): o Over the next 3 months, patient will work with PharmD and providers to achieve LDL goal < 100 . Current regimen:  o Atorvastatin 10 mg - 1 tablet daily . Interventions: o Recommend continuing exercise and weight loss efforts.  o Recommend scheduling visit with Dr. Lorelei Pont.  . Patient self care activities - Over the next 3 months, patient will: . Achieve exercise goal of 30 minutes, 5 days per week . Incorporate a healthy diet high in vegetables, fruits and whole grains with low-fat dairy products, chicken, fish, legumes, non-tropical vegetable oils and nuts. Limit intake of sweets, sugar-sweetened beverages and red  meats.  Diabetes Lab Results  Component Value Date/Time   HGBA1C 7.2 (H) 07/18/2019 09:25 AM   HGBA1C 6.5 (A) 06/23/2018 01:00 PM .  Pharmacist Clinical Goal(s): o Over the next 3 months, patient will work with PharmD and providers to achieve A1c goal <7% . Current regimen:  o No pharmacotherapy . Interventions: o Recommend updating A1c in the next 2-4 weeks; past due for 6 month follow up with Dr. Lorelei Pont  o Recommend diabetes plate method in addition to exercise and weight loss . Patient self care activities - Over the next 3 months, patient will: o Call office to schedule follow up with Dr. Lorelei Pont and lab appointment at your soonest convenience  o Contact provider with any symptoms of hypoglycemia o Review the diabetes plate method and carbohydrate counting  Osteopenia . Pharmacist Clinical Goal(s) o Over the next 3 months, patient will work with PharmD and providers to improve bone density . Current regimen:  o Vitamin D 1000 units - 1 tablet daily . Interventions: o Recommend 1200 mg of calcium daily from dietary sources . Patient self care activities - Over the next 3 months, patient will: o Review calcium intake and add supplement if necessary  GERD . Pharmacist Clinical Goal(s) o Over the next 3 months, patient will work with PharmD and providers to reduce unnecessary medications . Current regimen:  o Esomeprazole 20 mg - 1 capsule daily (over the counter) . Interventions: o Recommend attempt to reduce dose and monitor symptoms  Patient self care activities -  Over the next 3 months, patient will: o Try reducing Nexium to every other day for 3-4 weeks, then switch to as needed Pepcid. If symptoms return > 2 days/week, you may need to resume daily Nexium.  Medication management . Pharmacist Clinical Goal(s): o Over the next 3 months, patient will work with PharmD and providers to achieve optimal medication adherence . Current pharmacy: [facility] . Interventions o Comprehensive medication review performed. o Utilize [facility] pharmacy for medication synchronization, packaging and delivery . Patient self care activities: o Utilize pill packaging for daily medications o Contact Michelle with any medication changes or concerns  Please see past updates related to this goal by clicking on the "Past Updates" button in the selected goal        The patient verbalized understanding of instructions provided today and declined a print copy of patient instruction materials.   Telephone follow up appointment with pharmacy team member scheduled for: 07/2020  Sherre Poot, PharmD, [facility] Clinical Pharmacist [facility] [phone] (office) [phone] (mobile)

## 2020-04-30 NOTE — Progress Notes (Signed)
I have collaborated with the care management provider regarding care management and care coordination activities outlined in this encounter and have reviewed this encounter including documentation in the note and care plan. I am certifying that I agree with the content of this note and encounter as supervising physician.  

## 2020-05-01 ENCOUNTER — Telehealth: Payer: PPO

## 2020-05-22 ENCOUNTER — Other Ambulatory Visit: Payer: Self-pay | Admitting: Family Medicine

## 2020-05-22 DIAGNOSIS — Z1231 Encounter for screening mammogram for malignant neoplasm of breast: Secondary | ICD-10-CM

## 2020-05-24 ENCOUNTER — Telehealth: Payer: Self-pay

## 2020-05-24 NOTE — Chronic Care Management (AMB) (Signed)
Chronic Care Management Pharmacy Assistant   Name: Dana Hayes  MRN: [id number] DOB: January 04, 1946  Reason for Encounter: Medication Review/ Monthly Dispensing Call  Patient Questions:  1.  Have you seen any other providers since your last visit? No  2.  Any changes in your medicines or health? No  PCP : Owens Loffler, MD  Allergies:   Allergies  Allergen Reactions  . Codeine     REACTION: Nausea and vomiting  . Ace Inhibitors Cough  . Estradiol Rash    REACTION: rash at site of administration, topical rash only    Medications: Outpatient Encounter Medications as of 05/24/2020  Medication Sig  . albuterol (PROVENTIL HFA;VENTOLIN HFA) 108 (90 Base) MCG/ACT inhaler Inhale 2 puffs into the lungs every 6 (six) hours as needed for wheezing or shortness of breath.  Marland Kitchen amLODipine (NORVASC) 5 MG tablet Take 1 tablet (5 mg total) by mouth daily.  Marland Kitchen aspirin 81 MG tablet Take 81 mg by mouth daily.  Marland Kitchen atorvastatin (LIPITOR) 10 MG tablet Take 1 tablet (10 mg total) by mouth daily.  . cetirizine (ZYRTEC) 10 MG tablet Take 10 mg daily by mouth.  . cholecalciferol (VITAMIN D) 1000 UNITS tablet Take 1,000 Units by mouth daily.  Marland Kitchen esomeprazole (NEXIUM) 20 MG capsule Take 20 mg by mouth daily at 12 noon. (Patient not taking: Reported on 04/30/2020)  . finasteride (PROPECIA) 1 MG tablet TAKE 1 TABLET BY MOUTH EVERY DAY  . hydrochlorothiazide (HYDRODIURIL) 25 MG tablet Take 1 tablet (25 mg total) by mouth daily.  Marland Kitchen ibuprofen (ADVIL) 800 MG tablet TAKE 1 TABLET BY MOUTH EVERY 8 HOURS AS NEEDED (Patient not taking: Reported on 01/30/2020)  . Multiple Vitamin (MULTIVITAMIN) tablet Take 1 tablet by mouth daily.   (Patient not taking: Reported on 01/30/2020)  . Probiotic Product (PROBIOTIC PO) Take 1 tablet by mouth daily. (Patient not taking: Reported on 01/30/2020)  . VITAMIN E PO Take 1 tablet daily by mouth. (Patient not taking: Reported on 01/30/2020)   Facility-Administered Encounter Medications  as of 05/24/2020  Medication  . albuterol (PROVENTIL) (2.5 MG/3ML) 0.083% nebulizer solution 2.5 mg    Current Diagnosis: Patient Active Problem List   Diagnosis Date Noted  . Osteopenia 02/24/2019  . Class 1 obesity with serious comorbidity and body mass index (BMI) of 32.0 to 32.9 in adult 06/23/2018  . Diabetes mellitus type 2, diet-controlled ([place]) 06/23/2018  . DIVERTICULOSIS OF COLON 12/28/2007  . Hyperlipidemia 10/21/2007  . Essential hypertension 10/21/2007  . ALLERGIC RHINITIS 10/21/2007  . Mild persistent asthma with acute exacerbation in adult 10/21/2007  . GERD 10/21/2007  . NEPHROLITHIASIS, HX OF 10/21/2007   Reviewed chart for medication changes ahead of medication coordination call.  No OVs, Consults, or hospital visits since last care coordination call/Pharmacist visit.  No medication changes indicated.  BP Readings from Last 3 Encounters:  07/20/19 110/60  04/26/19 (!) 145/84  02/24/19 (!) 144/100    Lab Results  Component Value Date   HGBA1C 7.2 (H) 07/18/2019     Patient obtains medications through Adherence Packaging  90 Days   Last adherence delivery included: HCTZ 25 mg -1 tablet daily(breakfast), Amlodipine 5 mg -1 tablet daily(breakfast), Atorvastatin 10 mg -1 tablet daily(breakfast),  Ibuprofen 800 mg- 1 tablet every 8 hours as needed.   Patient did not decline any medications last month.  Patient is due for next adherence delivery call on: 05/25/2020. 05/24/2020- Called patient and reviewed medications and no delivery needed at this time.  This delivery to include: None- Patient does not need any refills a this time.   Short fill or acute fill not needed.  Patient declined the following medications: HCTZ 25 mg -1 tablet daily(breakfast), Amlodipine 5 mg -1 tablet daily(breakfast), Atorvastatin 10 mg -1 tablet daily(breakfast),  Ibuprofen 800 mg- 1 tablet every 8 hours as needed due to receiving a 90 day supply package on 05/02/2020.    Patient aware we will call next month to review medications.  Follow-Up:  Coordination of Enhanced Pharmacy Services and Pharmacist Review- Patient does not need any refills at this time. [name], CPP notified.  [name], [place] Pharmacist Assistant [phone]

## 2020-06-20 ENCOUNTER — Telehealth: Payer: Self-pay | Admitting: *Deleted

## 2020-06-20 ENCOUNTER — Ambulatory Visit: Payer: Self-pay

## 2020-06-20 DIAGNOSIS — E782 Mixed hyperlipidemia: Secondary | ICD-10-CM

## 2020-06-20 DIAGNOSIS — I10 Essential (primary) hypertension: Secondary | ICD-10-CM

## 2020-06-20 MED ORDER — HYDROCHLOROTHIAZIDE 25 MG PO TABS: 25.0000 mg | ORAL_TABLET | Freq: Every day | ORAL | 0 refills | Status: DC

## 2020-06-20 MED ORDER — AMLODIPINE BESYLATE 5 MG PO TABS: 5.0000 mg | ORAL_TABLET | Freq: Every day | ORAL | 0 refills | Status: DC

## 2020-06-20 MED ORDER — ATORVASTATIN CALCIUM 10 MG PO TABS: 10.0000 mg | ORAL_TABLET | Freq: Every day | ORAL | 0 refills | Status: DC

## 2020-06-20 NOTE — Telephone Encounter (Signed)
-----   Message from [name], [facility] sent at 06/20/2020  1:54 PM EST ----- Regarding: Refills Ms. Debenedetto has an appointment scheduled on 08/02/20 with Dr. Lorelei Pont but will need refills on her maintenance medications this month: HCTZ, amlodipine, atorvastatin. Please send to [facility].   Thanks!  [name], PharmD Clinical Pharmacist [facility] [phone]

## 2020-06-20 NOTE — Chronic Care Management (AMB) (Signed)
Chronic Care Management Pharmacy  Name: Dana Hayes  MRN: [id number] DOB: 04-Oct-1945  Chief Complaint/ HPI  Dana Hayes,  74 y.o., female contacted by telephone for monthly medication dispensing call  PCP : Owens Loffler, MD  Their chronic conditions include: HTN, HLD, type 2 diabetes, GERD, osteopenia, mild persistent asthma  Office Visits:  07/20/19 OV - DM2, HTN, HLD - stable. Work on [employer]. F/u in 6 months.  Consult Visit:   None in past 6 months  Allergies  Allergen Reactions  . Codeine     REACTION: Nausea and vomiting  . Ace Inhibitors Cough  . Estradiol Rash    REACTION: rash at site of administration, topical rash only   Medications: Outpatient Encounter Medications as of 06/20/2020  Medication Sig  . albuterol (PROVENTIL HFA;VENTOLIN HFA) 108 (90 Base) MCG/ACT inhaler Inhale 2 puffs into the lungs every 6 (six) hours as needed for wheezing or shortness of breath.  Marland Kitchen amLODipine (NORVASC) 5 MG tablet Take 1 tablet (5 mg total) by mouth daily.  Marland Kitchen aspirin 81 MG tablet Take 81 mg by mouth daily.  Marland Kitchen atorvastatin (LIPITOR) 10 MG tablet Take 1 tablet (10 mg total) by mouth daily.  . cetirizine (ZYRTEC) 10 MG tablet Take 10 mg daily by mouth.  . cholecalciferol (VITAMIN D) 1000 UNITS tablet Take 1,000 Units by mouth daily.  Marland Kitchen esomeprazole (NEXIUM) 20 MG capsule Take 20 mg by mouth daily at 12 noon. (Patient not taking: Reported on 04/30/2020)  . finasteride (PROPECIA) 1 MG tablet TAKE 1 TABLET BY MOUTH EVERY DAY  . hydrochlorothiazide (HYDRODIURIL) 25 MG tablet Take 1 tablet (25 mg total) by mouth daily.  Marland Kitchen ibuprofen (ADVIL) 800 MG tablet TAKE 1 TABLET BY MOUTH EVERY 8 HOURS AS NEEDED (Patient not taking: Reported on 01/30/2020)  . Multiple Vitamin (MULTIVITAMIN) tablet Take 1 tablet by mouth daily.   (Patient not taking: Reported on 01/30/2020)  . Probiotic Product (PROBIOTIC PO) Take 1 tablet by mouth daily. (Patient not taking: Reported on 01/30/2020)  .  VITAMIN E PO Take 1 tablet daily by mouth. (Patient not taking: Reported on 01/30/2020)   Facility-Administered Encounter Medications as of 06/20/2020  Medication  . albuterol (PROVENTIL) (2.5 MG/3ML) 0.083% nebulizer solution 2.5 mg    Reviewed chart for medication changes ahead of medication coordination call.   No office visits, consults, or hospital visits since. Scheduled for PCP visit next month.   BP Readings from Last 3 Encounters:  07/20/19 110/60  04/26/19 (!) 145/84  02/24/19 (!) 144/100    Lab Results  Component Value Date   HGBA1C 7.2 (H) 07/18/2019    Patient obtains medications through Adherence Packaging  90 Days   Last adherence delivery included: 05/02/20, 90DS  HCTZ 25 mg -  1 at lunch    Amlodipine 5 mg - 1 at breakfast   Atorvastatin 10 mg - 1 at breakfast   Patient is due for next adherence delivery on: 08/02/20 Called patient to review medications and coordinate delivery.   This delivery to include:   Ibuprofen 800 mg   Patient reports taking ibuprofen 800 mg at least once weekly due to ankle injury. She is aware this is last refill. Recommend using sparingly due to risk of increased BP, CV events, renal impairment, bleeding. She takes it with food and glass of water. Reports the following BP readings: checking once or twice a week with arm cuff 138/80 mmHg, 136/88, 132/86, 133/76 Counseled on appropriate technique for BP  monitoring including using an arm cuff, sitting still for 5 minutes, avoid caffeine for 30 minutes, check first thing in the morning. Reports her weight is down from 175 to 143 lbs. We rescheduled her CCM appointment for January after her labwork.   She purchases OTC medications elsewhere: esomeprazole, cetirizine, aspirin, vitamin D  Confirmed delivery date of 06/21/20, pharmacy will contact them the morning of delivery.  [name], PharmD Clinical Pharmacist [facility] at [facility] [phone]

## 2020-07-10 ENCOUNTER — Other Ambulatory Visit: Payer: Self-pay | Admitting: Family Medicine

## 2020-07-10 NOTE — Telephone Encounter (Signed)
Last office visit 07/20/2019 for CPE.  Last refilled 01/05/2020 for #90 with 1 refill.  CPE 08/02/2020.

## 2020-07-11 ENCOUNTER — Other Ambulatory Visit: Payer: Self-pay

## 2020-07-11 ENCOUNTER — Ambulatory Visit
Admission: RE | Admit: 2020-07-11 | Discharge: 2020-07-11 | Disposition: A | Discharge status: Home / Self Care | Payer: PPO | Source: Ambulatory Visit | Attending: Family Medicine | Admitting: Family Medicine

## 2020-07-11 DIAGNOSIS — Z1231 Encounter for screening mammogram for malignant neoplasm of breast: Secondary | ICD-10-CM | POA: Insufficient documentation

## 2020-07-13 ENCOUNTER — Other Ambulatory Visit: Payer: Self-pay | Admitting: Family Medicine

## 2020-07-13 DIAGNOSIS — E782 Mixed hyperlipidemia: Secondary | ICD-10-CM

## 2020-07-13 DIAGNOSIS — Z79899 Other long term (current) drug therapy: Secondary | ICD-10-CM

## 2020-07-13 DIAGNOSIS — E119 Type 2 diabetes mellitus without complications: Secondary | ICD-10-CM

## 2020-07-13 DIAGNOSIS — E559 Vitamin D deficiency, unspecified: Secondary | ICD-10-CM

## 2020-07-16 ENCOUNTER — Other Ambulatory Visit: Payer: Self-pay | Admitting: Family Medicine

## 2020-07-19 ENCOUNTER — Telehealth: Payer: Self-pay

## 2020-07-19 NOTE — Chronic Care Management (AMB) (Addendum)
Chronic Care Management Pharmacy Assistant   Name: Dana Hayes  MRN: [id number] DOB: 1946/03/21  Reason for Encounter: Medication Review  Patient Questions:  1.  Have you seen any other providers since your last visit? No  2.  Any changes in your medicines or health? No  PCP : Owens Loffler, MD  Allergies:   Allergies  Allergen Reactions   Codeine     REACTION: Nausea and vomiting   Ace Inhibitors Cough   Estradiol Rash    REACTION: rash at site of administration, topical rash only    Medications: Outpatient Encounter Medications as of 07/19/2020  Medication Sig   ibuprofen (ADVIL) 800 MG tablet TAKE ONE TABLET BY MOUTH EVERY 8 HOURS AS NEEDED   albuterol (PROVENTIL HFA;VENTOLIN HFA) 108 (90 Base) MCG/ACT inhaler Inhale 2 puffs into the lungs every 6 (six) hours as needed for wheezing or shortness of breath.   amLODipine (NORVASC) 5 MG tablet Take 1 tablet (5 mg total) by mouth daily.   aspirin 81 MG tablet Take 81 mg by mouth daily.   atorvastatin (LIPITOR) 10 MG tablet Take 1 tablet (10 mg total) by mouth daily.   cetirizine (ZYRTEC) 10 MG tablet Take 10 mg daily by mouth.   cholecalciferol (VITAMIN D) 1000 UNITS tablet Take 1,000 Units by mouth daily.   esomeprazole (NEXIUM) 20 MG capsule Take 20 mg by mouth daily at 12 noon. (Patient not taking: Reported on 04/30/2020)   finasteride (PROPECIA) 1 MG tablet TAKE 1 TABLET BY MOUTH EVERY DAY   hydrochlorothiazide (HYDRODIURIL) 25 MG tablet Take 1 tablet (25 mg total) by mouth daily.   Multiple Vitamin (MULTIVITAMIN) tablet Take 1 tablet by mouth daily.   (Patient not taking: Reported on 01/30/2020)   Probiotic Product (PROBIOTIC PO) Take 1 tablet by mouth daily. (Patient not taking: Reported on 01/30/2020)   VITAMIN E PO Take 1 tablet daily by mouth. (Patient not taking: Reported on 01/30/2020)   Facility-Administered Encounter Medications as of 07/19/2020  Medication   albuterol (PROVENTIL) (2.5 MG/3ML) 0.083%  nebulizer solution 2.5 mg    Current Diagnosis: Patient Active Problem List   Diagnosis Date Noted   Osteopenia 02/24/2019   Class 1 obesity with serious comorbidity and body mass index (BMI) of 32.0 to 32.9 in adult 06/23/2018   Diabetes mellitus type 2, diet-controlled ([place]) 06/23/2018   DIVERTICULOSIS OF COLON 12/28/2007   Hyperlipidemia 10/21/2007   Essential hypertension 10/21/2007   ALLERGIC RHINITIS 10/21/2007   Mild persistent asthma with acute exacerbation in adult 10/21/2007   GERD 10/21/2007   NEPHROLITHIASIS, HX OF 10/21/2007   Reviewed chart for medication changes ahead of medication coordination call.  No OVs, Consults, or hospital visits since last care coordination call/Pharmacist visit.   No medication changes indicated.  BP Readings from Last 3 Encounters:  07/20/19 110/60  04/26/19 (!) 145/84  02/24/19 (!) 144/100    Lab Results  Component Value Date   HGBA1C 7.2 (H) 07/18/2019    Dana Hayes states she is doing very well. She does not monitor her blood sugar but does have a few readings for blood pressure.  07/20/20- 141/91 07/17/20- 133/63 07/10/20- 135/87  Patient obtains medications through Adherence Packaging  90 Days   Last adherence delivery included:  05/02/20 90 DS adherence packs Amlodipine 5 mg - 1 tablet daily(breakfast) Atorvastatin 10 mg - 1 tablet daily(breakfast) HCTZ 25 mg - 1 tablet daily(breakfast) 06/20/20 30DS, vials Ibuprofen 800 mg- 1 tablet every 8 hours as needed  Patient declined the following medications last month : All meds due to adherence packaging delivered 9/29 90 DS  Patient is due for next adherence delivery on: 07/26/20  Called patient and reviewed medications and coordinated delivery.  This delivery to include: 07/26/20 90 DS, adherence packaging: Amlodipine 5 mg - 1 tablet daily(breakfast) Atorvastatin 10 mg - 1 tablet daily(breakfast) HCTZ 25 mg - 1 tablet daily(breakfast) 07/26/20 30 DS,  vials: Ibuprofen 800 mg- 1 tablet every 8 hours as needed - VIALS  Patient will not need a short fill of any medications, prior to adherence delivery.   Patient declined the following medications : NONE  Patient does not need refills for any medications  Confirmed delivery date of 07/26/20, advised patient that pharmacy will contact them the morning of delivery.  Follow-Up:  Comptroller and Pharmacist Review   [name], CPP reviewed.  Margaretmary Dys, [place] [facility] Assistant [phone]  [name], PharmD Clinical Pharmacist [facility] [phone]

## 2020-07-20 ENCOUNTER — Other Ambulatory Visit (INDEPENDENT_AMBULATORY_CARE_PROVIDER_SITE_OTHER): Payer: PPO

## 2020-07-20 ENCOUNTER — Telehealth: Payer: PPO

## 2020-07-20 ENCOUNTER — Other Ambulatory Visit: Payer: Self-pay

## 2020-07-20 DIAGNOSIS — E782 Mixed hyperlipidemia: Secondary | ICD-10-CM | POA: Diagnosis not present

## 2020-07-20 DIAGNOSIS — E559 Vitamin D deficiency, unspecified: Secondary | ICD-10-CM

## 2020-07-20 DIAGNOSIS — Z79899 Other long term (current) drug therapy: Secondary | ICD-10-CM

## 2020-07-20 DIAGNOSIS — E119 Type 2 diabetes mellitus without complications: Secondary | ICD-10-CM | POA: Diagnosis not present

## 2020-07-20 LAB — CBC WITH DIFFERENTIAL/PLATELET
Basophils Absolute: 0.1 10*3/uL (ref 0.0–0.1)
Basophils Relative: 1.4 % (ref 0.0–3.0)
Eosinophils Absolute: 0.3 10*3/uL (ref 0.0–0.7)
Eosinophils Relative: 4 % (ref 0.0–5.0)
HCT: 44.7 % (ref 36.0–46.0)
Hemoglobin: 15.1 g/dL — ABNORMAL HIGH (ref 12.0–15.0)
Lymphocytes Relative: 39.1 % (ref 12.0–46.0)
Lymphs Abs: 3.2 10*3/uL (ref 0.7–4.0)
MCHC: 33.7 g/dL (ref 30.0–36.0)
MCV: 82.4 fl (ref 78.0–100.0)
Monocytes Absolute: 0.8 10*3/uL (ref 0.1–1.0)
Monocytes Relative: 10.1 % (ref 3.0–12.0)
Neutro Abs: 3.8 10*3/uL (ref 1.4–7.7)
Neutrophils Relative %: 45.4 % (ref 43.0–77.0)
Platelets: 341 10*3/uL (ref 150.0–400.0)
RBC: 5.42 Mil/uL — ABNORMAL HIGH (ref 3.87–5.11)
RDW: 13.7 % (ref 11.5–15.5)
WBC: 8.3 10*3/uL (ref 4.0–10.5)

## 2020-07-20 LAB — BASIC METABOLIC PANEL
BUN: 21 mg/dL (ref 6–23)
CO2: 27 mEq/L (ref 19–32)
Calcium: 9 mg/dL (ref 8.4–10.5)
Chloride: 101 mEq/L (ref 96–112)
Creatinine, Ser: 0.7 mg/dL (ref 0.40–1.20)
GFR: 86.3 mL/min (ref >60.00)
Glucose, Bld: 114 mg/dL — ABNORMAL HIGH (ref 70–99)
Potassium: 3.6 mEq/L (ref 3.5–5.1)
Sodium: 136 mEq/L (ref 135–145)

## 2020-07-20 LAB — MICROALBUMIN / CREATININE URINE RATIO
Creatinine,U: 25 mg/dL
Microalb Creat Ratio: 2.8 mg/g (ref 0.0–30.0)
Microalb, Ur: <0.7 mg/dL (ref 0.0–1.9)

## 2020-07-20 LAB — HEPATIC FUNCTION PANEL
ALT: 21 U/L (ref 0–35)
AST: 17 U/L (ref 0–37)
Albumin: 4.3 g/dL (ref 3.5–5.2)
Alkaline Phosphatase: 79 U/L (ref 39–117)
Bilirubin, Direct: 0.1 mg/dL (ref 0.0–0.3)
Total Bilirubin: 0.4 mg/dL (ref 0.2–1.2)
Total Protein: 6.7 g/dL (ref 6.0–8.3)

## 2020-07-20 LAB — VITAMIN D 25 HYDROXY (VIT D DEFICIENCY, FRACTURES): VITD: 71.87 ng/mL (ref 30.00–100.00)

## 2020-07-20 LAB — LIPID PANEL
Cholesterol: 164 mg/dL (ref 0–200)
HDL: 58.1 mg/dL (ref >39.00)
LDL Cholesterol: 83 mg/dL (ref 0–99)
NonHDL: 105.72
Total CHOL/HDL Ratio: 3
Triglycerides: 113 mg/dL (ref 0.0–149.0)
VLDL: 22.6 mg/dL (ref 0.0–40.0)

## 2020-07-20 LAB — HEMOGLOBIN A1C: Hgb A1c MFr Bld: 6.2 % (ref 4.6–6.5)

## 2020-07-24 ENCOUNTER — Ambulatory Visit (INDEPENDENT_AMBULATORY_CARE_PROVIDER_SITE_OTHER): Payer: PPO

## 2020-07-24 ENCOUNTER — Other Ambulatory Visit: Payer: Self-pay

## 2020-07-24 DIAGNOSIS — Z0000 Encounter for general adult medical examination without abnormal findings: Secondary | ICD-10-CM

## 2020-07-24 NOTE — Progress Notes (Signed)
Subjective:   Dana Hayes is a 74 y.o. female who presents for Medicare Annual (Subsequent) preventive examination.  Review of Systems: N/A     I connected with the patient today by telephone and verified that I am speaking with the correct person using two identifiers. Location patient: home Location nurse: work Persons participating in the telephone visit: patient, nurse.   I discussed the limitations, risks, security and privacy concerns of performing an evaluation and management service by telephone and the availability of in person appointments. I also discussed with the patient that there may be a patient responsible charge related to this service. The patient expressed understanding and verbally consented to this telephonic visit.        Cardiac Risk Factors include: advanced age (>41men, >97 women);diabetes mellitus;hypertension;Other (see comment), Risk factor comments: hyperlipidemia     Objective:    Today's Vitals   There is no height or weight on file to calculate BMI.  Advanced Directives 07/24/2020 07/19/2019 04/26/2019 06/17/2018 06/15/2017 05/18/2017 05/09/2016  Does Patient Have a Medical Advance Directive? Yes Yes Yes Yes Yes Yes Yes  Type of Paramedic of [place];Living will [place];Living will - [place];Living will - [place];Living will [place];Living will  Does patient want to make changes to medical advance directive? - - - - - - No - Patient declined  Copy of [place] in Chart? No - copy requested No - copy requested - No - copy requested - No - copy requested No - copy requested    Current Medications (verified) Outpatient Encounter Medications as of 07/24/2020  Medication Sig  . albuterol (PROVENTIL HFA;VENTOLIN HFA) 108 (90 Base) MCG/ACT inhaler Inhale 2 puffs into the lungs every 6 (six) hours as needed for wheezing or  shortness of breath.  Marland Kitchen amLODipine (NORVASC) 5 MG tablet Take 1 tablet (5 mg total) by mouth daily.  Marland Kitchen atorvastatin (LIPITOR) 10 MG tablet Take 1 tablet (10 mg total) by mouth daily.  . cetirizine (ZYRTEC) 10 MG tablet Take 10 mg daily by mouth.  . cholecalciferol (VITAMIN D) 1000 UNITS tablet Take 1,000 Units by mouth daily.  Marland Kitchen esomeprazole (NEXIUM) 20 MG capsule Take 20 mg by mouth daily at 12 noon.  . finasteride (PROPECIA) 1 MG tablet TAKE 1 TABLET BY MOUTH EVERY DAY  . hydrochlorothiazide (HYDRODIURIL) 25 MG tablet Take 1 tablet (25 mg total) by mouth daily.  Marland Kitchen ibuprofen (ADVIL) 800 MG tablet TAKE ONE TABLET BY MOUTH EVERY 8 HOURS AS NEEDED  . Multiple Vitamin (MULTIVITAMIN) tablet Take 1 tablet by mouth daily.  . Probiotic Product (PROBIOTIC PO) Take 1 tablet by mouth daily.  Marland Kitchen VITAMIN E PO Take 1 tablet by mouth daily.  Marland Kitchen aspirin 81 MG tablet Take 81 mg by mouth daily.   Facility-Administered Encounter Medications as of 07/24/2020  Medication  . albuterol (PROVENTIL) (2.5 MG/3ML) 0.083% nebulizer solution 2.5 mg    Allergies (verified) Codeine, Ace inhibitors, and Estradiol   History: Past Medical History:  Diagnosis Date  . Allergy   . Asthma   . GERD (gastroesophageal reflux disease)   . History of kidney stones   . Hyperlipidemia   . Hypertension   . Obesity    Past Surgical History:  Procedure Laterality Date  . ABDOMINAL HYSTERECTOMY    . COLONOSCOPY WITH PROPOFOL N/A 04/26/2019   Procedure: COLONOSCOPY WITH PROPOFOL;  Surgeon: Jonathon Bellows, MD;  Location: [facility];  Service: Gastroenterology;  Laterality: N/A;  . LITHOTRIPSY      TIMES 2 1990   Family History  Problem Relation Age of Onset  . Coronary artery disease Mother   . Diabetes Mother   . Coronary artery disease Brother   . Heart attack Brother   . Breast cancer Maternal Aunt        early 10s   Social History   Socioeconomic History  . Marital status: Married    Spouse name: Not on file   . Number of children: Not on file  . Years of education: Not on file  . Highest education level: Not on file  Occupational History  . Occupation: Health visitor: [employer]  Tobacco Use  . Smoking status: Never Smoker  . Smokeless tobacco: Never Used  Vaping Use  . Vaping Use: Never used  Substance and Sexual Activity  . Alcohol use: Yes    Comment: occassionally a glass of wine or beer  . Drug use: No  . Sexual activity: Yes  Other Topics Concern  . Not on file  Social History Narrative   REGULAR EXERCISE: YES- WALKS 3-4-DAYS A WEEK      Diet: no fast food, instead veggies, loves baking   Social Determinants of Health   Financial Resource Strain: Low Risk   . Difficulty of Paying Living Expenses: Not hard at all  Food Insecurity: No Food Insecurity  . Worried About Charity fundraiser in the Last Year: Never true  . Ran Out of Food in the Last Year: Never true  Transportation Needs: No Transportation Needs  . Lack of Transportation (Medical): No  . Lack of Transportation (Non-Medical): No  Physical Activity: Insufficiently Active  . Days of Exercise per Week: 3 days  . Minutes of Exercise per Session: 40 min  Stress: No Stress Concern Present  . Feeling of Stress : Not at all  Social Connections: Not on file    Tobacco Counseling Counseling given: Not Answered   Clinical Intake:  Pre-visit preparation completed: Yes  Pain : 0-10 Pain Type: Acute pain Pain Location: Knee Pain Orientation: Left Pain Descriptors / Indicators: Shooting Pain Onset: More than a month ago Pain Frequency: Intermittent     Nutritional Risks: None Diabetes: Yes CBG done?: No Did pt. bring in CBG monitor from home?: No  How often do you need to have someone help you when you read instructions, pamphlets, or other written materials from your doctor or pharmacy?: 1 - Never What is the last grade level you completed in school?: some college  Diabetic: Yes Nutrition  Risk Assessment:  Has the patient had any N/V/D within the last 2 months?  No  Does the patient have any non-healing wounds?  No  Has the patient had any unintentional weight loss or weight gain?  No   Diabetes:  Is the patient diabetic?  Yes  If diabetic, was a CBG obtained today?  No  telephone visit  Did the patient bring in their glucometer from home?  No telephone visit How often do you monitor your CBG's? no.   Financial Strains and Diabetes Management:  Are you having any financial strains with the device, your supplies or your medication? No .  Does the patient want to be seen by Chronic Care Management for management of their diabetes?  No  Would the patient like to be referred to a Nutritionist or for Diabetic Management?  No   Diabetic Exams:  Diabetic  Eye Exam: Completed 10/03/2019 Diabetic Foot Exam: Overdue, Pt has been advised about the importance in completing this exam. Pt is scheduled for diabetic foot exam on 08/02/2020.   Interpreter Needed?: No  Information entered by :: CJohnson, LPN   Activities of Daily Living In your present state of health, do you have any difficulty performing the following activities: 07/24/2020  Hearing? N  Vision? N  Difficulty concentrating or making decisions? N  Walking or climbing stairs? N  Dressing or bathing? N  Doing errands, shopping? N  Preparing Food and eating ? N  Using the Toilet? N  In the past six months, have you accidently leaked urine? N  Do you have problems with loss of bowel control? N  Managing your Medications? N  Managing your Finances? N  Housekeeping or managing your Housekeeping? N  Some recent data might be hidden    Patient Care Team: Owens Loffler, MD as PCP - General Oneta Rack, MD as Consulting Physician (Dermatology) [name], [facility] as Pharmacist (Pharmacist)  Indicate any recent Medical Services you may have received from other than [facility] providers in the past year  (date may be approximate).     Assessment:   This is a routine wellness examination for Amiri.  Hearing/Vision screen  Hearing Screening   125Hz  250Hz  500Hz  1000Hz  2000Hz  3000Hz  4000Hz  6000Hz  8000Hz   Right ear:           Left ear:           Vision Screening Comments: Patient gets annual eye exams   Dietary issues and exercise activities discussed: Current Exercise Habits: Home exercise routine, Type of exercise: treadmill, Time (Minutes): 45, Frequency (Times/Week): 3, Weekly Exercise (Minutes/Week): 135, Intensity: Moderate, Exercise limited by: None identified  Goals    . Chronic Care Management     CARE PLAN ENTRY  Current Barriers:  . Chronic Disease Management support, education, and care coordination needs related to Hypertension, Hyperlipidemia, Diabetes, GERD, and Osteoporosis   Hypertension BP Readings from Last 3 Encounters:  07/20/19 110/60  04/26/19 (!) 145/84  02/24/19 (!) 144/100 .  Pharmacist Clinical Goal(s): o Over the next 3 months, patient will work with PharmD and providers to maintain BP goal <140/90 mmHg . Current regimen:  . Amlodipine 5 mg -  1 tablet daily . HCTZ 25 mg - 1 tablet daily . Interventions: o Recommend low sodium diet and exercising 150 minutes per week o Recommend patient schedule a visit with Dr. Lorelei Pont for updated labs and routine visit.  o Reviewed home blood pressure readings.  . Patient self care activities - Over the next 3 months, patient will: o Check blood pressure at home for 5-7 days prior to next appointment, morning before breakfast and evening before supper o Ensure daily salt intake < 2300 mg/day o Take medications daily   Hyperlipidemia Lab Results  Component Value Date/Time   LDLCALC 103 (H) 07/18/2019 09:25 AM   LDLDIRECT 162.6 05/29/2010 08:29 AM .  Pharmacist Clinical Goal(s): o Over the next 3 months, patient will work with PharmD and providers to achieve LDL goal < 100 . Current regimen:  o Atorvastatin 10  mg - 1 tablet daily . Interventions: o Recommend continuing exercise and weight loss efforts.  o Recommend scheduling visit with Dr. Lorelei Pont.  . Patient self care activities - Over the next 3 months, patient will: . Achieve exercise goal of 30 minutes, 5 days per week . Incorporate a healthy diet high in vegetables, fruits and whole  grains with low-fat dairy products, chicken, fish, legumes, non-tropical vegetable oils and nuts. Limit intake of sweets, sugar-sweetened beverages and red meats.  Diabetes Lab Results  Component Value Date/Time   HGBA1C 7.2 (H) 07/18/2019 09:25 AM   HGBA1C 6.5 (A) 06/23/2018 01:00 PM .  Pharmacist Clinical Goal(s): o Over the next 3 months, patient will work with PharmD and providers to achieve A1c goal <7% . Current regimen:  o No pharmacotherapy . Interventions: o Recommend updating A1c in the next 2-4 weeks; past due for 6 month follow up with Dr. Lorelei Pont  o Recommend diabetes plate method in addition to exercise and weight loss . Patient self care activities - Over the next 3 months, patient will: o Call office to schedule follow up with Dr. Lorelei Pont and lab appointment at your soonest convenience  o Contact provider with any symptoms of hypoglycemia o Review the diabetes plate method and carbohydrate counting  Osteopenia . Pharmacist Clinical Goal(s) o Over the next 3 months, patient will work with PharmD and providers to improve bone density . Current regimen:  o Vitamin D 1000 units - 1 tablet daily . Interventions: o Recommend 1200 mg of calcium daily from dietary sources . Patient self care activities - Over the next 3 months, patient will: o Review calcium intake and add supplement if necessary  GERD . Pharmacist Clinical Goal(s) o Over the next 3 months, patient will work with PharmD and providers to reduce unnecessary medications . Current regimen:  o Esomeprazole 20 mg - 1 capsule daily (over the counter) . Interventions: o Recommend  attempt to reduce dose and monitor symptoms  Patient self care activities - Over the next 3 months, patient will: o Try reducing Nexium to every other day for 3-4 weeks, then switch to as needed Pepcid. If symptoms return > 2 days/week, you may need to resume daily Nexium.  Medication management . Pharmacist Clinical Goal(s): o Over the next 3 months, patient will work with PharmD and providers to achieve optimal medication adherence . Current pharmacy: [facility] . Interventions o Comprehensive medication review performed. o Utilize [facility] pharmacy for medication synchronization, packaging and delivery . Patient self care activities: o Utilize pill packaging for daily medications o Contact Michelle with any medication changes or concerns  Please see past updates related to this goal by clicking on the "Past Updates" button in the selected goal     . Increase physical activity     Starting 06/17/2018, I will continue to exercise at least 60 min 3 days per week and to walk 20 minutes daily.     . Patient Stated     07/19/2019, I will try to lose some weight and start back exercising.     . Patient Stated     07/24/2020, I will continue to go to the gym 3 days a week for about 45 minutes.       Depression Screen PHQ 2/9 Scores 07/24/2020 07/19/2019 06/23/2018 06/17/2018 05/18/2017 07/15/2016 05/09/2016  PHQ - 2 Score 0 0 0 0 0 0 0  PHQ- 9 Score 0 0 - 0 0 - -    Fall Risk Fall Risk  07/24/2020 07/19/2019 06/17/2018 05/18/2017 07/15/2016  Falls in the past year? 0 0 0 No No  Number falls in past yr: 0 0 - - -  Injury with Fall? 0 0 - - -  Risk for fall due to : Medication side effect Medication side effect - - -  Follow up Falls evaluation completed;Falls  prevention discussed Falls evaluation completed;Falls prevention discussed - - -    FALL RISK PREVENTION PERTAINING TO THE HOME:  Any stairs in or around the home? Yes  If so, are there any without handrails? No   Home free of loose throw rugs in walkways, pet beds, electrical cords, etc? Yes  Adequate lighting in your home to reduce risk of falls? Yes   ASSISTIVE DEVICES UTILIZED TO PREVENT FALLS:  Life alert? No  Use of a cane, walker or w/c? No  Grab bars in the bathroom? No  Shower chair or bench in shower? No  Elevated toilet seat or a handicapped toilet? No   TIMED UP AND GO:  Was the test performed? N/A, telephone visit .   Cognitive Function: MMSE - Mini Mental State Exam 07/24/2020 07/19/2019 06/17/2018 05/18/2017 05/09/2016  Orientation to time 5 5 5 5 5   Orientation to Place 5 5 5 5 5   Registration 3 3 3 3 3   Attention/ Calculation 5 5 0 0 0  Recall 3 3 3 3 3   Language- name 2 objects - - 0 0 0  Language- repeat 1 1 1 1 1   Language- follow 3 step command - - 3 3 3   Language- read & follow direction - - 0 0 0  Write a sentence - - 0 0 0  Copy design - - 0 0 0  Total score - - 20 20 20   Mini Cog  Mini-Cog screen was completed. Maximum score is 22. A value of 0 denotes this part of the MMSE was not completed or the patient failed this part of the Mini-Cog screening.       Immunizations Immunization History  Administered Date(s) Administered  . Fluad Quad(high Dose 65+) 04/05/2019  . Influenza Split 06/09/2011  . Influenza Whole 06/04/2008, 06/12/2009, 06/05/2010  . Influenza, High Dose Seasonal PF 05/07/2015  . Influenza,inj,Quad PF,6+ Mos 07/05/2013, 05/10/2014, 05/09/2016, 05/18/2017, 05/20/2018  . Influenza-Unspecified 05/12/2020  . PFIZER SARS-COV-2 Vaccination 09/16/2019, 10/07/2019, 05/09/2020  . Pneumococcal Conjugate-13 03/02/2014  . Pneumococcal Polysaccharide-23 05/09/2016  . Tdap 08/23/2012  . Zoster 07/11/2008    TDAP status: Up to date  Flu Vaccine status: Up to date  Pneumococcal vaccine status: Up to date  Covid-19 vaccine status: Completed vaccines  Qualifies for Shingles Vaccine? Yes   Zostavax completed Yes   Shingrix Completed?: No.     Education has been provided regarding the importance of this vaccine. Patient has been advised to call insurance company to determine out of pocket expense if they have not yet received this vaccine. Advised may also receive vaccine at local pharmacy or Health Dept. Verbalized acceptance and understanding.  Screening Tests Health Maintenance  Topic Date Due  . FOOT EXAM  Never done  . OPHTHALMOLOGY EXAM  10/02/2020  . HEMOGLOBIN A1C  01/18/2021  . URINE MICROALBUMIN  07/20/2021  . MAMMOGRAM  07/11/2022  . TETANUS/TDAP  08/23/2022  . COLONOSCOPY  04/25/2029  . INFLUENZA VACCINE  Completed  . DEXA SCAN  Completed  . COVID-19 Vaccine  Completed  . Hepatitis C Screening  Completed  . PNA vac Low Risk Adult  Completed    Health Maintenance  Health Maintenance Due  Topic Date Due  . FOOT EXAM  Never done    Colorectal cancer screening: Type of screening: Colonoscopy. Completed 04/26/2019. Repeat every 10 years  Mammogram status: Completed 07/11/2020. Repeat every year  Bone Density status: due, will discuss with provider at physical   Lung Cancer Screening: (Low  Dose CT Chest recommended if Age 67-80 years, 30 pack-year currently smoking OR have quit w/in 15years.) does not qualify.   Additional Screening:  Hepatitis C Screening: does qualify; Completed 05/09/2016  Vision Screening: Recommended annual ophthalmology exams for early detection of glaucoma and other disorders of the eye. Is the patient up to date with their annual eye exam?  Yes  Who is the provider or what is the name of the office in which the patient attends annual eye exams? [facility]  If pt is not established with a provider, would they like to be referred to a provider to establish care? No .   Dental Screening: Recommended annual dental exams for proper oral hygiene  Community Resource Referral / Chronic Care Management: CRR required this visit?  No   CCM required this visit?  No      Plan:      I have personally reviewed and noted the following in the patient's chart:   . Medical and social history . Use of alcohol, tobacco or illicit drugs  . Current medications and supplements . Functional ability and status . Nutritional status . Physical activity . Advanced directives . List of other physicians . Hospitalizations, surgeries, and ER visits in previous 12 months . Vitals . Screenings to include cognitive, depression, and falls . Referrals and appointments  In addition, I have reviewed and discussed with patient certain preventive protocols, quality metrics, and best practice recommendations. A written personalized care plan for preventive services as well as general preventive health recommendations were provided to patient.   Due to this being a telephonic visit, the after visit summary with patients personalized plan was offered to patient via office or my-chart.  Patient preferred to pick up at office at next visit or via mychart.   Andrez Grime, LPN   [id number]

## 2020-07-24 NOTE — Progress Notes (Signed)
PCP notes:  Health Maintenance: Dexa- due Foot exam- due   Abnormal Screenings: none   Patient concerns: Left knee pain onset 1 month ago    Nurse concerns: none   Next PCP appt.: 08/02/2020 @ 9 am

## 2020-07-24 NOTE — Patient Instructions (Signed)
Dana Hayes , Thank you for taking time to come for your Medicare Wellness Visit. I appreciate your ongoing commitment to your health goals. Please review the following plan we discussed and let me know if I can assist you in the future.   Screening recommendations/referrals: Colonoscopy: Up to date, completed 04/26/2019, due 04/2029 Mammogram: Up to date, completed 07/11/2020, due 07/2021 Bone Density: due, will discuss wit provider at visit  Recommended yearly ophthalmology/optometry visit for glaucoma screening and checkup Recommended yearly dental visit for hygiene and checkup  Vaccinations: Influenza vaccine: Up to date, completed 05/12/2020, due 03/2021 Pneumococcal vaccine: Completed series Tdap vaccine: Up to date, completed 08/23/2012, due 08/2022 Shingles vaccine: due, check with your insurance regarding coverage    Covid-19:Completed series  Advanced directives: Please bring a copy of your POA (Power of Attorney) and/or Living Will to your next appointment.   Conditions/risks identified: diabetes, hypertension, hyperlipidemia   Next appointment: Follow up in one year for your annual wellness visit    Preventive Care 73 Years and Older, Female Preventive care refers to lifestyle choices and visits with your health care provider that can promote health and wellness. What does preventive care include?  A yearly physical exam. This is also called an annual well check.  Dental exams once or twice a year.  Routine eye exams. Ask your health care provider how often you should have your eyes checked.  Personal lifestyle choices, including:  Daily care of your teeth and gums.  Regular physical activity.  Eating a healthy diet.  Avoiding tobacco and drug use.  Limiting alcohol use.  Practicing safe sex.  Taking low-dose aspirin every day.  Taking vitamin and mineral supplements as recommended by your health care provider. What happens during an annual well check? The  services and screenings done by your health care provider during your annual well check will depend on your age, overall health, lifestyle risk factors, and family history of disease. Counseling  Your health care provider may ask you questions about your:  Alcohol use.  Tobacco use.  Drug use.  Emotional well-being.  Home and relationship well-being.  Sexual activity.  Eating habits.  History of falls.  Memory and ability to understand (cognition).  Work and work Statistician.  Reproductive health. Screening  You may have the following tests or measurements:  Height, weight, and BMI.  Blood pressure.  Lipid and cholesterol levels. These may be checked every 5 years, or more frequently if you are over 43 years old.  Skin check.  Lung cancer screening. You may have this screening every year starting at age 64 if you have a 30-pack-year history of smoking and currently smoke or have quit within the past 15 years.  Fecal occult blood test (FOBT) of the stool. You may have this test every year starting at age 52.  Flexible sigmoidoscopy or colonoscopy. You may have a sigmoidoscopy every 5 years or a colonoscopy every 10 years starting at age 39.  Hepatitis C blood test.  Hepatitis B blood test.  Sexually transmitted disease (STD) testing.  Diabetes screening. This is done by checking your blood sugar (glucose) after you have not eaten for a while (fasting). You may have this done every 1-3 years.  Bone density scan. This is done to screen for osteoporosis. You may have this done starting at age 66.  Mammogram. This may be done every 1-2 years. Talk to your health care provider about how often you should have regular mammograms. Talk with your health  care provider about your test results, treatment options, and if necessary, the need for more tests. Vaccines  Your health care provider may recommend certain vaccines, such as:  Influenza vaccine. This is recommended  every year.  Tetanus, diphtheria, and acellular pertussis (Tdap, Td) vaccine. You may need a Td booster every 10 years.  Zoster vaccine. You may need this after age 1.  Pneumococcal 13-valent conjugate (PCV13) vaccine. One dose is recommended after age 48.  Pneumococcal polysaccharide (PPSV23) vaccine. One dose is recommended after age 21. Talk to your health care provider about which screenings and vaccines you need and how often you need them. This information is not intended to replace advice given to you by your health care provider. Make sure you discuss any questions you have with your health care provider. Document Released: 08/17/2015 Document Revised: 04/09/2016 Document Reviewed: 05/22/2015 Elsevier Interactive Patient Education  2017 [place] Prevention in the Home Falls can cause injuries. They can happen to people of all ages. There are many things you can do to make your home safe and to help prevent falls. What can I do on the outside of my home?  Regularly fix the edges of walkways and driveways and fix any cracks.  Remove anything that might make you trip as you walk through a door, such as a raised step or threshold.  Trim any bushes or trees on the path to your home.  Use bright outdoor lighting.  Clear any walking paths of anything that might make someone trip, such as rocks or tools.  Regularly check to see if handrails are loose or broken. Make sure that both sides of any steps have handrails.  Any raised decks and porches should have guardrails on the edges.  Have any leaves, snow, or ice cleared regularly.  Use sand or salt on walking paths during winter.  Clean up any spills in your garage right away. This includes oil or grease spills. What can I do in the bathroom?  Use night lights.  Install grab bars by the toilet and in the tub and shower. Do not use towel bars as grab bars.  Use non-skid mats or decals in the tub or shower.  If  you need to sit down in the shower, use a plastic, non-slip stool.  Keep the floor dry. Clean up any water that spills on the floor as soon as it happens.  Remove soap buildup in the tub or shower regularly.  Attach bath mats securely with double-sided non-slip rug tape.  Do not have throw rugs and other things on the floor that can make you trip. What can I do in the bedroom?  Use night lights.  Make sure that you have a light by your bed that is easy to reach.  Do not use any sheets or blankets that are too big for your bed. They should not hang down onto the floor.  Have a firm chair that has side arms. You can use this for support while you get dressed.  Do not have throw rugs and other things on the floor that can make you trip. What can I do in the kitchen?  Clean up any spills right away.  Avoid walking on wet floors.  Keep items that you use a lot in easy-to-reach places.  If you need to reach something above you, use a strong step stool that has a grab bar.  Keep electrical cords out of the way.  Do not use floor  polish or wax that makes floors slippery. If you must use wax, use non-skid floor wax.  Do not have throw rugs and other things on the floor that can make you trip. What can I do with my stairs?  Do not leave any items on the stairs.  Make sure that there are handrails on both sides of the stairs and use them. Fix handrails that are broken or loose. Make sure that handrails are as long as the stairways.  Check any carpeting to make sure that it is firmly attached to the stairs. Fix any carpet that is loose or worn.  Avoid having throw rugs at the top or bottom of the stairs. If you do have throw rugs, attach them to the floor with carpet tape.  Make sure that you have a light switch at the top of the stairs and the bottom of the stairs. If you do not have them, ask someone to add them for you. What else can I do to help prevent falls?  Wear shoes  that:  Do not have high heels.  Have rubber bottoms.  Are comfortable and fit you well.  Are closed at the toe. Do not wear sandals.  If you use a stepladder:  Make sure that it is fully opened. Do not climb a closed stepladder.  Make sure that both sides of the stepladder are locked into place.  Ask someone to hold it for you, if possible.  Clearly mark and make sure that you can see:  Any grab bars or handrails.  First and last steps.  Where the edge of each step is.  Use tools that help you move around (mobility aids) if they are needed. These include:  Canes.  Walkers.  Scooters.  Crutches.  Turn on the lights when you go into a dark area. Replace any light bulbs as soon as they burn out.  Set up your furniture so you have a clear path. Avoid moving your furniture around.  If any of your floors are uneven, fix them.  If there are any pets around you, be aware of where they are.  Review your medicines with your doctor. Some medicines can make you feel dizzy. This can increase your chance of falling. Ask your doctor what other things that you can do to help prevent falls. This information is not intended to replace advice given to you by your health care provider. Make sure you discuss any questions you have with your health care provider. Document Released: 05/17/2009 Document Revised: 12/27/2015 Document Reviewed: 08/25/2014 Elsevier Interactive Patient Education  2017 [employer].

## 2020-07-25 ENCOUNTER — Other Ambulatory Visit: Payer: Self-pay | Admitting: Family Medicine

## 2020-07-31 ENCOUNTER — Telehealth: Payer: Self-pay

## 2020-07-31 NOTE — Telephone Encounter (Signed)
Pt left v/m requesting cb about questions she has before 08/02/20 CPX appt. I called pt and she said she had already spoken with nurse at [facility] and nothing needed at this time.

## 2020-08-02 ENCOUNTER — Encounter: Payer: Self-pay | Admitting: Family Medicine

## 2020-08-02 ENCOUNTER — Ambulatory Visit (INDEPENDENT_AMBULATORY_CARE_PROVIDER_SITE_OTHER): Payer: PPO | Admitting: Family Medicine

## 2020-08-02 ENCOUNTER — Other Ambulatory Visit: Payer: Self-pay

## 2020-08-02 VITALS — BP 140/70 | HR 82 | Temp 98.0°F | Ht 61.0 in | Wt 153.5 lb

## 2020-08-02 DIAGNOSIS — Z0000 Encounter for general adult medical examination without abnormal findings: Secondary | ICD-10-CM

## 2020-08-02 MED ORDER — FINASTERIDE 1 MG PO TABS: 1.0000 mg | ORAL_TABLET | Freq: Every day | ORAL | 3 refills | Status: DC

## 2020-08-02 NOTE — Progress Notes (Signed)
Veryl Abril T. Christelle Igoe, MD, CAQ Sports Medicine  Primary Care and Sports Medicine [facility] [address] [place], [address]  Phone: [phone]  FAX: [phone]  Dana Hayes - 74 y.o. female  MRN [id number]  Date of Birth: 27-Mar-1946  Date: 08/02/2020  PCP: Hannah Beat, MD  Referral: Hannah Beat, MD  Chief Complaint  Patient presents with  . Annual Exam    Part 2    This visit occurred during the SARS-CoV-2 public health emergency.  Safety protocols were in place, including screening questions prior to the visit, additional usage of staff PPE, and extensive cleaning of exam room while observing appropriate contact time as indicated for disinfecting solutions.   Patient Care Team: Hannah Beat, MD as PCP - General Debbrah Alar, MD as Consulting Physician (Dermatology) Phil Dopp, [facility] as Pharmacist (Pharmacist) Subjective:   Dana Hayes is a 74 y.o. pleasant patient who presents with the following:  Health Maintenance Summary Reviewed and updated, unless pt declines services.  Tobacco History Reviewed. Non-smoker Alcohol: No concerns, no excessive use Exercise Habits: Some activity, rec at least 30 mins 5 times a week STD concerns: none Drug Use: None Lumps or breast concerns: no  No ETOH with minimal  She does have diabetes, but she is normalized this with lifestyle changes alone Lab Results  Component Value Date   HGBA1C 6.2 07/20/2020     Health Maintenance  Topic Date Due  . FOOT EXAM  Never done  . OPHTHALMOLOGY EXAM  10/02/2020  . HEMOGLOBIN A1C  01/18/2021  . URINE MICROALBUMIN  07/20/2021  . MAMMOGRAM  07/11/2022  . TETANUS/TDAP  08/23/2022  . COLONOSCOPY (Pts 45-66yrs Insurance coverage will need to be confirmed)  04/25/2029  . INFLUENZA VACCINE  Completed  . DEXA SCAN  Completed  . COVID-19 Vaccine  Completed  . Hepatitis C Screening  Completed  . PNA vac Low Risk Adult   Completed    Immunization History  Administered Date(s) Administered  . Fluad Quad(high Dose 65+) 04/05/2019  . Influenza Split 06/09/2011  . Influenza Whole 06/04/2008, 06/12/2009, 06/05/2010  . Influenza, High Dose Seasonal PF 05/07/2015, 05/12/2020  . Influenza,inj,Quad PF,6+ Mos 07/05/2013, 05/10/2014, 05/09/2016, 05/18/2017, 05/20/2018  . PFIZER SARS-COV-2 Vaccination 09/16/2019, 10/07/2019, 05/09/2020  . Pneumococcal Conjugate-13 03/02/2014  . Pneumococcal Polysaccharide-23 05/09/2016  . Tdap 08/23/2012  . Zoster 07/11/2008   Patient Active Problem List   Diagnosis Date Noted  . Osteopenia 02/24/2019  . Diabetes mellitus type 2, diet-controlled (HCC) 06/23/2018  . DIVERTICULOSIS OF COLON 12/28/2007  . Hyperlipidemia 10/21/2007  . Essential hypertension 10/21/2007  . ALLERGIC RHINITIS 10/21/2007  . Mild persistent asthma with acute exacerbation in adult 10/21/2007  . GERD 10/21/2007  . NEPHROLITHIASIS, HX OF 10/21/2007    Past Medical History:  Diagnosis Date  . Allergy   . Asthma   . GERD (gastroesophageal reflux disease)   . History of kidney stones   . Hyperlipidemia   . Hypertension   . Obesity     Past Surgical History:  Procedure Laterality Date  . ABDOMINAL HYSTERECTOMY    . COLONOSCOPY WITH PROPOFOL N/A 04/26/2019   Procedure: COLONOSCOPY WITH PROPOFOL;  Surgeon: Wyline Mood, MD;  Location: [facility] ENDOSCOPY;  Service: Gastroenterology;  Laterality: N/A;  . LITHOTRIPSY      TIMES 2 1990    Family History  Problem Relation Age of Onset  . Coronary artery disease Mother   . Diabetes Mother   . Coronary  artery disease Brother   . Heart attack Brother   . Breast cancer Maternal Aunt        early 21s    Past Medical History, Surgical History, Social History, Family History, Problem List, Medications, and Allergies have been reviewed and updated if relevant.  Review of Systems: Pertinent positives are listed above.  Otherwise, a full 14 point review of  systems has been done in full and it is negative except where it is noted positive.  Objective:   BP 140/70   Pulse 82   Temp 98 F (36.7 C) (Temporal)   Ht 5\' 1"  (1.549 m)   Wt 153 lb 8 oz (69.6 kg)   SpO2 97%   BMI 29.00 kg/m  Ideal Body Weight: Weight in (lb) to have BMI = 25: 132 No exam data present Depression screen [facility] 2/9 07/24/2020 07/19/2019 06/23/2018 06/17/2018 05/18/2017  Decreased Interest 0 0 0 0 0  Down, Depressed, Hopeless 0 0 0 0 0  PHQ - 2 Score 0 0 0 0 0  Altered sleeping 0 0 - 0 0  Tired, decreased energy 0 0 - 0 0  Change in appetite 0 0 - 0 0  Feeling bad or failure about yourself  0 0 - 0 0  Trouble concentrating 0 0 - 0 0  Moving slowly or fidgety/restless 0 0 - 0 0  Suicidal thoughts 0 0 - 0 0  PHQ-9 Score 0 0 - 0 0  Difficult doing work/chores Not difficult at all Not difficult at all - Not difficult at all Not difficult at all     GEN: well developed, well nourished, no acute distress Eyes: conjunctiva and lids normal, PERRLA, EOMI ENT: TM clear, nares clear, oral exam WNL Neck: supple, no lymphadenopathy, no thyromegaly, no JVD Pulm: clear to auscultation and percussion, respiratory effort normal CV: regular rate and rhythm, S1-S2, no murmur, rub or gallop, no bruits Chest: no scars, masses, no lumps BREAST: breast exam declined GI: soft, non-tender; no hepatosplenomegaly, masses; active bowel sounds all quadrants GU: GU exam declined Lymph: no cervical, axillary or inguinal adenopathy MSK: gait normal, muscle tone and strength WNL, no joint swelling, effusions, discoloration, crepitus  SKIN: clear, good turgor, color WNL, no rashes, lesions, or ulcerations Neuro: normal mental status, normal strength, sensation, and motion Psych: alert; oriented to person, place and time, normally interactive and not anxious or depressed in appearance.   All labs reviewed with patient. Results for orders placed or performed in visit on 07/20/20  CBC with  Differential/Platelet  Result Value Ref Range   WBC 8.3 4.0 - 10.5 K/uL   RBC 5.42 (H) 3.87 - 5.11 Mil/uL   Hemoglobin 15.1 (H) 12.0 - 15.0 g/dL   HCT 07/22/20 92.3 - 30.0 %   MCV 82.4 78.0 - 100.0 fl   MCHC 33.7 30.0 - 36.0 g/dL   RDW 76.2 26.3 - 33.5 %   Platelets 341.0 150.0 - 400.0 K/uL   Neutrophils Relative % 45.4 43.0 - 77.0 %   Lymphocytes Relative 39.1 12.0 - 46.0 %   Monocytes Relative 10.1 3.0 - 12.0 %   Eosinophils Relative 4.0 0.0 - 5.0 %   Basophils Relative 1.4 0.0 - 3.0 %   Neutro Abs 3.8 1.4 - 7.7 K/uL   Lymphs Abs 3.2 0.7 - 4.0 K/uL   Monocytes Absolute 0.8 0.1 - 1.0 K/uL   Eosinophils Absolute 0.3 0.0 - 0.7 K/uL   Basophils Absolute 0.1 0.0 - 0.1 K/uL  VITAMIN  D 25 Hydroxy (Vit-D Deficiency, Fractures)  Result Value Ref Range   VITD 71.87 30.00 - 100.00 ng/mL  Microalbumin / creatinine urine ratio  Result Value Ref Range   Microalb, Ur <0.7 0.0 - 1.9 mg/dL   Creatinine,U 25.0 mg/dL   Microalb Creat Ratio 2.8 0.0 - 30.0 mg/g  Hemoglobin A1c  Result Value Ref Range   Hgb A1c MFr Bld 6.2 4.6 - 6.5 %  Basic metabolic panel  Result Value Ref Range   Sodium 136 135 - 145 mEq/L   Potassium 3.6 3.5 - 5.1 mEq/L   Chloride 101 96 - 112 mEq/L   CO2 27 19 - 32 mEq/L   Glucose, Bld 114 (H) 70 - 99 mg/dL   BUN 21 6 - 23 mg/dL   Creatinine, Ser 0.70 0.40 - 1.20 mg/dL   GFR 86.30 >60.00 mL/min   Calcium 9.0 8.4 - 10.5 mg/dL  Hepatic function panel  Result Value Ref Range   Total Bilirubin 0.4 0.2 - 1.2 mg/dL   Bilirubin, Direct 0.1 0.0 - 0.3 mg/dL   Alkaline Phosphatase 79 39 - 117 U/L   AST 17 0 - 37 U/L   ALT 21 0 - 35 U/L   Total Protein 6.7 6.0 - 8.3 g/dL   Albumin 4.3 3.5 - 5.2 g/dL  Lipid panel  Result Value Ref Range   Cholesterol 164 0 - 200 mg/dL   Triglycerides 113.0 0.0 - 149.0 mg/dL   HDL 58.10 >39.00 mg/dL   VLDL 22.6 0.0 - 40.0 mg/dL   LDL Cholesterol 83 0 - 99 mg/dL   Total CHOL/HDL Ratio 3    NonHDL 105.72    MM 3D SCREEN BREAST  BILATERAL  Result Date: 07/13/2020 CLINICAL DATA:  Screening. EXAM: DIGITAL SCREENING BILATERAL MAMMOGRAM WITH TOMO AND CAD COMPARISON:  Previous exam(s). ACR Breast Density Category b: There are scattered areas of fibroglandular density. FINDINGS: There are no findings suspicious for malignancy. Images were processed with CAD. IMPRESSION: No mammographic evidence of malignancy. A result letter of this screening mammogram will be mailed directly to the patient. RECOMMENDATION: Screening mammogram in one year. (Code:SM-B-01Y) BI-RADS CATEGORY  1: Negative. Electronically Signed   By: Claudie Revering M.D.   On: 07/13/2020 16:44    Assessment and Plan:     ICD-10-CM   1. Healthcare maintenance  Z00.00    She is really doing well.  I congratulated her on her lifestyle choices, weight loss, and control of her diabetes.  Health Maintenance Exam: The patient's preventative maintenance and recommended screening tests for an annual wellness exam were reviewed in full today. Brought up to date unless services declined.  Counselled on the importance of diet, exercise, and its role in overall health and mortality. The patient's FH and SH was reviewed, including their home life, tobacco status, and drug and alcohol status.  Follow-up in 1 year for physical exam or additional follow-up below.  Follow-up: No follow-ups on file. Or follow-up in 1 year if not noted.  Future Appointments  Date Time Provider [place]  08/22/2020  8:30 AM [facility] CCM PHARMACIST [facility]  07/26/2021 10:30 AM [facility] NURSE HEALTH ADVISOR [facility]  07/31/2021  8:00 AM [facility] LAB [facility]  08/07/2021  9:00 AM Krysia Zahradnik, Frederico Hamman, MD [facility]    Meds ordered this encounter  Medications  . finasteride (PROPECIA) 1 MG tablet    Sig: Take 1 tablet (1 mg total) by mouth daily.    Dispense:  90 tablet  Refill:  3   Medications Discontinued During This Encounter  Medication Reason  . aspirin 81 MG  tablet Completed Course  . esomeprazole (NEXIUM) 20 MG capsule Duplicate  . finasteride (PROPECIA) 1 MG tablet Reorder   No orders of the defined types were placed in this encounter.   Signed,  Elpidio Galea. Dallon Dacosta, MD   Allergies as of 08/02/2020      Reactions   Codeine    REACTION: Nausea and vomiting   Ace Inhibitors Cough   Estradiol Rash   REACTION: rash at site of administration, topical rash only      Medication List       Accurate as of August 02, 2020  2:39 PM. If you have any questions, ask your nurse or doctor.        STOP taking these medications   aspirin 81 MG tablet Stopped by: Hannah Beat, MD     TAKE these medications   albuterol 108 (90 Base) MCG/ACT inhaler Commonly known as: VENTOLIN HFA Inhale 2 puffs into the lungs every 6 (six) hours as needed for wheezing or shortness of breath.   amLODipine 5 MG tablet Commonly known as: NORVASC Take 1 tablet (5 mg total) by mouth daily.   atorvastatin 10 MG tablet Commonly known as: LIPITOR Take 1 tablet (10 mg total) by mouth daily.   cetirizine 10 MG tablet Commonly known as: ZYRTEC Take 10 mg daily by mouth.   cholecalciferol 1000 units tablet Commonly known as: VITAMIN D Take 1,000 Units by mouth daily.   esomeprazole 40 MG capsule Commonly known as: NEXIUM Take 40 mg by mouth daily as needed.   finasteride 1 MG tablet Commonly known as: PROPECIA Take 1 tablet (1 mg total) by mouth daily.   hydrochlorothiazide 25 MG tablet Commonly known as: HYDRODIURIL Take 1 tablet (25 mg total) by mouth daily.   ibuprofen 800 MG tablet Commonly known as: ADVIL TAKE ONE TABLET BY MOUTH EVERY 8 HOURS AS NEEDED   multivitamin tablet Take 1 tablet by mouth daily.   PROBIOTIC PO Take 1 tablet by mouth daily.   VITAMIN E PO Take 1 tablet by mouth daily.

## 2020-08-22 ENCOUNTER — Telehealth: Payer: PPO

## 2020-08-22 ENCOUNTER — Telehealth: Payer: Self-pay

## 2020-08-22 NOTE — Chronic Care Management (AMB) (Incomplete Revision)
Chronic Care Management Pharmacy Assistant   Name: Dana Hayes  MRN: [id number] DOB: 10/26/1945  Reason for Encounter: Disease State and Medication Review- Med sync / Hypertension   Patient Questions:  1.  Have you seen any other providers since your last visit? Yes 08/02/20- [place]- PCP  2.  Any changes in your medicines or health? Yes 08/02/20 Dr. Lorelei Pont discontinued Aspirin   PCP : Owens Loffler, MD  Allergies:   Allergies  Allergen Reactions   Codeine     REACTION: Nausea and vomiting   Ace Inhibitors Cough   Estradiol Rash    REACTION: rash at site of administration, topical rash only    Medications: Outpatient Encounter Medications as of 08/22/2020  Medication Sig   albuterol (PROVENTIL HFA;VENTOLIN HFA) 108 (90 Base) MCG/ACT inhaler Inhale 2 puffs into the lungs every 6 (six) hours as needed for wheezing or shortness of breath.   amLODipine (NORVASC) 5 MG tablet Take 1 tablet (5 mg total) by mouth daily.   atorvastatin (LIPITOR) 10 MG tablet Take 1 tablet (10 mg total) by mouth daily.   cetirizine (ZYRTEC) 10 MG tablet Take 10 mg daily by mouth.   cholecalciferol (VITAMIN D) 1000 UNITS tablet Take 1,000 Units by mouth daily.   esomeprazole (NEXIUM) 40 MG capsule Take 40 mg by mouth daily as needed.   finasteride (PROPECIA) 1 MG tablet Take 1 tablet (1 mg total) by mouth daily.   hydrochlorothiazide (HYDRODIURIL) 25 MG tablet Take 1 tablet (25 mg total) by mouth daily.   ibuprofen (ADVIL) 800 MG tablet TAKE ONE TABLET BY MOUTH EVERY 8 HOURS AS NEEDED   Multiple Vitamin (MULTIVITAMIN) tablet Take 1 tablet by mouth daily.   Probiotic Product (PROBIOTIC PO) Take 1 tablet by mouth daily.   VITAMIN E PO Take 1 tablet by mouth daily.   Facility-Administered Encounter Medications as of 08/22/2020  Medication   albuterol (PROVENTIL) (2.5 MG/3ML) 0.083% nebulizer solution 2.5 mg    Current Diagnosis: Patient Active Problem List   Diagnosis Date Noted    Osteopenia 02/24/2019   Diabetes mellitus type 2, diet-controlled ([place]) 06/23/2018   DIVERTICULOSIS OF COLON 12/28/2007   Hyperlipidemia 10/21/2007   Essential hypertension 10/21/2007   ALLERGIC RHINITIS 10/21/2007   Mild persistent asthma with acute exacerbation in adult 10/21/2007   GERD 10/21/2007   NEPHROLITHIASIS, HX OF 10/21/2007   Reviewed chart for medication changes ahead of medication coordination call.  OVs include Dr. Lorelei Pont 08/02/20. No consults, or hospital visits since last care coordination call/Pharmacist visit.   Medication changes include Dr. Lorelei Pont stopping Aspirin, finasteride 1 mg started   BP Readings from Last 3 Encounters:  08/02/20 140/70  07/20/19 110/60  04/26/19 (!) 145/84    Lab Results  Component Value Date   HGBA1C 6.2 07/20/2020    Patient obtains medications through Adherence Packaging  90 Days   Last adherence delivery included:  07/26/20 90 DS, adherence packaging: Amlodipine 5 mg - 1 tablet daily(breakfast) Atorvastatin 10 mg - 1 tablet daily(breakfast) HCTZ 25 mg - 1 tablet daily(breakfast) 07/26/20 30 DS, vials: Ibuprofen 800 mg- 1 tablet every 8 hours as needed - VIALS 08/07/20  30 DS vials: Finasteride 1 mg - 1 tablet daily  Patient declined the following adherence medications last month: NONE  Patient is due for next adherence delivery on: 10/19/20.  Called patient and reviewed medications and coordinated delivery - patient will need finasteride synced to other medications.  This delivery to include:  Finasteride 1 mg  to be delivered 08/28/20, sync to other meds due 10/19/20   Patient declined the following medications:  Atorvastatin 10 mg - 1 tablet daily(breakfast)- due to getting 90 DS adherence delivery on 07/25/20 Amlodipine 5 mg - 1 tablet daily(breakfast) due to getting 90 DS adherence delivery on 07/25/20 HCTZ 25 mg - 1 tablet daily(breakfast)due to getting 90 DS adherence delivery on 07/25/20 Vials/PRN Ibuprofen 800  mg- 1 tablet by mouth every 8 hours as needed. due to getting 90 DS adherence delivery on 07/25/20  Patient needs refills for no mediations.  Confirmed delivery date of 08/28/20 for acute fill, advised patient that pharmacy will contact them the morning of delivery.  Reviewed chart prior to disease state call. Spoke with patient regarding BP  Recent Office Vitals: BP Readings from Last 3 Encounters:  08/02/20 140/70  07/20/19 110/60  04/26/19 (!) 145/84   Pulse Readings from Last 3 Encounters:  08/02/20 82  07/20/19 90  04/26/19 78    Wt Readings from Last 3 Encounters:  08/02/20 153 lb 8 oz (69.6 kg)  07/20/19 175 lb 8 oz (79.6 kg)  04/26/19 168 lb (76.2 kg)     Kidney Function Lab Results  Component Value Date/Time   CREATININE 0.70 07/20/2020 08:17 AM   CREATININE 0.68 07/18/2019 09:25 AM   GFR 86.30 07/20/2020 08:17 AM   GFRNONAA >60 06/15/2017 01:26 PM   GFRAA >60 06/15/2017 01:26 PM    BMP Latest Ref Rng & Units 07/20/2020 07/18/2019 06/17/2018  Glucose 70 - 99 mg/dL 114(H) 146(H) 130(H)  BUN 6 - 23 mg/dL 21 17 13   Creatinine 0.40 - 1.20 mg/dL 0.70 0.68 0.71  Sodium 135 - 145 mEq/L 136 138 141  Potassium 3.5 - 5.1 mEq/L 3.6 4.0 4.0  Chloride 96 - 112 mEq/L 101 102 104  CO2 19 - 32 mEq/L 27 26 29   Calcium 8.4 - 10.5 mg/dL 9.0 9.6 9.2    Current antihypertensive regimen:  Amlodipine 5 mg -  1 tablet daily HCTZ 25 mg - 1 tablet daily  How often are you checking your Blood Pressure? infrequently   Current home BP readings:  115/74 - 08/24/20  What recent interventions/DTPs have been made by any provider to improve Blood Pressure control since last CPP Visit:  Recommend low sodium diet and exercising 150 minutes per week Recommend patient schedule a visit with Dr. Lorelei Pont for updated labs and routine visit.   Any recent hospitalizations or ED visits since last visit with CPP? No   What diet changes have been made to improve Blood Pressure Control?  States  no diet changes for the better.   What exercise is being done to improve your Blood Pressure Control?  Patient goes to the gym several times a week, very active at home. Has been keeping great-grandchild this week.  Adherence Review: Is the patient currently on ACE/ARB medication? No  Does the patient have >5 day gap between last estimated fill dates? Refills timely  Follow-Up:  Pharmacist Review   [name], CPP notified  Margaretmary Dys, [place] Assistant [phone]  I have reviewed the care management and care coordination activities outlined in this encounter and I am certifying that I agree with the content of this note. No further action required.  [name], PharmD Clinical Pharmacist [facility] at [facility] [phone]

## 2020-08-22 NOTE — Chronic Care Management (AMB) (Addendum)
Chronic Care Management Pharmacy Assistant   Name: SULAY BRYMER  MRN: [id number] DOB: [birth year]  Reason for Encounter: Disease State and Medication Review- Med sync / Hypertension   Patient Questions:  1.  Have you seen any other providers since your last visit? Yes 08/02/20- [place]- PCP  2.  Any changes in your medicines or health? Yes 08/02/20 Dr. Lorelei Pont discontinued Aspirin, ordered finasteride 1 mg    PCP : Owens Loffler, MD  Allergies:   Allergies  Allergen Reactions   Codeine     REACTION: Nausea and vomiting   Ace Inhibitors Cough   Estradiol Rash    REACTION: rash at site of administration, topical rash only    Medications: Outpatient Encounter Medications as of 08/22/2020  Medication Sig   albuterol (PROVENTIL HFA;VENTOLIN HFA) 108 (90 Base) MCG/ACT inhaler Inhale 2 puffs into the lungs every 6 (six) hours as needed for wheezing or shortness of breath.   amLODipine (NORVASC) 5 MG tablet Take 1 tablet (5 mg total) by mouth daily.   atorvastatin (LIPITOR) 10 MG tablet Take 1 tablet (10 mg total) by mouth daily.   cetirizine (ZYRTEC) 10 MG tablet Take 10 mg daily by mouth.   cholecalciferol (VITAMIN D) 1000 UNITS tablet Take 1,000 Units by mouth daily.   esomeprazole (NEXIUM) 40 MG capsule Take 40 mg by mouth daily as needed.   finasteride (PROPECIA) 1 MG tablet Take 1 tablet (1 mg total) by mouth daily.   hydrochlorothiazide (HYDRODIURIL) 25 MG tablet Take 1 tablet (25 mg total) by mouth daily.   ibuprofen (ADVIL) 800 MG tablet TAKE ONE TABLET BY MOUTH EVERY 8 HOURS AS NEEDED   Multiple Vitamin (MULTIVITAMIN) tablet Take 1 tablet by mouth daily.   Probiotic Product (PROBIOTIC PO) Take 1 tablet by mouth daily.   VITAMIN E PO Take 1 tablet by mouth daily.   Facility-Administered Encounter Medications as of 08/22/2020  Medication   albuterol (PROVENTIL) (2.5 MG/3ML) 0.083% nebulizer solution 2.5 mg    Current Diagnosis: Patient Active Problem List    Diagnosis Date Noted   Osteopenia 02/24/2019   Diabetes mellitus type 2, diet-controlled ([place]) 06/23/2018   DIVERTICULOSIS OF COLON 12/28/2007   Hyperlipidemia 10/21/2007   Essential hypertension 10/21/2007   ALLERGIC RHINITIS 10/21/2007   Mild persistent asthma with acute exacerbation in adult 10/21/2007   GERD 10/21/2007   NEPHROLITHIASIS, HX OF 10/21/2007   Reviewed chart for medication changes ahead of medication coordination call.  OVs include Dr. Lorelei Pont 08/02/20. No consults, or hospital visits since last care coordination call/Pharmacist visit.   Medication changes include Dr. Lorelei Pont stopping Aspirin, finasteride 1 mg started   BP Readings from Last 3 Encounters:  08/02/20 140/70  07/20/19 110/60  04/26/19 (!) 145/84    Lab Results  Component Value Date   HGBA1C 6.2 07/20/2020    Patient obtains medications through Adherence Packaging  90 Days   Last adherence delivery included:  07/26/20 90 DS, adherence packaging: Amlodipine 5 mg - 1 tablet daily(breakfast) Atorvastatin 10 mg - 1 tablet daily(breakfast) HCTZ 25 mg - 1 tablet daily(breakfast) 07/26/20 30 DS, vials: Ibuprofen 800 mg- 1 tablet every 8 hours as needed - VIALS 08/07/20  30 DS vials: Finasteride 1 mg - 1 tablet daily  Patient declined the following adherence medications last month: NONE  Patient is due for next adherence delivery on: 10/19/20.  Called patient and reviewed medications and coordinated delivery - patient will need finasteride synced to other medications.  This delivery to  include:  Finasteride 1 mg to be delivered 09/05/20 and synced to other meds due 10/19/20   Patient declined the following medications:  Atorvastatin 10 mg - 1 tablet daily(breakfast)- due to getting 90 DS adherence delivery on 07/25/20 Amlodipine 5 mg - 1 tablet daily(breakfast) due to getting 90 DS adherence delivery on 07/25/20 HCTZ 25 mg - 1 tablet daily(breakfast)due to getting 90 DS adherence delivery on  07/25/20 Vials/PRN Ibuprofen 800 mg- 1 tablet by mouth every 8 hours as needed. due to getting 90 DS adherence delivery on 07/25/20  Patient needs refills for no mediations.  Confirmed delivery date of 09/05/20 for finasteride, advised patient that pharmacy will contact them the morning of delivery.  Reviewed chart prior to disease state call. Spoke with patient regarding BP  Recent Office Vitals: BP Readings from Last 3 Encounters:  08/02/20 140/70  07/20/19 110/60  04/26/19 (!) 145/84   Pulse Readings from Last 3 Encounters:  08/02/20 82  07/20/19 90  04/26/19 78    Wt Readings from Last 3 Encounters:  08/02/20 153 lb 8 oz (69.6 kg)  07/20/19 175 lb 8 oz (79.6 kg)  04/26/19 168 lb (76.2 kg)     Kidney Function Lab Results  Component Value Date/Time   CREATININE 0.70 07/20/2020 08:17 AM   CREATININE 0.68 07/18/2019 09:25 AM   GFR 86.30 07/20/2020 08:17 AM   GFRNONAA >60 06/15/2017 01:26 PM   GFRAA >60 06/15/2017 01:26 PM    BMP Latest Ref Rng & Units 07/20/2020 07/18/2019 06/17/2018  Glucose 70 - 99 mg/dL 114(H) 146(H) 130(H)  BUN 6 - 23 mg/dL 21 17 13   Creatinine 0.40 - 1.20 mg/dL 0.70 0.68 0.71  Sodium 135 - 145 mEq/L 136 138 141  Potassium 3.5 - 5.1 mEq/L 3.6 4.0 4.0  Chloride 96 - 112 mEq/L 101 102 104  CO2 19 - 32 mEq/L 27 26 29   Calcium 8.4 - 10.5 mg/dL 9.0 9.6 9.2    Current antihypertensive regimen:  Amlodipine 5 mg -  1 tablet daily HCTZ 25 mg - 1 tablet daily  How often are you checking your Blood Pressure? infrequently   Current home BP readings:  115/74 - 08/24/20  What recent interventions/DTPs have been made by any provider to improve Blood Pressure control since last CPP Visit:  Recommend low sodium diet and exercising 150 minutes per week Recommend patient schedule a visit with Dr. Lorelei Pont for updated labs and routine visit.   Any recent hospitalizations or ED visits since last visit with CPP? No   What diet changes have been made to  improve Blood Pressure Control?  States no diet changes for the better.   What exercise is being done to improve your Blood Pressure Control?  Patient goes to the gym several times a week, very active at home. Has been keeping great-grandchild this week.  Adherence Review: Is the patient currently on ACE/ARB medication? No  Does the patient have >5 day gap between last estimated fill dates? Refills timely for BP medications   Follow-Up:  Pharmacist Review   [name], CPP notified  Margaretmary Dys, [place] Pharmacy Assistant [phone]  I have reviewed the care management and care coordination activities outlined in this encounter and I am certifying that I agree with the content of this note. . Note - patient had not been getting finasteride prior to 08/07/20 (Although it seems she has been on this in the past? Unclear history). Next month, let's see how she is tolerating this, what she  takes it for, and if she has noticed any improvement.  [name], PharmD Clinical Pharmacist [facility] at [facility] [phone]

## 2020-10-18 ENCOUNTER — Telehealth: Payer: Self-pay

## 2020-10-18 NOTE — Progress Notes (Addendum)
Chronic Care Management Pharmacy Assistant   Name: Dana Hayes  MRN: [id number] DOB: 1946/06/30  Reason for Encounter: Medication Adherence and Delivery Coordination   Recent office visits:  No visits since last CCM contact  Recent consult visits:  No visits since last CCM contact  Hospital visits:  None in previous 6 months  Medications: Outpatient Encounter Medications as of 10/18/2020  Medication Sig   albuterol (PROVENTIL HFA;VENTOLIN HFA) 108 (90 Base) MCG/ACT inhaler Inhale 2 puffs into the lungs every 6 (six) hours as needed for wheezing or shortness of breath.   amLODipine (NORVASC) 5 MG tablet Take 1 tablet (5 mg total) by mouth daily.   atorvastatin (LIPITOR) 10 MG tablet Take 1 tablet (10 mg total) by mouth daily.   cetirizine (ZYRTEC) 10 MG tablet Take 10 mg daily by mouth.   cholecalciferol (VITAMIN D) 1000 UNITS tablet Take 1,000 Units by mouth daily.   esomeprazole (NEXIUM) 40 MG capsule Take 40 mg by mouth daily as needed.   finasteride (PROPECIA) 1 MG tablet Take 1 tablet (1 mg total) by mouth daily.   hydrochlorothiazide (HYDRODIURIL) 25 MG tablet Take 1 tablet (25 mg total) by mouth daily.   ibuprofen (ADVIL) 800 MG tablet TAKE ONE TABLET BY MOUTH EVERY 8 HOURS AS NEEDED   Multiple Vitamin (MULTIVITAMIN) tablet Take 1 tablet by mouth daily.   Probiotic Product (PROBIOTIC PO) Take 1 tablet by mouth daily.   VITAMIN E PO Take 1 tablet by mouth daily.   Facility-Administered Encounter Medications as of 10/18/2020  Medication   albuterol (PROVENTIL) (2.5 MG/3ML) 0.083% nebulizer solution 2.5 mg   Reviewed chart for medication changes ahead of medication coordination call.  No OVs, Consults, or hospital visits since last care coordination call/Pharmacist visit.  No medication changes indicated   BP Readings from Last 3 Encounters:  08/02/20 140/70  07/20/19 110/60  04/26/19 (!) 145/84    Lab Results  Component Value Date   HGBA1C 6.2 07/20/2020      Patient obtains medications through Adherence Packaging  90 Days   Last adherence delivery included: 07/25/20 90 DS packs Amlodipine 5 mg - 1 tablet daily(breakfast) Atorvastatin 10 mg - 1 tablet daily(breakfast) HCTZ 25 mg - 1 tablet daily(breakfast) VIALS 30 DS Ibuprofen 800 mg- 1 tablet every 8 hours as needed - VIALS Finasteride 1 mg - 1 tablet daily (short filled 09/03/20)  Patient declined the following medications last month: NONE   Patient is due for next adherence delivery on: 10/26/20. Called patient and reviewed medications and coordinated delivery.  This delivery to include: Amlodipine 5 mg - 1 tablet daily(breakfast) Atorvastatin 10 mg - 1 tablet daily(breakfast) Finasteride 1 mg - 1 tablet daily (breakfast) HCTZ 25 mg - 1 tablet daily(breakfast) Vials Ibuprofen 800 mg- 1 tablet every 8 hours as needed - VIALS  Patient declined the following medications None  Patient needs refills for the following medications from Dr. Lorelei Pont: Amlodipine 5 mg  Atorvastatin 10 mg HCTZ 25 mg  Ibuprofen 800 mg  Medications requested 10/22/20  Confirmed delivery date of 10/26/20, advised patient that pharmacy will contact them the morning of delivery.  BP readings are as follows: 115/74 145/90 135/85 No pulse readings available  States son is out of the hospital and currently staying with her and her husband.  Star Rating Drugs: Atorvastatin 10 mg 07/25/20  90 DS   Follow-Up:  Coordination of Enhanced Pharmacy Services and Pharmacist Review  [name], CPP notified  Dana Hayes, CMA Clinical  Pharmacy Assistant [phone]  I have reviewed the care management and care coordination activities outlined in this encounter and I am certifying that I agree with the content of this note. No further action required.  [name], PharmD Clinical Pharmacist [facility] at [facility] [phone]  Total time spent for month: 40

## 2020-10-22 ENCOUNTER — Other Ambulatory Visit: Payer: Self-pay | Admitting: *Deleted

## 2020-10-22 MED ORDER — ATORVASTATIN CALCIUM 10 MG PO TABS: 10.0000 mg | ORAL_TABLET | Freq: Every day | ORAL | 2 refills | Status: DC

## 2020-10-22 MED ORDER — AMLODIPINE BESYLATE 5 MG PO TABS: 5.0000 mg | ORAL_TABLET | Freq: Every day | ORAL | 2 refills | Status: DC

## 2020-10-22 MED ORDER — IBUPROFEN 800 MG PO TABS: 800.0000 mg | ORAL_TABLET | Freq: Three times a day (TID) | ORAL | 1 refills | Status: DC | PRN

## 2020-10-22 MED ORDER — HYDROCHLOROTHIAZIDE 25 MG PO TABS: 25.0000 mg | ORAL_TABLET | Freq: Every day | ORAL | 2 refills | Status: DC

## 2020-10-22 NOTE — Telephone Encounter (Signed)
-----   Message from [place] sent at 10/22/2020  8:46 AM EDT ----- Regarding: Refills Contact: [phone] Will you please send in refills to [facility] for the following medications  Amlodipine 5 mg  Atorvastatin 10 mg HCTZ 25 mg  Ibuprofen800 mg  Thank you, [employer]

## 2020-10-22 NOTE — Telephone Encounter (Signed)
Last office visit 08/02/2020 for CPE.  Last refilled 07/10/2020 for #90 with 1 refill.  CPE schedluled for 08/07/2021.

## 2020-11-19 ENCOUNTER — Telehealth: Payer: Self-pay

## 2020-11-19 NOTE — Chronic Care Management (AMB) (Addendum)
    Chronic Care Management Pharmacy Assistant   Name: Dana Hayes  MRN: [id number] DOB: Mar 26, 1946  Reason for Encounter: Medication Review- Medication Adherence and Delivery Coordination  Recent office visits:  None since last CCM contact  Recent consult visits:  None since last CCM contact  Hospital visits:  None in previous 6 months  Medications: Outpatient Encounter Medications as of 11/19/2020  Medication Sig   albuterol (PROVENTIL HFA;VENTOLIN HFA) 108 (90 Base) MCG/ACT inhaler Inhale 2 puffs into the lungs every 6 (six) hours as needed for wheezing or shortness of breath.   amLODipine (NORVASC) 5 MG tablet Take 1 tablet (5 mg total) by mouth daily.   atorvastatin (LIPITOR) 10 MG tablet Take 1 tablet (10 mg total) by mouth daily.   cetirizine (ZYRTEC) 10 MG tablet Take 10 mg daily by mouth.   cholecalciferol (VITAMIN D) 1000 UNITS tablet Take 1,000 Units by mouth daily.   esomeprazole (NEXIUM) 40 MG capsule Take 40 mg by mouth daily as needed.   finasteride (PROPECIA) 1 MG tablet Take 1 tablet (1 mg total) by mouth daily.   hydrochlorothiazide (HYDRODIURIL) 25 MG tablet Take 1 tablet (25 mg total) by mouth daily.   ibuprofen (ADVIL) 800 MG tablet Take 1 tablet (800 mg total) by mouth every 8 (eight) hours as needed.   Multiple Vitamin (MULTIVITAMIN) tablet Take 1 tablet by mouth daily.   Probiotic Product (PROBIOTIC PO) Take 1 tablet by mouth daily.   VITAMIN E PO Take 1 tablet by mouth daily.   Facility-Administered Encounter Medications as of 11/19/2020  Medication   albuterol (PROVENTIL) (2.5 MG/3ML) 0.083% nebulizer solution 2.5 mg   BP Readings from Last 3 Encounters:  08/02/20 140/70  07/20/19 110/60  04/26/19 (!) 145/84    Lab Results  Component Value Date   HGBA1C 6.2 07/20/2020    Patient obtains medications through Adherence Packaging  90 Days   Last adherence delivery date: 10/24/20   90 DS Packs:  Amlodipine 5 mg - 1 tablet  daily(breakfast) Atorvastatin 10 mg - 1 tablet daily(breakfast) Finasteride 1 mg - 1 tablet daily (breakfast) HCTZ 25 mg - 1 tablet daily(breakfast) Vials Ibuprofen 800 mg- 1 tablet every 8 hours as needed - VIALS    Patient declined the following medications last delivery: No medications declined  Patient is due for next adherence delivery on: 01/22/21  Spoke with patient on 11/19/20 and reviewed medications to ensure patient does not need any medications at this time.  This delivery to include:  No medications needed  Patient declined the following medications this month: All adherence medications  Patient does not need refills on any medications.\  Patient states she has not checked blood pressure or blood sugars recently.   Follow-Up:  Coordination of Enhanced Pharmacy Services and Pharmacist Review  [name], CPP notified  Margaretmary Dys, [place] Pharmacy Assistant [phone]  I have reviewed the care management and care coordination activities outlined in this encounter and I am certifying that I agree with the content of this note. No further action required.  [name], PharmD Clinical Pharmacist [facility] at [facility] [phone]

## 2020-12-27 ENCOUNTER — Other Ambulatory Visit: Payer: Self-pay

## 2020-12-27 ENCOUNTER — Other Ambulatory Visit (INDEPENDENT_AMBULATORY_CARE_PROVIDER_SITE_OTHER): Payer: PPO

## 2020-12-27 ENCOUNTER — Encounter: Payer: Self-pay | Admitting: Family Medicine

## 2020-12-27 ENCOUNTER — Other Ambulatory Visit: Payer: Self-pay | Admitting: Family Medicine

## 2020-12-27 ENCOUNTER — Telehealth: Payer: Self-pay

## 2020-12-27 ENCOUNTER — Telehealth (INDEPENDENT_AMBULATORY_CARE_PROVIDER_SITE_OTHER): Payer: PPO | Admitting: Family Medicine

## 2020-12-27 VITALS — Temp 97.4°F | Wt 159.0 lb

## 2020-12-27 DIAGNOSIS — Z20822 Contact with and (suspected) exposure to covid-19: Secondary | ICD-10-CM

## 2020-12-27 DIAGNOSIS — J4531 Mild persistent asthma with (acute) exacerbation: Secondary | ICD-10-CM

## 2020-12-27 DIAGNOSIS — I10 Essential (primary) hypertension: Secondary | ICD-10-CM

## 2020-12-27 DIAGNOSIS — E119 Type 2 diabetes mellitus without complications: Secondary | ICD-10-CM | POA: Diagnosis not present

## 2020-12-27 DIAGNOSIS — J069 Acute upper respiratory infection, unspecified: Secondary | ICD-10-CM

## 2020-12-27 MED ORDER — BENZONATATE 100 MG PO CAPS: 100.0000 mg | ORAL_CAPSULE | Freq: Three times a day (TID) | ORAL | 0 refills | Status: DC | PRN

## 2020-12-27 NOTE — Telephone Encounter (Signed)
See visit from today

## 2020-12-27 NOTE — Progress Notes (Signed)
I connected with Dana Hayes on 12/27/20 at  1:20 PM EDT by video and verified that I am speaking with the correct person using two identifiers.   I discussed the limitations, risks, security and privacy concerns of performing an evaluation and management service by video and the availability of in person appointments. I also discussed with the patient that there may be a patient responsible charge related to this service. The patient expressed understanding and agreed to proceed.  Patient location: Home Provider Location: [facility] Participants: Lesleigh Noe and Dana Hayes   Subjective:     Dana Hayes is a 75 y.o. female presenting for Cough (Worse at night. "Burning in chest") and Shortness of Breath (Inhaler helps)     Cough This is a new problem. Episode onset: 12/25/2020. The problem has been gradually worsening. The problem occurs hourly. The cough is productive of sputum. Associated symptoms include chest pain (burning), postnasal drip, shortness of breath and wheezing. Pertinent negatives include no chills, ear pain, fever, headaches, myalgias, nasal congestion or sore throat. The symptoms are aggravated by lying down. She has tried steroid inhaler (emergency and hydration) for the symptoms. The treatment provided mild relief. Her past medical history is significant for asthma.  Shortness of Breath Associated symptoms include chest pain (burning) and wheezing. Pertinent negatives include no ear pain, fever, headaches or sore throat. Her past medical history is significant for asthma.   Son had recent illness and was negative for covid and flu, ultimately was prescribed an antibiotic  Sick contact: son Loss of taste or smell   Vaccinated to covid No hx of covid   Review of Systems  Constitutional: Negative for chills and fever.  HENT: Positive for postnasal drip. Negative for ear pain and sore throat.   Respiratory: Positive for cough, shortness of  breath and wheezing.   Cardiovascular: Positive for chest pain (burning).  Musculoskeletal: Negative for myalgias.  Neurological: Negative for headaches.     Social History   Tobacco Use  Smoking Status Never Smoker  Smokeless Tobacco Never Used        Objective:   BP Readings from Last 3 Encounters:  08/02/20 140/70  07/20/19 110/60  04/26/19 (!) 145/84   Wt Readings from Last 3 Encounters:  12/27/20 159 lb (72.1 kg)  08/02/20 153 lb 8 oz (69.6 kg)  07/20/19 175 lb 8 oz (79.6 kg)    Temp (!) 97.4 F (36.3 C) (Oral)   Wt 159 lb (72.1 kg)   BMI 30.04 kg/m    Physical Exam Constitutional:      Appearance: Normal appearance. She is not ill-appearing.  HENT:     Head: Normocephalic and atraumatic.     Right Ear: External ear normal.     Left Ear: External ear normal.  Eyes:     Conjunctiva/sclera: Conjunctivae normal.  Pulmonary:     Effort: Pulmonary effort is normal. No respiratory distress.  Neurological:     Mental Status: She is alert. Mental status is at baseline.  Psychiatric:        Mood and Affect: Mood normal.        Behavior: Behavior normal.        Thought Content: Thought content normal.        Judgment: Judgment normal.            Assessment & Plan:   Problem List Items Addressed This Visit      Cardiovascular and Mediastinum  Essential hypertension (Chronic)     Respiratory   Mild persistent asthma with acute exacerbation in adult     Endocrine   Diabetes mellitus type 2, diet-controlled (HCC) (Chronic)    Other Visit Diagnoses    Viral URI with cough    -  Primary   Relevant Medications   benzonatate (TESSALON PERLES) 100 MG capsule     Discussed OTC treatment for viral illness Patient will come today for drive-up testing Instructed to isolate until the results come back  Discussed increased risk for severe covid due to htn, asthma, and DM and is eligible for treatment with paxlovid. If positive she would like  medication.   She knows to hold her atorvastatin if she were to start the medication    Return if symptoms worsen or fail to improve.  Lesleigh Noe, MD

## 2020-12-27 NOTE — Telephone Encounter (Signed)
I spoke with pt; pt did not go to ED or UC; pt said when she got up and moved around, drank coffee she is not having the SOB now. Pt said she already has video visit 12/27/20 at 1:20 with Dr Einar Pheasant. UC & ED precautions given and pt voiced understanding but wants to start with VV today. Sending note to Dr Einar Pheasant.

## 2020-12-27 NOTE — Telephone Encounter (Signed)
**Note Hayes-Identified via Obfuscation** [facility] - Client TELEPHONE ADVICE RECORD AccessNurse Patient Name: Dana Hayes Gender: Female DOB: 1945-11-14 Age: 75 Y 13 D Return Phone Number: [phone] (Primary) Address: City/ State/ Zip: [place] [address] Client [facility] - Client Client Site [place] - Day Physician Copland, Frederico Hamman - MD Contact Type Call Who Is Calling Patient / Member / Family / Caregiver Call Type Triage / Clinical Relationship To Patient Self Return Phone Number [phone] (Primary) Chief Complaint CHEST PAIN - pain, pressure, heaviness or tightness Reason for Call Symptomatic / Request for [place] states she is experiencing a cough with chest pain and shortness of breath. [place] Not [facility]. Translation No Nurse Assessment Nurse: Raenette Rover, RN, Zella Ball Date/Time Eilene Ghazi Time): 12/27/2020 9:16:42 AM Confirm and document reason for call. If symptomatic, describe symptoms. ---Caller states she is experiencing a cough with chest pain and shortness of breath. When she coughs I can feel it in my chest. Hx of Asthma Does the patient have any new or worsening symptoms? ---Yes Will a triage be completed? ---Yes Related visit to physician within the last 2 weeks? ---No Does the PT have any chronic conditions? (i.e. diabetes, asthma, this includes High risk factors for pregnancy, etc.) ---Yes List chronic conditions. ---Asthma Is this a behavioral health or substance abuse call? ---No Guidelines Guideline Title Affirmed Question Affirmed Notes Nurse Date/Time (Eastern Time) Asthma Attack [1] MODERATE asthma attack (e.g., SOB at rest, speaks in phrases, audible wheezes) AND [2] doesn't have neb or inhaler available [name] 12/27/2020 9:18:13 AM PLEASE NOTE: All timestamps contained within this report are represented as [place]  Standard Time. CONFIDENTIALTY NOTICE: This fax transmission is intended only for the addressee. It contains information that is legally privileged, confidential or otherwise protected from use or disclosure. If you are not the intended recipient, you are strictly prohibited from reviewing, disclosing, copying using or disseminating any of this information or taking any action in reliance on or regarding this information. If you have received this fax in error, please notify us immediately by telephone so that we can arrange for its return to [place]. Phone: [phone], Toll-Free: [phone], Fax: [phone] Page: 2 of 2 Call Id: [id number] [place]. Time Eilene Ghazi Time) Disposition Final User 12/27/2020 9:15:10 AM Send to Urgent Oris Drone 12/27/2020 9:20:09 AM Go to ED Now Yes Raenette Rover, RN, Herbert Deaner Disagree/Comply Comply Caller Understands Yes PreDisposition Did not know what to do Care Advice Given Per Guideline GO TO ED NOW: * Another adult should drive. * If immediate transportation is not available via car, rideshare (e.g., Lyft, Uber), or taxi, then the patient should be instructed to call EMS-911. ANOTHER ADULT SHOULD DRIVE: * It is better and safer if another adult drives instead of you. BRING MEDICINES: CARE ADVICE given per Asthma Attack (Adult) guideline. Comments User: Wilson Singer, RN Date/Time Eilene Ghazi Time): 12/27/2020 9:23:46 AM Caller used her husband Ventolin inhaler once. Very sob over the phone. Doesn't have an inhlaer for herself. Referrals GO TO FACILITY OTHER - SPECIFY

## 2020-12-28 LAB — SARS-COV-2, NAA 2 DAY TAT

## 2020-12-28 LAB — NOVEL CORONAVIRUS, NAA: SARS-CoV-2, NAA: NOT DETECTED

## 2020-12-29 ENCOUNTER — Encounter: Payer: Self-pay | Admitting: Family Medicine

## 2020-12-29 DIAGNOSIS — J0140 Acute pansinusitis, unspecified: Secondary | ICD-10-CM

## 2020-12-29 DIAGNOSIS — M545 Low back pain, unspecified: Secondary | ICD-10-CM

## 2021-01-01 MED ORDER — AMOXICILLIN-POT CLAVULANATE 875-125 MG PO TABS: 1.0000 | ORAL_TABLET | Freq: Two times a day (BID) | ORAL | 0 refills | Status: AC

## 2021-01-15 ENCOUNTER — Other Ambulatory Visit: Payer: Self-pay

## 2021-01-15 ENCOUNTER — Telehealth: Payer: Self-pay

## 2021-01-15 ENCOUNTER — Encounter: Payer: Self-pay | Admitting: Family Medicine

## 2021-01-15 ENCOUNTER — Ambulatory Visit (INDEPENDENT_AMBULATORY_CARE_PROVIDER_SITE_OTHER): Payer: PPO | Admitting: Family Medicine

## 2021-01-15 VITALS — BP 152/82 | HR 78 | Temp 97.7°F | Wt 162.0 lb

## 2021-01-15 DIAGNOSIS — M545 Low back pain, unspecified: Secondary | ICD-10-CM

## 2021-01-15 MED ORDER — CYCLOBENZAPRINE HCL 5 MG PO TABS: 5.0000 mg | ORAL_TABLET | Freq: Three times a day (TID) | ORAL | 0 refills | Status: DC | PRN

## 2021-01-15 NOTE — Telephone Encounter (Signed)
I normally see radiculopathy cases back with a 3-4 week follow-up.  With our schedules full, it might be hard to switch/delay patient until tomorrow.

## 2021-01-15 NOTE — Assessment & Plan Note (Signed)
Reassuring exam w/o evidence of radiculopathy or disc disease. No bony tenderness. Suspect low back strain. Will try muscle relaxant advised of risk of sedation and to take at night. Cont NSAIDs. pcp f/u in 3-4 weeks. Call if unimproved and consider PT referral.

## 2021-01-15 NOTE — Telephone Encounter (Signed)
No signs of radicular pain on exam.   Advised 1 month f/u with pcp.

## 2021-01-15 NOTE — Patient Instructions (Signed)
#  Back pain - continue ibuprofen - try heat - flexeril - may make you sleepy, take a night - consider physical therapy if not improving - see Dr. Lorelei Pont in 3-4 week

## 2021-01-15 NOTE — Progress Notes (Signed)
Subjective:     Dana Hayes is a 75 y.o. female presenting for Back Pain (& Shoots down R leg x 5 days )     Back Pain This is a recurrent problem. The current episode started in the past 7 days. The problem occurs daily. The problem has been gradually worsening since onset. The pain is present in the gluteal and lumbar spine. The pain radiates to the right thigh (right buttock). The symptoms are aggravated by sitting (long car ride). Pertinent negatives include no abdominal pain, bladder incontinence, bowel incontinence, fever, numbness, paresthesias, tingling or weakness. She has tried NSAIDs for the symptoms. The treatment provided mild relief.   Has tried chiropractor in the past w/ improvement    Review of Systems  Constitutional:  Negative for fever.  Gastrointestinal:  Negative for abdominal pain and bowel incontinence.  Genitourinary:  Negative for bladder incontinence.  Musculoskeletal:  Positive for back pain.  Neurological:  Negative for tingling, weakness, numbness and paresthesias.    Social History   Tobacco Use  Smoking Status Never  Smokeless Tobacco Never        Objective:    BP Readings from Last 3 Encounters:  01/15/21 (!) 152/82  08/02/20 140/70  07/20/19 110/60   Wt Readings from Last 3 Encounters:  01/15/21 162 lb (73.5 kg)  12/27/20 159 lb (72.1 kg)  08/02/20 153 lb 8 oz (69.6 kg)    BP (!) 152/82   Pulse 78   Temp 97.7 F (36.5 C) (Temporal)   Wt 162 lb (73.5 kg)   SpO2 97%   BMI 30.61 kg/m    Physical Exam Constitutional:      General: She is not in acute distress.    Appearance: She is well-developed. She is not diaphoretic.  HENT:     Right Ear: External ear normal.     Left Ear: External ear normal.  Eyes:     Conjunctiva/sclera: Conjunctivae normal.  Cardiovascular:     Rate and Rhythm: Normal rate.  Pulmonary:     Effort: Pulmonary effort is normal.  Musculoskeletal:     Cervical back: Neck supple.     Comments:  Back Inspection: no step off, no abnormality Palpation: no ttp along spine or paraspinous muscles ROM: pain worse with extension and left flexion and rotation but no restriction Strength: normal Straight leg raise: negative   Skin:    General: Skin is warm and dry.     Capillary Refill: Capillary refill takes less than 2 seconds.  Neurological:     Mental Status: She is alert. Mental status is at baseline.  Psychiatric:        Mood and Affect: Mood normal.        Behavior: Behavior normal.          Assessment & Plan:   Problem List Items Addressed This Visit       Other   Acute right-sided low back pain without sciatica - Primary    Reassuring exam w/o evidence of radiculopathy or disc disease. No bony tenderness. Suspect low back strain. Will try muscle relaxant advised of risk of sedation and to take at night. Cont NSAIDs. pcp f/u in 3-4 weeks. Call if unimproved and consider PT referral.        Relevant Medications   cyclobenzaprine (FLEXERIL) 5 MG tablet     Return in about 4 weeks (around 02/12/2021) for for dr copland for back pain .  Lesleigh Noe, MD  This visit  occurred during the SARS-CoV-2 public health emergency.  Safety protocols were in place, including screening questions prior to the visit, additional usage of staff PPE, and extensive cleaning of exam room while observing appropriate contact time as indicated for disinfecting solutions.

## 2021-01-15 NOTE — Telephone Encounter (Signed)
**Note Hayes-Identified via Obfuscation** Dana Hayes - Client TELEPHONE ADVICE RECORD AccessNurse Patient Name: Dana Hayes Gender: Female DOB: Jan 21, 1946 Age: 75 Y 73 M Return Phone Number: [phone] (Primary) Address: City/ State/ Zip: [place]  [address] Client Dana Hayes - Client Client Site [place] Physician Owens Loffler - MD Contact Type Call Who Is Calling Patient / Member / Family / Caregiver Call Type Triage / Clinical Relationship To Patient Self Return Phone Number [phone] (Primary) Chief Complaint Walking difficulty Reason for Call Symptomatic / Request for [name] is having back pain. She went on a trip for about 8 hours and sat for a long time and now she can hardly walk and she's supposed to leave for the beach Saturday and wants to know if she can be called in something or be seen tomorrow. She's having tingling and burning down the right side of her hip to where she can't walk. Translation No Nurse Assessment Nurse: Norlene Duel, RN, Beth Date/Time (Eastern Time): 01/14/2021 5:10:54 PM Confirm and document reason for call. If symptomatic, describe symptoms. ---Caller was on a car trip for 8 hrs and developed lower back, right hip and right leg pain. Symptoms started on Friday. Ibuprophen is upsetting her stomach. She is having difficulty walking. Does the patient have any new or worsening symptoms? ---Yes Will a triage be completed? ---Yes Related visit to physician within the last 2 weeks? ---No Does the PT have any chronic conditions? (i.e. diabetes, asthma, this includes High risk factors for pregnancy, etc.) ---Yes List chronic conditions. ---Hypertension; Asthma; Is this a behavioral health or substance abuse call? ---No Guidelines Guideline Title Affirmed Question Affirmed Notes Nurse Date/Time (Eastern Time) Back Pain Soft tissue  infection (e.g., abscess, cellulitis) or other serious infection Demos, RN, Beth 01/14/2021 5:14:50 PM PLEASE NOTE: All timestamps contained within this report are represented as [place] Standard Time. CONFIDENTIALTY NOTICE: This fax transmission is intended only for the addressee. It contains information that is legally privileged, confidential or otherwise protected from use or disclosure. If you are not the intended recipient, you are strictly prohibited from reviewing, disclosing, copying using or disseminating any of this information or taking any action in reliance on or regarding this information. If you have received this fax in error, please notify us immediately by telephone so that we can arrange for its return to [place]. Phone: [phone], Toll-Free: [phone], Fax: [phone] Page: 2 of 2 Call Id: [id number] Guidelines Guideline Title Affirmed Question Affirmed Notes Nurse Date/Time Eilene Ghazi Time) (e.g., bacteremia) in last 2 weeks Disp. Time Eilene Ghazi Time) Disposition Final User 01/14/2021 5:21:49 PM See PCP within 24 Hours Yes Demos, RN, Eustaquio Maize Caller Disagree/Comply Comply Caller Understands Yes PreDisposition Call Doctor Care Advice Given Per Guideline SEE PCP WITHIN 24 HOURS: * IF OFFICE WILL BE OPEN: You need to be examined within the next 24 hours. Call your doctor (or NP/PA) when the office opens and make an appointment. CALL BACK IF: * You become worse CARE ADVICE given per Back Pain (Adult) guideline. Comments User: Talmadge Chad, RN Date/Time Eilene Ghazi Time): 01/14/2021 5:16:29 PM Pain is rated 6/10 with movement, 2/10 now User: Talmadge Chad, RN Date/Time (Eastern Time): 01/14/2021 5:17:25 PM Pt. is able to walk, it is just very painful to do so. Referrals REFERRED TO PCP OFFICE

## 2021-01-16 ENCOUNTER — Telehealth: Payer: Self-pay

## 2021-01-16 NOTE — Chronic Care Management (AMB) (Addendum)
Chronic Care Management Pharmacy Assistant   Name: Dana Hayes  MRN: [id number] DOB: June 17, 1946  Reason for Encounter: Medication Adherence and Delivery Coordination   Recent office visits:  01/15/21- Dr. Einar Pheasant- Family practice- Right sided low back pain. Started cyclobenzaprine 5 mg TID. 12/29/20- Dr. Einar Pheasant- Telephone encounter- Started augmentin 875-125 BID for cough. 12/27/20- Dr. Einar Pheasant- Virtual visit. Cough. Started benzonatate 100 mg TID x 7 days  Recent consult visits:  None since last [facility] visits:  None in previous 6 months  Medications: Outpatient Encounter Medications as of 01/16/2021  Medication Sig   albuterol (PROVENTIL HFA;VENTOLIN HFA) 108 (90 Base) MCG/ACT inhaler Inhale 2 puffs into the lungs every 6 (six) hours as needed for wheezing or shortness of breath.   amLODipine (NORVASC) 5 MG tablet Take 1 tablet (5 mg total) by mouth daily.   atorvastatin (LIPITOR) 10 MG tablet Take 1 tablet (10 mg total) by mouth daily.   benzonatate (TESSALON PERLES) 100 MG capsule Take 1 capsule (100 mg total) by mouth 3 (three) times daily as needed.   cetirizine (ZYRTEC) 10 MG tablet Take 10 mg daily by mouth.   cholecalciferol (VITAMIN D) 1000 UNITS tablet Take 1,000 Units by mouth daily.   cyclobenzaprine (FLEXERIL) 5 MG tablet Take 1 tablet (5 mg total) by mouth 3 (three) times daily as needed for muscle spasms.   esomeprazole (NEXIUM) 40 MG capsule Take 40 mg by mouth daily as needed.   finasteride (PROPECIA) 1 MG tablet Take 1 tablet (1 mg total) by mouth daily.   hydrochlorothiazide (HYDRODIURIL) 25 MG tablet Take 1 tablet (25 mg total) by mouth daily.   ibuprofen (ADVIL) 800 MG tablet Take 1 tablet (800 mg total) by mouth every 8 (eight) hours as needed.   Multiple Vitamin (MULTIVITAMIN) tablet Take 1 tablet by mouth daily.   Probiotic Product (PROBIOTIC PO) Take 1 tablet by mouth daily.   VITAMIN E PO Take 1 tablet by mouth daily.   Facility-Administered  Encounter Medications as of 01/16/2021  Medication   albuterol (PROVENTIL) (2.5 MG/3ML) 0.083% nebulizer solution 2.5 mg   BP Readings from Last 3 Encounters:  01/15/21 (!) 152/82  08/02/20 140/70  07/20/19 110/60    Lab Results  Component Value Date   HGBA1C 6.2 07/20/2020      Recent OV, Consult or Hospital visit:  Recent medication changes indicated:  01/15/21- Dr. Einar Pheasant- Family practice- Right sided low back pain. Started cyclobenzaprine 5 mg TID. 12/29/20- Dr. Einar Pheasant- Telephone encounter- Started augmentin 875-125 BID for cough. 12/27/20- Dr. Einar Pheasant- Virtual visit. Cough. Started benzonatate 100 mg TID x 7 days  Last adherence delivery date: 10/24/20     Patient is due for next adherence delivery on: 01/25/21 (states she has about 15 days remaining. Requested delivery closer to 02/02/21)  Spoke with patient on 01/18/21 and reviewed medications and coordinated delivery.  This delivery to include: Adherence Packaging  90 Days  Packs: Amlodipine 5 mg - 1 tablet daily(breakfast) Atorvastatin 10 mg - 1 tablet daily(breakfast) Finasteride 1 mg - 1 tablet daily (breakfast) Hydrochlorothiazide 25 mg - 1 tablet daily(breakfast)  PRN/VIAL medications: N/A  Patient declined the following medications this month: Ibuprofen 800 mg- takes PRN   Patient does not need refills on any medications.  Confirmed delivery date of 01/30/21, advised patient that pharmacy will contact her the morning of delivery.  Patient stated she has not checked her blood pressure recently. Notes she has been sick with upper respiratory infection and  has not felt like it.   Follow-Up:  Coordination of Enhanced Pharmacy Services and Pharmacist Review  [name], CPP notified  Margaretmary Dys, [place] Pharmacy Assistant [phone]  I have reviewed the care management and care coordination activities outlined in this encounter and I am certifying that I agree with the content of this note. No further  action required.  [name], PharmD Clinical Pharmacist [facility] [phone]

## 2021-01-21 MED ORDER — CYCLOBENZAPRINE HCL 5 MG PO TABS
5.0000 mg | ORAL_TABLET | Freq: Three times a day (TID) | ORAL | 0 refills | Status: DC | PRN
Start: 2021-01-21 — End: 2021-09-13

## 2021-01-21 NOTE — Addendum Note (Signed)
Addended by: Waunita Schooner R on: 01/21/2021 12:55 PM   Modules accepted: Orders

## 2021-02-08 ENCOUNTER — Telehealth: Payer: Self-pay

## 2021-02-08 NOTE — Telephone Encounter (Signed)
Patient asking if she could transfer care to Dr Einar Pheasant. She does not have any problems with Dr Lorelei Pont just said Dr Einar Pheasant is at the office more, she has seen her and liked her and knows Dr Lorelei Pont is more sports/ortho provider.  Please let patient know.

## 2021-02-10 NOTE — Telephone Encounter (Signed)
Dana Hayes is a very nice patient.  She has been my patient for about 10 years, and she is a pleasure to work with.  I have known her husband for 20+ years, and he is also my patient.  No barriers for TOC.

## 2021-02-11 NOTE — Telephone Encounter (Signed)
Ok with TOC.   If no concerns, she could plan to schedule her medicare wellness with the nurse and then see me after in February

## 2021-02-12 NOTE — Telephone Encounter (Signed)
Patient advised. Patient does not have any issues right now. Patient already has AWV with nurse in December and re scheduled CPE part with Dr Einar Pheasant in February.

## 2021-02-13 ENCOUNTER — Ambulatory Visit: Payer: PPO | Admitting: Family Medicine

## 2021-03-29 DIAGNOSIS — D2261 Melanocytic nevi of right upper limb, including shoulder: Secondary | ICD-10-CM | POA: Diagnosis not present

## 2021-03-29 DIAGNOSIS — D2262 Melanocytic nevi of left upper limb, including shoulder: Secondary | ICD-10-CM | POA: Diagnosis not present

## 2021-03-29 DIAGNOSIS — D2271 Melanocytic nevi of right lower limb, including hip: Secondary | ICD-10-CM | POA: Diagnosis not present

## 2021-03-29 DIAGNOSIS — D225 Melanocytic nevi of trunk: Secondary | ICD-10-CM | POA: Diagnosis not present

## 2021-03-29 DIAGNOSIS — L821 Other seborrheic keratosis: Secondary | ICD-10-CM | POA: Diagnosis not present

## 2021-03-29 DIAGNOSIS — L658 Other specified nonscarring hair loss: Secondary | ICD-10-CM | POA: Diagnosis not present

## 2021-03-29 DIAGNOSIS — D2272 Melanocytic nevi of left lower limb, including hip: Secondary | ICD-10-CM | POA: Diagnosis not present

## 2021-03-29 DIAGNOSIS — L565 Disseminated superficial actinic porokeratosis (DSAP): Secondary | ICD-10-CM | POA: Diagnosis not present

## 2021-04-23 ENCOUNTER — Telehealth: Payer: Self-pay

## 2021-04-23 ENCOUNTER — Other Ambulatory Visit: Payer: Self-pay | Admitting: Family Medicine

## 2021-04-23 NOTE — Progress Notes (Addendum)
Chronic Care Management Pharmacy Assistant   Name: Dana Hayes  MRN: [id number] DOB: Jan 05, 1946  Reason for Encounter: Medication Adherence and Delivery Coordination   Recent office visits:  None since last CCM contact  Recent consult visits:  None since last CCM contact  Hospital visits:  None in previous 6 months  Medications: Outpatient Encounter Medications as of 04/23/2021  Medication Sig   ibuprofen (ADVIL) 800 MG tablet TAKE ONE TABLET BY MOUTH every EIGHT hours AS NEEDED   albuterol (PROVENTIL HFA;VENTOLIN HFA) 108 (90 Base) MCG/ACT inhaler Inhale 2 puffs into the lungs every 6 (six) hours as needed for wheezing or shortness of breath.   amLODipine (NORVASC) 5 MG tablet Take 1 tablet (5 mg total) by mouth daily.   atorvastatin (LIPITOR) 10 MG tablet Take 1 tablet (10 mg total) by mouth daily.   benzonatate (TESSALON PERLES) 100 MG capsule Take 1 capsule (100 mg total) by mouth 3 (three) times daily as needed.   cetirizine (ZYRTEC) 10 MG tablet Take 10 mg daily by mouth.   cholecalciferol (VITAMIN D) 1000 UNITS tablet Take 1,000 Units by mouth daily.   cyclobenzaprine (FLEXERIL) 5 MG tablet Take 1 tablet (5 mg total) by mouth 3 (three) times daily as needed for muscle spasms.   esomeprazole (NEXIUM) 40 MG capsule Take 40 mg by mouth daily as needed.   finasteride (PROPECIA) 1 MG tablet Take 1 tablet (1 mg total) by mouth daily.   hydrochlorothiazide (HYDRODIURIL) 25 MG tablet Take 1 tablet (25 mg total) by mouth daily.   Multiple Vitamin (MULTIVITAMIN) tablet Take 1 tablet by mouth daily.   Probiotic Product (PROBIOTIC PO) Take 1 tablet by mouth daily.   VITAMIN E PO Take 1 tablet by mouth daily.   Facility-Administered Encounter Medications as of 04/23/2021  Medication   albuterol (PROVENTIL) (2.5 MG/3ML) 0.083% nebulizer solution 2.5 mg   BP Readings from Last 3 Encounters:  01/15/21 (!) 152/82  08/02/20 140/70  07/20/19 110/60    Lab Results  Component Value  Date   HGBA1C 6.2 07/20/2020    No OVs, Consults, or hospital visits since last care coordination call / Pharmacist visit. No medication changes indicated  Last adherence delivery date: 02/02/21  Patient is due for next adherence delivery on: 05/01/2021  Spoke with patient on 09/20/202 reviewed medications and coordinated delivery.  This delivery to include: VIAL medications: Ibuprofen 800mg  - 1 tablet every 8 hours PRN New - Minoxidil 2.5 mg 1/2 tablet daily  Patient declined the following medications this month: Finasteride 1 mg - 1 tablet daily (breakfast) - no longer taking  Packs: She is ~24 days behind on packs, new sync date - 05/27/21 Hydrochlorothiazide 25 mg - 1 tablet daily(breakfast) Atorvastatin 10 mg - 1 tablet daily(breakfast) Amlodipine 5 mg - 1 tablet daily(breakfast)  Any concerns about your medications? No  How often do you forget or accidentally miss a dose? Patient states rarely, but when I called on 04/23/2021 her next packaging for her to take is on 03/30/2021  Do you use a pillbox? No  Is patient in packaging Yes  If yes  What is the date on your next pill pack? 03/30/2021 breakfast  Any concerns or issues with your packaging? No   No refill request needed.  Confirmed delivery date of 05/24/21, advised patient that pharmacy will contact them the morning of delivery.   Recent blood pressure readings are as follows: Patient does not check her BP at home very often. Can not  recall last reading.   Recent blood glucose readings are as follows: Patient does not check her BP at home very often. Can not recall last reading.   Annual wellness visit in last year? Yes 08/02/2020 Most Recent BP reading: 152/82 on 01/15/2021   If Diabetic: Most recent A1C reading: 6.2 on 07/20/2020 Last eye exam / retinopathy screening: 10/03/2019 Last diabetic foot exam: Never done per records  Patient stated that she is sharing her prescription of Ibuprofen 800mg  with her  husband. I advised patient to not share any of her medications with anyone. Advised that husband should obtain a prescription for any medications he needs.   Dana Hayes, CPP notified  Dana Hayes, [place] Clinical Pharmacy Assistant [phone]   I have reviewed the care management and care coordination activities outlined in this encounter and I am certifying that I agree with the content of this note. No further action required.  Dana Hayes, PharmD Clinical Pharmacist [facility] at [facility] [phone]

## 2021-04-23 NOTE — Telephone Encounter (Signed)
Last office visit 01/15/2021 with Dr. Einar Pheasant for back pain.  Last refilled 10/22/2020 for #90 with 1 refill.  CPE scheduled for 09/09/2021.

## 2021-04-29 NOTE — Addendum Note (Signed)
Addended by: [name] on: 04/29/2021 02:49 PM   Modules accepted: Orders

## 2021-05-14 ENCOUNTER — Telehealth: Payer: Self-pay

## 2021-05-14 NOTE — Chronic Care Management (AMB) (Addendum)
Chronic Care Management Pharmacy Assistant   Name: Dana Hayes  MRN: [id number] DOB: 1946-06-03  Reason for Encounter: Medication Adherence and Delivery Coordination  Recent office visits:  None since last CCM contact  Recent consult visits:  None since last CCM contact  Hospital visits:  None in previous 6 months  Medications: Outpatient Encounter Medications as of 05/14/2021  Medication Sig Note   albuterol (PROVENTIL HFA;VENTOLIN HFA) 108 (90 Base) MCG/ACT inhaler Inhale 2 puffs into the lungs every 6 (six) hours as needed for wheezing or shortness of breath.    amLODipine (NORVASC) 5 MG tablet Take 1 tablet (5 mg total) by mouth daily.    atorvastatin (LIPITOR) 10 MG tablet Take 1 tablet (10 mg total) by mouth daily.    benzonatate (TESSALON PERLES) 100 MG capsule Take 1 capsule (100 mg total) by mouth 3 (three) times daily as needed.    cetirizine (ZYRTEC) 10 MG tablet Take 10 mg daily by mouth.    cholecalciferol (VITAMIN D) 1000 UNITS tablet Take 1,000 Units by mouth daily.    cyclobenzaprine (FLEXERIL) 5 MG tablet Take 1 tablet (5 mg total) by mouth 3 (three) times daily as needed for muscle spasms.    esomeprazole (NEXIUM) 40 MG capsule Take 40 mg by mouth daily as needed.    hydrochlorothiazide (HYDRODIURIL) 25 MG tablet Take 1 tablet (25 mg total) by mouth daily.    ibuprofen (ADVIL) 800 MG tablet TAKE ONE TABLET BY MOUTH every EIGHT hours AS NEEDED    minoxidil (LONITEN) 2.5 MG tablet Take 2.5 mg by mouth. 04/29/2021: Started 04/22/21 [name], MD   Multiple Vitamin (MULTIVITAMIN) tablet Take 1 tablet by mouth daily.    Probiotic Product (PROBIOTIC PO) Take 1 tablet by mouth daily.    VITAMIN E PO Take 1 tablet by mouth daily.    Facility-Administered Encounter Medications as of 05/14/2021  Medication   albuterol (PROVENTIL) (2.5 MG/3ML) 0.083% nebulizer solution 2.5 mg   BP Readings from Last 3 Encounters:  01/15/21 (!) 152/82  08/02/20 140/70  07/20/19  110/60    Lab Results  Component Value Date   HGBA1C 6.2 07/20/2020      No OVs, Consults, or hospital visits since last care coordination call / Pharmacist visit. No medication changes indicated   Last adherence delivery date: 01/28/21 90 day supply - pt was due 04/30/21 but for unknown reason had ~1 month excess so date was pushed back to 05/24/21.  Patient is due for next adherence delivery on: 05/24/21  Spoke with patient on 05/15/21 reviewed medications and coordinated delivery.  This delivery to include: Adherence Packaging  90 Days  Packs: Hydrochlorothiazide 25 mg - 1 tablet daily(breakfast) Atorvastatin 10 mg - 1 tablet daily(breakfast) Amlodipine 5 mg - 1 tablet daily(breakfast)  VIAL medications: Ibuprofen 800mg  take 1 tablet every 8 hours as needed  Patient declined the following medications this month: Minoxidil 2.5mg  take 1/2 tablet daily (filled 04/22/21 90 day supply) Finasteride 1mg  - 1 tablet (breakfast) - pt reports she is still taking this medication, verified with dermatology they would like her to only take minoxidil. She agrees to d/c finasteride.  Any concerns about your medications? No  How often do you forget or accidentally miss a dose? Rarely If oversleeps  Do you use a pillbox? No  Is patient in packaging Yes  What is the date on your next pill pack 04/30/21  Any concerns or issues with your packaging?  Patient reports she likes the packs  process, does not know how she got off track with package dates. She denies missing any doses of medications.  No refill request needed.  Confirmed delivery date of 05/24/21, advised patient that pharmacy will contact them the morning of delivery.  Recent blood pressure readings are as follows: The patient states not checking regularly but will start taking BP again and recording. She states her range is 140-160 / 70-80 and she feels well these days.  Annual wellness visit in last year? Yes  08/02/20 Most  Recent BP reading:152/82  78-P  01/15/21  [name], CPP notified  Avel Sensor, [place] Assistant [phone]  I have reviewed the care management and care coordination activities outlined in this encounter and I am certifying that I agree with the content of this note. Will confirm medications with dermatology (minoxidil/finasteride) and recommend a visit with PCP for HTN review next month.  Per dermatology, patient should d/c finasteride. Will removed from delivery - 05/17/21.  [name], PharmD Clinical Pharmacist [facility] at [facility] [phone]

## 2021-05-29 ENCOUNTER — Other Ambulatory Visit: Payer: Self-pay | Admitting: Family Medicine

## 2021-05-29 DIAGNOSIS — Z1231 Encounter for screening mammogram for malignant neoplasm of breast: Secondary | ICD-10-CM

## 2021-07-15 ENCOUNTER — Ambulatory Visit
Admission: RE | Admit: 2021-07-15 | Discharge: 2021-07-15 | Disposition: A | Discharge status: Home / Self Care | Payer: PPO | Source: Ambulatory Visit | Attending: Family Medicine | Admitting: Family Medicine

## 2021-07-15 ENCOUNTER — Other Ambulatory Visit: Payer: Self-pay

## 2021-07-15 DIAGNOSIS — Z1231 Encounter for screening mammogram for malignant neoplasm of breast: Secondary | ICD-10-CM | POA: Insufficient documentation

## 2021-07-16 ENCOUNTER — Telehealth: Payer: Self-pay

## 2021-07-16 NOTE — Chronic Care Management (AMB) (Signed)
° ° °  Chronic Care Management Pharmacy Assistant   Name: Dana Hayes  MRN: [id number] DOB: Feb 03, 1946   Reason for Encounter: Reminder Call   Conditions to be addressed/monitored: HTN, HLD, and DMII   Medications: Outpatient Encounter Medications as of 07/16/2021  Medication Sig Note   albuterol (PROVENTIL HFA;VENTOLIN HFA) 108 (90 Base) MCG/ACT inhaler Inhale 2 puffs into the lungs every 6 (six) hours as needed for wheezing or shortness of breath.    amLODipine (NORVASC) 5 MG tablet Take 1 tablet (5 mg total) by mouth daily.    atorvastatin (LIPITOR) 10 MG tablet Take 1 tablet (10 mg total) by mouth daily.    benzonatate (TESSALON PERLES) 100 MG capsule Take 1 capsule (100 mg total) by mouth 3 (three) times daily as needed.    cetirizine (ZYRTEC) 10 MG tablet Take 10 mg daily by mouth.    cholecalciferol (VITAMIN D) 1000 UNITS tablet Take 1,000 Units by mouth daily.    cyclobenzaprine (FLEXERIL) 5 MG tablet Take 1 tablet (5 mg total) by mouth 3 (three) times daily as needed for muscle spasms.    esomeprazole (NEXIUM) 40 MG capsule Take 40 mg by mouth daily as needed.    hydrochlorothiazide (HYDRODIURIL) 25 MG tablet Take 1 tablet (25 mg total) by mouth daily.    ibuprofen (ADVIL) 800 MG tablet TAKE ONE TABLET BY MOUTH every EIGHT hours AS NEEDED    minoxidil (LONITEN) 2.5 MG tablet Take 2.5 mg by mouth. 04/29/2021: Started 04/22/21 [name], MD   Multiple Vitamin (MULTIVITAMIN) tablet Take 1 tablet by mouth daily.    Probiotic Product (PROBIOTIC PO) Take 1 tablet by mouth daily.    VITAMIN E PO Take 1 tablet by mouth daily.    Facility-Administered Encounter Medications as of 07/16/2021  Medication   albuterol (PROVENTIL) (2.5 MG/3ML) 0.083% nebulizer solution 2.5 mg   [name]  did not answer the telephone to remind her of her upcoming telephone visit with [name] on 07/22/21 at 11:30am. Patient was reminded to have all medications, supplements and any blood  glucose and blood pressure readings available for review at appointment. Detailed message left on machine       Star Rating Drugs: Medication:  Last Fill: Day Supply Atorvastatin 10mg  05/17/21 90  [name], CPP notified  Avel Sensor, [place] Assistant [phone]  Total time spent for month CPA: 10 min

## 2021-07-22 ENCOUNTER — Telehealth: Payer: PPO

## 2021-07-22 ENCOUNTER — Telehealth: Payer: Self-pay

## 2021-07-22 NOTE — Telephone Encounter (Addendum)
Patient had a phone appointment scheduled with clinical pharmacist today.   An unsuccessful telephone outreach was attempted. The patient was referred to the pharmacist for assistance with medications, care management and care coordination. Patient answered call but states she is not available today for CCM visit. Rescheduled for September 03, 2020 at 9 AM.    Patient will NOT be penalized in any way for missing a CCM appointment. The no-show fee does not apply.  Chart reviewed prior to visit: Recent office visits: 08/03/21 - Owens Loffler, MD - Pt presented for health maintenance exam. Follow up annually.  Recent consult visits: 01/15/21 - Waunita Schooner, MD - Pt presented for back pain. Start cyclobenzaprine 5 mg TID PRN. Follow up 4 weeks.  Hospital visits: None in previous 6 months  Compliance/Adherence/Medication fill history: Care Gaps: Diabetes - A1c, foot and eye exam   Star-Rating Drugs: Medication:                Last Fill:         Day Supply Atorvastatin 10mg        05/17/21          90  [name], PharmD Clinical Pharmacist [facility] at [facility] [phone]

## 2021-07-22 NOTE — Progress Notes (Deleted)
Chronic Care Management Pharmacy Note  07/22/2021 Name:  Dana Hayes MRN:  [id number] DOB:  1946-06-15  Summary: ***  Recommendations/Changes made from today's visit: ***  Plan: ***   Subjective: Dana Hayes is an 75 y.o. year old female who is a primary patient of Copland, Frederico Hamman, MD.  The CCM team was consulted for assistance with disease management and care coordination needs.    {CCMTELEPHONEFACETOFACE:21091510} for {CCMINITIALFOLLOWUPCHOICE:21091511} in response to provider referral for pharmacy case management and/or care coordination services.   Consent to Services:  {CCMCONSENTOPTIONS:25074}  Patient Care Team: Owens Loffler, MD as PCP - General Oneta Rack, MD as Consulting Physician (Dermatology) [name], [facility] as Pharmacist (Pharmacist)  Recent office visits: 08/03/21 - Owens Loffler, MD - Pt presented for health maintenance exam. Follow up annually.  Recent consult visits: 01/15/21 - Waunita Schooner, MD - Pt presented for back pain. Start cyclobenzaprine 5 mg TID PRN. Follow up 4 weeks.  Hospital visits: None in previous 6 months   Objective:  Lab Results  Component Value Date   CREATININE 0.70 07/20/2020   BUN 21 07/20/2020   GFR 86.30 07/20/2020   GFRNONAA >60 06/15/2017   GFRAA >60 06/15/2017   NA 136 07/20/2020   K 3.6 07/20/2020   CALCIUM 9.0 07/20/2020   CO2 27 07/20/2020   GLUCOSE 114 (H) 07/20/2020    Lab Results  Component Value Date/Time   HGBA1C 6.2 07/20/2020 08:17 AM   HGBA1C 7.2 (H) 07/18/2019 09:25 AM   GFR 86.30 07/20/2020 08:17 AM   GFR 85.15 07/18/2019 09:25 AM   MICROALBUR <0.7 07/20/2020 08:17 AM   MICROALBUR <0.7 07/18/2019 09:47 AM     Lab Results  Component Value Date   CHOL 164 07/20/2020   HDL 58.10 07/20/2020   LDLCALC 83 07/20/2020   LDLDIRECT 162.6 05/29/2010   TRIG 113.0 07/20/2020   CHOLHDL 3 07/20/2020    Hepatic Function Latest Ref Rng & Units 07/20/2020 07/18/2019 06/17/2018   Total Protein 6.0 - 8.3 g/dL 6.7 6.4 6.6  Albumin 3.5 - 5.2 g/dL 4.3 4.3 4.3  AST 0 - 37 U/L 17 19 22   ALT 0 - 35 U/L 21 27 32  Alk Phosphatase 39 - 117 U/L 79 64 50  Total Bilirubin 0.2 - 1.2 mg/dL 0.4 0.6 0.5  Bilirubin, Direct 0.0 - 0.3 mg/dL 0.1 0.1 -    Lab Results  Component Value Date/Time   TSH 0.82 06/17/2018 08:36 AM   TSH 0.50 05/18/2017 03:23 PM   FREET4 1.3 08/01/2008 08:30 AM    CBC Latest Ref Rng & Units 07/20/2020 07/18/2019 06/17/2018  WBC 4.0 - 10.5 K/uL 8.3 7.4 7.2  Hemoglobin 12.0 - 15.0 g/dL 15.1(H) 15.0 14.9  Hematocrit 36.0 - 46.0 % 44.7 45.3 44.7  Platelets 150.0 - 400.0 K/uL 341.0 320.0 311.0    Lab Results  Component Value Date/Time   VD25OH 71.87 07/20/2020 08:17 AM   VD25OH 41.45 06/17/2018 08:36 AM    Clinical ASCVD: No  The 10-year ASCVD risk score (Arnett DK, et al., 2019) is: 39.2%   Values used to calculate the score:     Age: 88 years     Sex: Female     Is Non-Hispanic African American: No     Diabetic: Yes     Tobacco smoker: No     Systolic Blood Pressure: 415 mmHg     Is BP treated: Yes     HDL Cholesterol: 58.1 mg/dL     Total Cholesterol:  164 mg/dL    Depression screen [facility] 2/9 07/24/2020 07/19/2019 06/23/2018  Decreased Interest 0 0 0  Down, Depressed, Hopeless 0 0 0  PHQ - 2 Score 0 0 0  Altered sleeping 0 0 -  Tired, decreased energy 0 0 -  Change in appetite 0 0 -  Feeling bad or failure about yourself  0 0 -  Trouble concentrating 0 0 -  Moving slowly or fidgety/restless 0 0 -  Suicidal thoughts 0 0 -  PHQ-9 Score 0 0 -  Difficult doing work/chores Not difficult at all Not difficult at all -    Social History   Tobacco Use  Smoking Status Never  Smokeless Tobacco Never   BP Readings from Last 3 Encounters:  01/15/21 (!) 152/82  08/02/20 140/70  07/20/19 110/60   Pulse Readings from Last 3 Encounters:  01/15/21 78  08/02/20 82  07/20/19 90   Wt Readings from Last 3 Encounters:  01/15/21 162 lb (73.5  kg)  12/27/20 159 lb (72.1 kg)  08/02/20 153 lb 8 oz (69.6 kg)   BMI Readings from Last 3 Encounters:  01/15/21 30.61 kg/m  12/27/20 30.04 kg/m  08/02/20 29.00 kg/m    Assessment/Interventions: Review of patient past medical history, allergies, medications, health status, including review of consultants reports, laboratory and other test data, was performed as part of comprehensive evaluation and provision of chronic care management services.   SDOH:  (Social Determinants of Health) assessments and interventions performed: Yes  SDOH Screenings   Alcohol Screen: Low Risk    Last Alcohol Screening Score (AUDIT): 1  Depression (PHQ2-9): Low Risk    PHQ-2 Score: 0  Financial Resource Strain: Low Risk    Difficulty of Paying Living Expenses: Not hard at all  Food Insecurity: No Food Insecurity   Worried About Charity fundraiser in the Last Year: Never true   Ran Out of Food in the Last Year: Never true  Housing: Low Risk    Last Housing Risk Score: 0  Physical Activity: Insufficiently Active   Days of Exercise per Week: 3 days   Minutes of Exercise per Session: 40 min  Social Connections: Not on file  Stress: No Stress Concern Present   Feeling of Stress : Not at all  Tobacco Use: Low Risk    Smoking Tobacco Use: Never   Smokeless Tobacco Use: Never   Passive Exposure: Not on file  Transportation Needs: No Transportation Needs   Lack of Transportation (Medical): No   Lack of Transportation (Non-Medical): No    CCM Care Plan  Allergies  Allergen Reactions   Codeine     REACTION: Nausea and vomiting   Ace Inhibitors Cough   Estradiol Rash    REACTION: rash at site of administration, topical rash only    Medications Reviewed Today     Reviewed by [name], [facility] (Pharmacist) on 04/29/21 at 1445  Med List Status: <None>   Medication Order Taking? Sig Documenting Provider Last Dose Status Informant  albuterol (PROVENTIL HFA;VENTOLIN HFA) 108 (90 Base) MCG/ACT  inhaler [id number]  Inhale 2 puffs into the lungs every 6 (six) hours as needed for wheezing or shortness of breath. Pleas Koch, NP  Active   albuterol (PROVENTIL) (2.5 MG/3ML) 0.083% nebulizer solution 2.5 mg [id number]   Burnard Hawthorne, FNP  Active   amLODipine (NORVASC) 5 MG tablet [id number] Yes Take 1 tablet (5 mg total) by mouth daily. Copland, Frederico Hamman, MD Taking Active   atorvastatin (LIPITOR) 10  MG tablet [id number] Yes Take 1 tablet (10 mg total) by mouth daily. Copland, Frederico Hamman, MD Taking Active   benzonatate (TESSALON PERLES) 100 MG capsule [id number]  Take 1 capsule (100 mg total) by mouth 3 (three) times daily as needed. Lesleigh Noe, MD  Active   cetirizine (ZYRTEC) 10 MG tablet [id number]  Take 10 mg daily by mouth. [provider]  Active Self  cholecalciferol (VITAMIN D) 1000 UNITS tablet [id number]  Take 1,000 Units by mouth daily. [provider]  Active Self  cyclobenzaprine (FLEXERIL) 5 MG tablet [id number]  Take 1 tablet (5 mg total) by mouth 3 (three) times daily as needed for muscle spasms. Lesleigh Noe, MD  Active   esomeprazole (NEXIUM) 40 MG capsule [id number]  Take 40 mg by mouth daily as needed. [provider]  Active   hydrochlorothiazide (HYDRODIURIL) 25 MG tablet [id number] Yes Take 1 tablet (25 mg total) by mouth daily. Copland, Frederico Hamman, MD Taking Active   ibuprofen (ADVIL) 800 MG tablet [id number] Yes TAKE ONE TABLET BY MOUTH every EIGHT hours AS NEEDED Copland, Spencer, MD Taking Active   minoxidil (LONITEN) 2.5 MG tablet [id number] Yes Take 2.5 mg by mouth. [provider] Taking Active            Med Note Wendelyn Breslow Apr 29, 2021  2:45 PM) Started 04/22/21 [name], MD  Multiple Vitamin (MULTIVITAMIN) tablet [id number]  Take 1 tablet by mouth daily. [provider]  Active Self  Probiotic Product (PROBIOTIC PO) [id number]  Take 1 tablet by mouth daily. [provider]  Active   VITAMIN E PO  [id number]  Take 1 tablet by mouth daily. [provider]  Active Self            Patient Active Problem List   Diagnosis Date Noted   Acute right-sided low back pain without sciatica 01/15/2021   Osteopenia 02/24/2019   Diabetes mellitus type 2, diet-controlled ([place]) 06/23/2018   DIVERTICULOSIS OF COLON 12/28/2007   Hyperlipidemia 10/21/2007   Essential hypertension 10/21/2007   ALLERGIC RHINITIS 10/21/2007   Mild persistent asthma with acute exacerbation in adult 10/21/2007   GERD 10/21/2007   NEPHROLITHIASIS, HX OF 10/21/2007    Immunization History  Administered Date(s) Administered   Fluad Quad(high Dose 65+) 04/05/2019, 05/06/2021   Influenza Split 06/09/2011   Influenza Whole 06/04/2008, 06/12/2009, 06/05/2010   Influenza, High Dose Seasonal PF 05/07/2015, 05/12/2020   Influenza,inj,Quad PF,6+ Mos 07/05/2013, 05/10/2014, 05/09/2016, 05/18/2017, 05/20/2018   PFIZER(Purple Top)SARS-COV-2 Vaccination 09/16/2019, 10/07/2019, 05/09/2020   Pneumococcal Conjugate-13 03/02/2014   Pneumococcal Polysaccharide-23 05/09/2016   Tdap 08/23/2012   Zoster, Live 07/11/2008    Conditions to be addressed/monitored:  Hypertension, Hyperlipidemia, Diabetes, Asthma, and Osteopenia  There are no care plans that you recently modified to display for this patient.   Current Barriers:  {pharmacybarriers:24917}  Pharmacist Clinical Goal(s):  Patient will {PHARMACYGOALCHOICES:24921} through collaboration with PharmD and provider.   Interventions: 1:1 collaboration with Owens Loffler, MD regarding development and update of comprehensive plan of care as evidenced by provider attestation and co-signature Inter-disciplinary care team collaboration (see longitudinal plan of care) Comprehensive medication review performed; medication list updated in electronic medical record  Hypertension (BP goal {CHL HP UPSTREAM Pharmacist BP [phone]}) -{US  controlled/uncontrolled:25276} -Current treatment: Amlodipine 5 mg - 1 tablet daily HCTZ 25 mg - 1 tablet daily -Medications previously tried: ***  -Current home readings: *** -Current dietary habits: *** -Current exercise habits: *** -{ACTIONS;DENIES/REPORTS:21021675::"Denies"} hypotensive/hypertensive symptoms -Educated on {CCM  BP Counseling:25124} -Counseled to monitor BP at home ***, document, and provide log at future appointments -{CCMPHARMDINTERVENTION:25122}  Hyperlipidemia: (LDL goal < ***) -{US controlled/uncontrolled:25276} -Current treatment: Atorvastatin 10 mg - 1 tablet daily -Medications previously tried: ***  -Current dietary patterns: *** -Current exercise habits: *** -Educated on {CCM HLD Counseling:25126} -{CCMPHARMDINTERVENTION:25122}  Diabetes (A1c goal {A1c goals:23924}) -{US controlled/uncontrolled:25276} -Current medications: *** -Medications previously tried: ***  -Current home glucose readings fasting glucose: *** post prandial glucose: *** -{ACTIONS;DENIES/REPORTS:21021675::"Denies"} hypoglycemic/hyperglycemic symptoms -Current meal patterns:  breakfast: ***  lunch: ***  dinner: *** snacks: *** drinks: *** -Current exercise: *** -Educated on {CCM DM COUNSELING:25123} -Counseled to check feet daily and get yearly eye exams -{CCMPHARMDINTERVENTION:25122}  Osteoporosis / Osteopenia (Goal ***) -{US controlled/uncontrolled:25276} -Last DEXA Scan: ***   T-Score femoral neck: ***  T-Score total hip: ***  T-Score lumbar spine: ***  T-Score forearm radius: ***  10-year probability of major osteoporotic fracture: ***  10-year probability of hip fracture: *** -Patient {is;is not an osteoporosis candidate:23886} -Current treatment  *** -Medications previously tried: ***  -{Osteoporosis Counseling:23892} -{CCMPHARMDINTERVENTION:25122}  *** (Goal: ***) -{US controlled/uncontrolled:25276} -Current treatment  *** -Medications previously tried:  ***  -{CCMPHARMDINTERVENTION:25122}  Patient Goals/Self-Care Activities Patient will:  - {pharmacypatientgoals:24919}  Follow Up Plan: {CM FOLLOW UP OTRR:11657}   Medication Assistance: {MEDASSISTANCEINFO:25044}  Compliance/Adherence/Medication fill history: Care Gaps: Diabetes - A1c, foot and eye exam   Star-Rating Drugs: Medication:                Last Fill:         Day Supply Atorvastatin 66m       05/17/21          90    Patient's preferred pharmacy is:  [place] [address] [place]Phone: [phone]: [phone] [id number] [address]one: [phone]: [id number]one: [phone]: [phone] pill box? {Yes or If no, why not?:20788} Pt endorses ***% compliance  We discussed: {Pharmacy options:24294} Patient decided to: {US Pharmacy PlaYTRZ:73567}are Plan and Follow Up Patient Decision:  {FOLLOWUP:24991}  Plan: {CM FOLLOW UP PLAOLID:03013}icDebbora DusharmD Clinical Pharmacist Practitioner Le[place]k[facility]atesimary [facility]Eh[facility] [phone]

## 2021-07-25 ENCOUNTER — Encounter: Payer: Self-pay | Admitting: Family Medicine

## 2021-07-25 NOTE — Progress Notes (Deleted)
Subjective:   Dana Hayes is a 75 y.o. female who presents for Medicare Annual (Subsequent) preventive examination.  I connected with Renie Stelmach today by telephone and verified that I am speaking with the correct person using two identifiers. Location patient: home Location provider: work Persons participating in the virtual visit: patient, Marine scientist.    I discussed the limitations, risks, security and privacy concerns of performing an evaluation and management service by telephone and the availability of in person appointments. I also discussed with the patient that there may be a patient responsible charge related to this service. The patient expressed understanding and verbally consented to this telephonic visit.    Interactive audio and video telecommunications were attempted between this provider and patient, however failed, due to patient having technical difficulties OR patient did not have access to video capability.  We continued and completed visit with audio only.  Some vital signs may be absent or patient reported.   Time Spent with patient on telephone encounter: *** minutes  Review of Systems           Objective:    There were no vitals filed for this visit. There is no height or weight on file to calculate BMI.  Advanced Directives 07/24/2020 07/19/2019 04/26/2019 06/17/2018 06/15/2017 05/18/2017 05/09/2016  Does Patient Have a Medical Advance Directive? Yes Yes Yes Yes Yes Yes Yes  Type of Paramedic of [place];Living will [place];Living will - [place];Living will - [place];Living will [place];Living will  Does patient want to make changes to medical advance directive? - - - - - - No - Patient declined  Copy of [place] in Chart? No - copy requested No - copy requested - No - copy requested - No - copy requested No - copy requested     Current Medications (verified) Outpatient Encounter Medications as of 07/26/2021  Medication Sig   albuterol (PROVENTIL HFA;VENTOLIN HFA) 108 (90 Base) MCG/ACT inhaler Inhale 2 puffs into the lungs every 6 (six) hours as needed for wheezing or shortness of breath.   amLODipine (NORVASC) 5 MG tablet Take 1 tablet (5 mg total) by mouth daily.   atorvastatin (LIPITOR) 10 MG tablet Take 1 tablet (10 mg total) by mouth daily.   benzonatate (TESSALON PERLES) 100 MG capsule Take 1 capsule (100 mg total) by mouth 3 (three) times daily as needed.   cetirizine (ZYRTEC) 10 MG tablet Take 10 mg daily by mouth.   cholecalciferol (VITAMIN D) 1000 UNITS tablet Take 1,000 Units by mouth daily.   cyclobenzaprine (FLEXERIL) 5 MG tablet Take 1 tablet (5 mg total) by mouth 3 (three) times daily as needed for muscle spasms.   esomeprazole (NEXIUM) 40 MG capsule Take 40 mg by mouth daily as needed.   hydrochlorothiazide (HYDRODIURIL) 25 MG tablet Take 1 tablet (25 mg total) by mouth daily.   ibuprofen (ADVIL) 800 MG tablet TAKE ONE TABLET BY MOUTH every EIGHT hours AS NEEDED   minoxidil (LONITEN) 2.5 MG tablet Take 2.5 mg by mouth.   Multiple Vitamin (MULTIVITAMIN) tablet Take 1 tablet by mouth daily.   Probiotic Product (PROBIOTIC PO) Take 1 tablet by mouth daily.   VITAMIN E PO Take 1 tablet by mouth daily.   Facility-Administered Encounter Medications as of 07/26/2021  Medication   albuterol (PROVENTIL) (2.5 MG/3ML) 0.083% nebulizer solution 2.5 mg    Allergies (verified) Codeine, Ace inhibitors, and Estradiol   History:  Past Medical History:  Diagnosis Date   Allergy    Asthma    GERD (gastroesophageal reflux disease)    History of kidney stones    Hyperlipidemia    Hypertension    Obesity    Past Surgical History:  Procedure Laterality Date   ABDOMINAL HYSTERECTOMY     COLONOSCOPY WITH PROPOFOL N/A 04/26/2019   Procedure: COLONOSCOPY WITH PROPOFOL;  Surgeon: Jonathon Bellows, MD;  Location:  [facility]COPY;  Service: Gastroenterology;  Laterality: N/A;   LITHOTRIPSY      TIMES 2 1990   Family History  Problem Relation Age of Onset   Coronary artery disease Mother    Diabetes Mother    Coronary artery disease Brother    Heart attack Brother    Breast cancer Maternal Aunt        early 72s   Social History   Socioeconomic History   Marital status: Married    Spouse name: Not on file   Number of children: Not on file   Years of education: Not on file   Highest education level: Not on file  Occupational History   Occupation: buyer    Employer: [employer]  Tobacco Use   Smoking status: Never   Smokeless tobacco: Never  Vaping Use   Vaping Use: Never used  Substance and Sexual Activity   Alcohol use: Yes    Comment: occassionally a glass of wine or beer   Drug use: No   Sexual activity: Yes  Other Topics Concern   Not on file  Social History Narrative   REGULAR EXERCISE: YES- WALKS 3-4-DAYS A WEEK      Diet: no fast food, instead veggies, loves baking   Social Determinants of Health   Financial Resource Strain: Low Risk    Difficulty of Paying Living Expenses: Not hard at all  Food Insecurity: No Food Insecurity   Worried About Charity fundraiser in the Last Year: Never true   [place] in the Last Year: Never true  Transportation Needs: No Transportation Needs   Lack of Transportation (Medical): No   Lack of Transportation (Non-Medical): No  Physical Activity: Insufficiently Active   Days of Exercise per Week: 3 days   Minutes of Exercise per Session: 40 min  Stress: No Stress Concern Present   Feeling of Stress : Not at all  Social Connections: Not on file    Tobacco Counseling Counseling given: Not Answered   Clinical Intake:                 Diabetes:  Is the patient diabetic?  {YES/NO:21197} If diabetic, was a CBG obtained today?  {YES/NO:21197} Did the patient bring in their glucometer from home?   {YES/NO:21197} How often do you monitor your CBG's? ***.   Financial Strains and Diabetes Management:  Are you having any financial strains with the device, your supplies or your medication? {YES/NO:21197}.  Does the patient want to be seen by Chronic Care Management for management of their diabetes?  {YES/NO:21197} Would the patient like to be referred to a Nutritionist or for Diabetic Management?  {YES/NO:21197}  Diabetic Exams:  Diabetic Eye Exam: Completed ***. Overdue for diabetic eye exam. Pt has been advised about the importance in completing this exam. A referral has been placed today. Message sent to referral coordinator for scheduling purposes. Advised pt to expect a call from our office re: appt.  Diabetic Foot Exam: Completed ***. Pt has been advised about the importance in  completing this exam. Pt is scheduled for diabetic foot exam on ***.           Activities of Daily Living No flowsheet data found.  Patient Care Team: Owens Loffler, MD as PCP - General Oneta Rack, MD as Consulting Physician (Dermatology) [name], [facility] as Pharmacist (Pharmacist)  Indicate any recent Medical Services you may have received from other than [facility] providers in the past year (date may be approximate).     Assessment:   This is a routine wellness examination for [place].  Hearing/Vision screen No results found.  Dietary issues and exercise activities discussed:     Goals Addressed   None    Depression Screen PHQ 2/9 Scores 07/24/2020 07/19/2019 06/23/2018 06/17/2018 05/18/2017 07/15/2016 05/09/2016  PHQ - 2 Score 0 0 0 0 0 0 0  PHQ- 9 Score 0 0 - 0 0 - -    Fall Risk Fall Risk  07/24/2020 07/19/2019 06/17/2018 05/18/2017 07/15/2016  Falls in the past year? 0 0 0 No No  Number falls in past yr: 0 0 - - -  Injury with Fall? 0 0 - - -  Risk for fall due to : Medication side effect Medication side effect - - -  Follow up Falls evaluation completed;Falls  prevention discussed Falls evaluation completed;Falls prevention discussed - - -    FALL RISK PREVENTION PERTAINING TO THE HOME:  Any stairs in or around the home? {YES/NO:21197} If so, are there any without handrails? {YES/NO:21197} Home free of loose throw rugs in walkways, pet beds, electrical cords, etc? {YES/NO:21197} Adequate lighting in your home to reduce risk of falls? {YES/NO:21197}  ASSISTIVE DEVICES UTILIZED TO PREVENT FALLS:  Life alert? {YES/NO:21197} Use of a cane, walker or w/c? {YES/NO:21197} Grab bars in the bathroom? {YES/NO:21197} Shower chair or bench in shower? {YES/NO:21197} Elevated toilet seat or a handicapped toilet? {YES/NO:21197}  TIMED UP AND GO:  Was the test performed? No , visit completed over the phone.     Cognitive Function: MMSE - Mini Mental State Exam 07/24/2020 07/19/2019 06/17/2018 05/18/2017 05/09/2016  Orientation to time 5 5 5 5 5   Orientation to Place 5 5 5 5 5   Registration 3 3 3 3 3   Attention/ Calculation 5 5 0 0 0  Recall 3 3 3 3 3   Language- name 2 objects - - 0 0 0  Language- repeat 1 1 1 1 1   Language- follow 3 step command - - 3 3 3   Language- read & follow direction - - 0 0 0  Write a sentence - - 0 0 0  Copy design - - 0 0 0  Total score - - 20 20 20         Immunizations Immunization History  Administered Date(s) Administered   Fluad Quad(high Dose 65+) 04/05/2019, 05/06/2021   Influenza Split 06/09/2011   Influenza Whole 06/04/2008, 06/12/2009, 06/05/2010   Influenza, High Dose Seasonal PF 05/07/2015, 05/12/2020   Influenza,inj,Quad PF,6+ Mos 07/05/2013, 05/10/2014, 05/09/2016, 05/18/2017, 05/20/2018   PFIZER(Purple Top)SARS-COV-2 Vaccination 09/16/2019, 10/07/2019, 05/09/2020   Pneumococcal Conjugate-13 03/02/2014   Pneumococcal Polysaccharide-23 05/09/2016   Tdap 08/23/2012   Zoster, Live 07/11/2008    TDAP status: Up to date  Flu Vaccine status: Up to date  Pneumococcal vaccine status: Up to  date  {Covid-19 vaccine status:2101808}  Qualifies for Shingles Vaccine? Yes   Zostavax completed Yes   {Shingrix Completed?:2101804}  Screening Tests Health Maintenance  Topic Date Due   FOOT EXAM  Never done  Zoster Vaccines- Shingrix (1 of 2) Never done   COVID-19 Vaccine (4 - Booster for Pfizer series) 07/04/2020   OPHTHALMOLOGY EXAM  10/02/2020   HEMOGLOBIN A1C  01/18/2021   TETANUS/TDAP  08/23/2022   MAMMOGRAM  07/16/2023   COLONOSCOPY (Pts 45-40yrs Insurance coverage will need to be confirmed)  04/25/2029   Pneumonia Vaccine 74+ Years old  Completed   INFLUENZA VACCINE  Completed   DEXA SCAN  Completed   Hepatitis C Screening  Completed   HPV VACCINES  Aged Out    Health Maintenance  Health Maintenance Due  Topic Date Due   FOOT EXAM  Never done   Zoster Vaccines- Shingrix (1 of 2) Never done   COVID-19 Vaccine (4 - Booster for Pfizer series) 07/04/2020   OPHTHALMOLOGY EXAM  10/02/2020   HEMOGLOBIN A1C  01/18/2021    Colorectal cancer screening: Type of screening: Colonoscopy. Completed 04/26/19. Repeat every 10 years  Mammogram status: Completed 07/15/21. Repeat every year  {Bone Density status:21018021}  Lung Cancer Screening: (Low Dose CT Chest recommended if Age 16-80 years, 30 pack-year currently smoking OR have quit w/in 15years.) does not qualify.    Additional Screening:  Hepatitis C Screening: does qualify; Completed 05/09/16  Vision Screening: Recommended annual ophthalmology exams for early detection of glaucoma and other disorders of the eye. Is the patient up to date with their annual eye exam?  {YES/NO:21197} Who is the provider or what is the name of the office in which the patient attends annual eye exams? *** If pt is not established with a provider, would they like to be referred to a provider to establish care? {YES/NO:21197}.   Dental Screening: Recommended annual dental exams for proper oral hygiene  Community Resource Referral /  Chronic Care Management: CRR required this visit?  {YES/NO:21197}  CCM required this visit?  {YES/NO:21197}     Plan:     I have personally reviewed and noted the following in the patients chart:   Medical and social history Use of alcohol, tobacco or illicit drugs  Current medications and supplements including opioid prescriptions.  Functional ability and status Nutritional status Physical activity Advanced directives List of other physicians Hospitalizations, surgeries, and ER visits in previous 12 months Vitals Screenings to include cognitive, depression, and falls Referrals and appointments  In addition, I have reviewed and discussed with patient certain preventive protocols, quality metrics, and best practice recommendations. A written personalized care plan for preventive services as well as general preventive health recommendations were provided to patient.   Due to this being a telephonic visit, the after visit summary with patients personalized plan was offered to patient via mail or my-chart. ***Patient declined at this time./ Patient would like to access on my-chart/ per request, patient was mailed a copy of AVS./ Patient preferred to pick up at office at next visit.   Loma Messing, LPN   [id number]   Nurse Health Advisor  Nurse Notes: none

## 2021-07-26 ENCOUNTER — Ambulatory Visit: Payer: PPO

## 2021-07-31 ENCOUNTER — Other Ambulatory Visit: Payer: PPO

## 2021-08-07 ENCOUNTER — Encounter: Payer: PPO | Admitting: Family Medicine

## 2021-08-12 ENCOUNTER — Other Ambulatory Visit: Payer: Self-pay | Admitting: Family Medicine

## 2021-08-12 NOTE — Telephone Encounter (Signed)
Last office visit 01/15/2021 with Dr. Einar Pheasant for back pain.  Last refilled 04/23/21 for #90 with 3 refills.  Patient is transferring to Dr. Einar Pheasant.  Has CPE scheduled with her on 09/13/2021.

## 2021-08-13 ENCOUNTER — Telehealth: Payer: Self-pay

## 2021-08-13 NOTE — Chronic Care Management (AMB) (Addendum)
Chronic Care Management Pharmacy Assistant   Name: Dana Hayes  MRN: [id number] DOB: 1946/07/27  Reason for Encounter: Medication Adherence and Delivery Coordination  Recent office visits:  None since last CCM contact  Recent consult visits:  None since last CCM contact  Hospital visits:  None in previous 6 months  Medications: Outpatient Encounter Medications as of 08/13/2021  Medication Sig Note   ibuprofen (ADVIL) 800 MG tablet TAKE ONE TABLET BY MOUTH EVERY 8 HOURS AS NEEDED    albuterol (PROVENTIL HFA;VENTOLIN HFA) 108 (90 Base) MCG/ACT inhaler Inhale 2 puffs into the lungs every 6 (six) hours as needed for wheezing or shortness of breath.    amLODipine (NORVASC) 5 MG tablet TAKE ONE TABLET BY MOUTH ONCE DAILY    atorvastatin (LIPITOR) 10 MG tablet TAKE ONE TABLET BY MOUTH ONCE DAILY    benzonatate (TESSALON PERLES) 100 MG capsule Take 1 capsule (100 mg total) by mouth 3 (three) times daily as needed.    cetirizine (ZYRTEC) 10 MG tablet Take 10 mg daily by mouth.    cholecalciferol (VITAMIN D) 1000 UNITS tablet Take 1,000 Units by mouth daily.    cyclobenzaprine (FLEXERIL) 5 MG tablet Take 1 tablet (5 mg total) by mouth 3 (three) times daily as needed for muscle spasms.    esomeprazole (NEXIUM) 40 MG capsule Take 40 mg by mouth daily as needed.    hydrochlorothiazide (HYDRODIURIL) 25 MG tablet TAKE ONE TABLET BY MOUTH ONCE DAILY    minoxidil (LONITEN) 2.5 MG tablet Take 2.5 mg by mouth. 04/29/2021: Started 04/22/21 [name], MD   Multiple Vitamin (MULTIVITAMIN) tablet Take 1 tablet by mouth daily.    Probiotic Product (PROBIOTIC PO) Take 1 tablet by mouth daily.    VITAMIN E PO Take 1 tablet by mouth daily.    Facility-Administered Encounter Medications as of 08/13/2021  Medication   albuterol (PROVENTIL) (2.5 MG/3ML) 0.083% nebulizer solution 2.5 mg   BP Readings from Last 3 Encounters:  01/15/21 (!) 152/82  08/02/20 140/70  07/20/19 110/60    Lab Results   Component Value Date   HGBA1C 6.2 07/20/2020      No OVs, Consults, or hospital visits since last care coordination call / Pharmacist visit. No medication changes indicated   Last adherence delivery date:05/24/21      Patient is due for next adherence delivery on: 08/22/21  Spoke with patient on 08/13/21 reviewed medications and coordinated delivery.  This delivery to include: Adherence Packaging  90 Days  Packs: Hydrochlorothiazide 25 mg - 1 tablet daily(breakfast) Atorvastatin 10 mg - 1 tablet daily(breakfast) Amlodipine 5 mg - 1 tablet daily(breakfast)  VIAL medications: Ibuprofen 800 mg - take 1 tablet every 8 hours as needed Minoxidil 2.5 mg - take 1/2 tablet daily   Patient declined the following medications this month: None  Any concerns about your medications? No  How often do you forget or accidentally miss a dose? Rarely  Is patient in packaging Yes  Any concerns or issues with your packaging? No concerns at this time   Refills requested from providers include: hydrochlorothiazide, atorvastatin, amlodipine, ibuprofen   Confirmed delivery date of 08/22/21, advised patient that pharmacy will contact them the morning of delivery.  No blood pressure readings available Annual wellness visit in last year? No - Patient had to cancel and has not rescheduled. Most Recent BP reading: 152/82  78-P  01/15/21  Patient has CCM appt. 09/03/21  9:00am  [name], CPP notified  Clevland Cork, CCMA Clincal  Pharmacy Assistant [phone]  I have reviewed the care management and care coordination activities outlined in this encounter and I am certifying that I agree with the content of this note. No further action required.  [name], PharmD Clinical Pharmacist [facility] [phone]

## 2021-09-03 ENCOUNTER — Ambulatory Visit (INDEPENDENT_AMBULATORY_CARE_PROVIDER_SITE_OTHER): Payer: PPO

## 2021-09-03 ENCOUNTER — Other Ambulatory Visit: Payer: Self-pay

## 2021-09-03 DIAGNOSIS — E782 Mixed hyperlipidemia: Secondary | ICD-10-CM

## 2021-09-03 DIAGNOSIS — E119 Type 2 diabetes mellitus without complications: Secondary | ICD-10-CM | POA: Diagnosis not present

## 2021-09-03 DIAGNOSIS — I10 Essential (primary) hypertension: Secondary | ICD-10-CM | POA: Diagnosis not present

## 2021-09-03 NOTE — Progress Notes (Signed)
Chronic Care Management Pharmacy Note  09/03/2021 Name:  Dana Hayes MRN:  [id number] DOB:  1946/07/24  Summary: CCM follow up visit. Discussed DM, HTN, and HLD. DM, A1c 6.2%, with lifestyle management. She has not checked home BG. Due for re-eval. Plans to resume exercise at local gym. HTN, stable. Home reading today 136/87. Continues HCTZ and amlodipine in pill packs through [facility]. HLD, due for re-eval, continues atorvastatin daily in pill packs. No barriers to adherence identified. Pt is transitioning PCP to Dr. Einar Pheasant this month, > 1 year since last labs and AWV. Denies health concerns or medication problems. Her medication cost did increase this year. Will have [facility] review. She reports back pain has pretty much resolved. She takes ibuprofen 800 mg PRN, less than once weekly.   Recommendations/Changes made from today's visit: None  Plan: - CCM PharmD follow up 12 months - [facility] pharmacy coordination every 90 days   Subjective: Dana Hayes is an 76 y.o. year old female who is a primary patient of Copland, Frederico Hamman, MD.  The CCM team was consulted for assistance with disease management and care coordination needs.    Engaged with patient by telephone for follow up visit in response to provider referral for pharmacy case management and/or care coordination services.   Consent to Services:  The patient was given information about Chronic Care Management services, agreed to services, and gave verbal consent prior to initiation of services.  Please see initial visit note for detailed documentation.   Patient Care Team: Owens Loffler, MD as PCP - General Oneta Rack, MD as Consulting Physician (Dermatology) [name], [facility] as Pharmacist (Pharmacist)  Recent office visits: 01/15/21 - Waunita Schooner, MD, PCP - Pt presented acute back pain. Start cyclobenzaprine 5 mg TID PRN. Continue ibuprofen PRN. Follow up 3-4 weeks.  08/03/21 - Owens Loffler, MD - Pt presented  for health maintenance exam. Follow up annually.  Recent consult visits: 07/15/21 - Mammogram results normal   Hospital visits: None in previous 6 months   Objective:  Lab Results  Component Value Date   CREATININE 0.70 07/20/2020   BUN 21 07/20/2020   GFR 86.30 07/20/2020   GFRNONAA >60 06/15/2017   GFRAA >60 06/15/2017   NA 136 07/20/2020   K 3.6 07/20/2020   CALCIUM 9.0 07/20/2020   CO2 27 07/20/2020   GLUCOSE 114 (H) 07/20/2020   Lab Results  Component Value Date/Time   HGBA1C 6.2 07/20/2020 08:17 AM   HGBA1C 7.2 (H) 07/18/2019 09:25 AM   GFR 86.30 07/20/2020 08:17 AM   GFR 85.15 07/18/2019 09:25 AM   MICROALBUR <0.7 07/20/2020 08:17 AM   MICROALBUR <0.7 07/18/2019 09:47 AM   Lab Results  Component Value Date   CHOL 164 07/20/2020   HDL 58.10 07/20/2020   LDLCALC 83 07/20/2020   LDLDIRECT 162.6 05/29/2010   TRIG 113.0 07/20/2020   CHOLHDL 3 07/20/2020    Hepatic Function Latest Ref Rng & Units 07/20/2020 07/18/2019 06/17/2018  Total Protein 6.0 - 8.3 g/dL 6.7 6.4 6.6  Albumin 3.5 - 5.2 g/dL 4.3 4.3 4.3  AST 0 - 37 U/L _0 ALT 0 - 35 U/L 21 27 32  Alk Phosphatase 39 - 117 U/L 79 64 50  Total Bilirubin 0.2 - 1.2 mg/dL 0.4 0.6 0.5  Bilirubin, Direct 0.0 - 0.3 mg/dL 0.1 0.1 -    Lab Results  Component Value Date/Time   TSH 0.82 06/17/2018 08:36 AM   TSH 0.50 05/18/2017 03:23 PM  FREET4 1.3 08/01/2008 08:30 AM    CBC Latest Ref Rng & Units 07/20/2020 07/18/2019 06/17/2018  WBC 4.0 - 10.5 K/uL 8.3 7.4 7.2  Hemoglobin 12.0 - 15.0 g/dL 15.1(H) 15.0 14.9  Hematocrit 36.0 - 46.0 % 44.7 45.3 44.7  Platelets 150.0 - 400.0 K/uL 341.0 320.0 311.0    Lab Results  Component Value Date/Time   VD25OH 71.87 07/20/2020 08:17 AM   VD25OH 41.45 06/17/2018 08:36 AM    Clinical ASCVD: No  The 10-year ASCVD risk score (Arnett DK, et al., 2019) is: 39.2%   Values used to calculate the score:     Age: 76 years     Sex: Female     Is Non-Hispanic African  American: No     Diabetic: Yes     Tobacco smoker: No     Systolic Blood Pressure: 401 mmHg     Is BP treated: Yes     HDL Cholesterol: 58.1 mg/dL     Total Cholesterol: 164 mg/dL    Depression screen [facility] 2/9 07/24/2020 07/19/2019 06/23/2018  Decreased Interest 0 0 0  Down, Depressed, Hopeless 0 0 0  PHQ - 2 Score 0 0 0  Altered sleeping 0 0 -  Tired, decreased energy 0 0 -  Change in appetite 0 0 -  Feeling bad or failure about yourself  0 0 -  Trouble concentrating 0 0 -  Moving slowly or fidgety/restless 0 0 -  Suicidal thoughts 0 0 -  PHQ-9 Score 0 0 -  Difficult doing work/chores Not difficult at all Not difficult at all -    DEXA:  2017 AP Spine L1-L4 06/24/2016 68.5 Osteopenia -1.6 0.993 g/cm2 DualFemur Neck Left 06/24/2016 68.5 Osteopenia -2.0 0.766 g/cm2 FRAX* RESULTS: The probability of a major osteoporotic fracture is 10.9% within the next ten years. The probability of a hip fracture is 1.8% within the next ten years.   Social History   Tobacco Use  Smoking Status Never  Smokeless Tobacco Never   BP Readings from Last 3 Encounters:  01/15/21 (!) 152/82  08/02/20 140/70  07/20/19 110/60   Pulse Readings from Last 3 Encounters:  01/15/21 78  08/02/20 82  07/20/19 90   Wt Readings from Last 3 Encounters:  01/15/21 162 lb (73.5 kg)  12/27/20 159 lb (72.1 kg)  08/02/20 153 lb 8 oz (69.6 kg)   BMI Readings from Last 3 Encounters:  01/15/21 30.61 kg/m  12/27/20 30.04 kg/m  08/02/20 29.00 kg/m    Assessment/Interventions: Review of patient past medical history, allergies, medications, health status, including review of consultants reports, laboratory and other test data, was performed as part of comprehensive evaluation and provision of chronic care management services.   SDOH:  (Social Determinants of Health) assessments and interventions performed: Yes SDOH Interventions    Flowsheet Row Most Recent Value  SDOH Interventions   Financial Strain  Interventions Other (Comment)      SDOH Screenings   Alcohol Screen: Not on file  Depression (PHQ2-9): Not on file  Financial Resource Strain: Low Risk    Difficulty of Paying Living Expenses: Not very hard  Food Insecurity: Not on file  Housing: Not on file  Physical Activity: Not on file  Social Connections: Not on file  Stress: Not on file  Tobacco Use: Low Risk    Smoking Tobacco Use: Never   Smokeless Tobacco Use: Never   Passive Exposure: Not on file  Transportation Needs: Not on file    [place]  Allergies  Allergen Reactions   Codeine     REACTION: Nausea and vomiting   Ace Inhibitors Cough   Estradiol Rash    REACTION: rash at site of administration, topical rash only    Medications Reviewed Today     Reviewed by [name], [facility] (Pharmacist) on 09/03/21 at [phone]  Med List Status: <None>   Medication Order Taking? Sig Documenting Provider Last Dose Status Informant  albuterol (PROVENTIL HFA;VENTOLIN HFA) 108 (90 Base) MCG/ACT inhaler [id number] No Inhale 2 puffs into the lungs every 6 (six) hours as needed for wheezing or shortness of breath.  Patient not taking: Reported on 09/03/2021   Pleas Koch, NP Not Taking Active   albuterol (PROVENTIL) (2.5 MG/3ML) 0.083% nebulizer solution 2.5 mg [id number]   Burnard Hawthorne, FNP  Consider Medication Status and Discontinue (Completed Course)   amLODipine (NORVASC) 5 MG tablet [id number] Yes TAKE ONE TABLET BY MOUTH ONCE DAILY Copland, Spencer, MD Taking Active   atorvastatin (LIPITOR) 10 MG tablet [id number] Yes TAKE ONE TABLET BY MOUTH ONCE DAILY Copland, Spencer, MD Taking Active   benzonatate (TESSALON PERLES) 100 MG capsule [id number] No Take 1 capsule (100 mg total) by mouth 3 (three) times daily as needed.  Patient not taking: Reported on 09/03/2021   Lesleigh Noe, MD Not Taking Active   cetirizine (ZYRTEC) 10 MG tablet [id number] Yes Take 10 mg daily by mouth. [provider] Taking Active  Self  cholecalciferol (VITAMIN D) 1000 UNITS tablet [id number] Yes Take 1,000 Units by mouth daily. [provider] Taking Active Self  cyclobenzaprine (FLEXERIL) 5 MG tablet [id number] No Take 1 tablet (5 mg total) by mouth 3 (three) times daily as needed for muscle spasms.  Patient not taking: Reported on 09/03/2021   Lesleigh Noe, MD Not Taking Active   esomeprazole (NEXIUM) 40 MG capsule [id number] Yes Take 40 mg by mouth daily as needed. [provider] Taking Active   hydrochlorothiazide (HYDRODIURIL) 25 MG tablet [id number] Yes TAKE ONE TABLET BY MOUTH ONCE DAILY Copland, Spencer, MD Taking Active   ibuprofen (ADVIL) 800 MG tablet [id number] Yes TAKE ONE TABLET BY MOUTH EVERY 8 HOURS AS NEEDED Copland, Spencer, MD Taking Active   minoxidil (LONITEN) 2.5 MG tablet [id number] Yes Take 2.5 mg by mouth. [provider] Taking Active            Med Note Wendelyn Breslow Apr 29, 2021  2:45 PM) Started 04/22/21 [name], MD  Multiple Vitamin (MULTIVITAMIN) tablet [id number] Yes Take 1 tablet by mouth daily. [provider] Taking Active Self  Probiotic Product (PROBIOTIC PO) [id number] Yes Take 1 tablet by mouth daily. [provider] Taking Active             Patient Active Problem List   Diagnosis Date Noted   Acute right-sided low back pain without sciatica 01/15/2021   Osteopenia 02/24/2019   Diabetes mellitus type 2, diet-controlled ([place]) 06/23/2018   DIVERTICULOSIS OF COLON 12/28/2007   Hyperlipidemia 10/21/2007   Essential hypertension 10/21/2007   ALLERGIC RHINITIS 10/21/2007   Mild persistent asthma with acute exacerbation in adult 10/21/2007   GERD 10/21/2007   NEPHROLITHIASIS, HX OF 10/21/2007    Immunization History  Administered Date(s) Administered   Fluad Quad(high Dose 65+) 04/05/2019, 05/06/2021   Influenza Split 06/09/2011   Influenza Whole 06/04/2008, 06/12/2009, 06/05/2010   Influenza, High Dose Seasonal PF  05/07/2015, 05/12/2020   Influenza,inj,Quad PF,6+ Mos 07/05/2013, 05/10/2014, 05/09/2016, 05/18/2017, 05/20/2018   PFIZER(Purple  Top)SARS-COV-2 Vaccination 09/16/2019, 10/07/2019, 05/09/2020   Pneumococcal Conjugate-13 03/02/2014   Pneumococcal Polysaccharide-23 05/09/2016   Tdap 08/23/2012   Zoster, Live 07/11/2008    Conditions to be addressed/monitored:  Hypertension, Hyperlipidemia, and Diabetes  Care Plan : [place]  Updates made by [name], [place] since 09/03/2021 12:00 AM     Problem: CHL AMB "PATIENT-SPECIFIC PROBLEM"      Long-Range Goal: Disease Management   Start Date: 09/03/2021  This Visit's Progress: On track  Priority: High  Note:    Current Barriers:  None identified  Pharmacist Clinical Goal(s):  Patient will contact provider office for questions/concerns as evidenced notation of same in electronic health record through collaboration with PharmD and provider.   Interventions: 1:1 collaboration with Owens Loffler, MD regarding development and update of comprehensive plan of care as evidenced by provider attestation and co-signature Inter-disciplinary care team collaboration (see longitudinal plan of care) Comprehensive medication review performed; medication list updated in electronic medical record  Hypertension (BP goal <130/80) -Not ideally controlled, BP <140/90. We discussed ideal goal of < 130/80 given her health conditions/risk. Pt plans to resume exercise now that she is feeling better from recent cold symptoms. -Current treatment: Amlodipine 5 mg - 1 tablet daily - Appropriate, Effective, Safe, Accessible HCTZ 25 mg - 1 tablet daily - Appropriate, Effective, Safe, Accessible -Medications previously tried: none   -Current home readings: she checks BP this morning - 136/87 mmHg today -Current exercise habits: [employer] three times/week (has been off lately due to cold symptoms) -Denies hypotensive/hypertensive symptoms; No dizziness,  falls. -Educated on BP goals and benefits of medications for prevention of heart attack, stroke and kidney damage; -Counseled to monitor BP at home at least once monthly, document, and provide log at future appointments -Recommended to continue current medication; Resume exercise three days weekly.  Hyperlipidemia: (LDL goal < 70) -Not ideally controlled (Last LDL 83 07/2020) -Current treatment: Atorvastatin 10 mg - 1 tablet daily - Appropriate, Effective, Safe, Accessible -Medications previously tried: none   -Educated on Cholesterol goals;  -Recommended to continue current medication; Re-eval after annual labs.  Diabetes (A1c goal <7%) -Not ideally controlled (last A1c 6.2% 07/2020) -Current medications: None -Medications previously tried: none   -Current home glucose readings - she has not checked recently -Denies hypoglycemic/hyperglycemic symptoms -Current meal patterns: not limiting carbs recently, gained a few pounds this year -Educated on A1c and blood sugar goals; -Counseled to check feet daily and get yearly eye exams -Recommended to continue current medication; Re-eval after annual labs.   Patient Goals/Self-Care Activities Patient will:  - take medications as prescribed as evidenced by patient report and record review check blood pressure monthly or with symptoms, document, and provide at future appointments  Follow Up Plan: Telephone follow up appointment with care management team member scheduled for: - CCM PharmD follow up 12 months - [facility] pharmacy coordination every 90 days     Medication Assistance: None required.  Patient affirms current coverage meets needs.  Compliance/Adherence/Medication fill history: Care Gaps: Diabetes care - Foot exam, eye exam, A1c  Star-Rating Drugs: Medication:                Last Fill:         Day Supply Atorvastatin 18m       08/26/21          90   Patient's preferred pharmacy is: [facility] [place]  [address] [employer]  [address] [place] [address] Phone: [phone] Fax: [phone]  Uses pill box? No - patient meds are in pill packaging Pt endorses good compliance. She misses a dose on occasion due to schedule/busyness.  Care Plan and Follow Up Patient Decision:  Patient agrees to Care Plan and Follow-up.  [name], PharmD Clinical Pharmacist Practitioner [facility] at [facility] [phone]

## 2021-09-03 NOTE — Patient Instructions (Signed)
Dear Dana Hayes,  Below is a summary of the goals we discussed during our follow up appointment on September 03, 2021. Please contact me anytime with questions or concerns.   Visit Information Patient Care Plan: CCM Pharmacy Care Plan     Problem Identified: CHL AMB "PATIENT-SPECIFIC PROBLEM"      Long-Range Goal: Disease Management   Start Date: 09/03/2021  This Visit's Progress: On track  Priority: High  Note:    Current Barriers:  None identified  Pharmacist Clinical Goal(s):  Patient will contact provider office for questions/concerns as evidenced notation of same in electronic health record through collaboration with PharmD and provider.   Interventions: 1:1 collaboration with Owens Loffler, MD regarding development and update of comprehensive plan of care as evidenced by provider attestation and co-signature Inter-disciplinary care team collaboration (see longitudinal plan of care) Comprehensive medication review performed; medication list updated in electronic medical record  Hypertension (BP goal <130/80) -Not ideally controlled, BP <140/90. We discussed ideal goal of < 130/80 given her health conditions/risk. Pt plans to resume exercise now that she is feeling better from recent cold symptoms. -Current treatment: Amlodipine 5 mg - 1 tablet daily - Appropriate, Effective, Safe, Accessible HCTZ 25 mg - 1 tablet daily - Appropriate, Effective, Safe, Accessible -Medications previously tried: none   -Current home readings: she checks BP this morning - 136/87 mmHg today -Current exercise habits: [employer] three times/week (has been off lately due to cold symptoms) -Denies hypotensive/hypertensive symptoms; No dizziness, falls. -Educated on BP goals and benefits of medications for prevention of heart attack, stroke and kidney damage; -Counseled to monitor BP at home at least once monthly, document, and provide log at future appointments -Recommended to continue current  medication; Resume exercise three days weekly.  Hyperlipidemia: (LDL goal < 70) -Not ideally controlled (Last LDL 83 07/2020) -Current treatment: Atorvastatin 10 mg - 1 tablet daily - Appropriate, Effective, Safe, Accessible -Medications previously tried: none   -Educated on Cholesterol goals;  -Recommended to continue current medication; Re-eval after annual labs.  Diabetes (A1c goal <7%) -Not ideally controlled (last A1c 6.2% 07/2020) -Current medications: None -Medications previously tried: none   -Current home glucose readings - she has not checked recently -Denies hypoglycemic/hyperglycemic symptoms -Current meal patterns: not limiting carbs recently, gained a few pounds this year -Educated on A1c and blood sugar goals; -Counseled to check feet daily and get yearly eye exams -Recommended to continue current medication; Re-eval after annual labs.   Patient Goals/Self-Care Activities Patient will:  - take medications as prescribed as evidenced by patient report and record review check blood pressure monthly or with symptoms, document, and provide at future appointments  Follow Up Plan: Telephone follow up appointment with care management team member scheduled for: - CCM PharmD follow up 12 months - [facility] pharmacy coordination every 90 days      Patient verbalizes understanding of instructions and care plan provided today and agrees to view in [place]. Active MyChart status confirmed with patient.    [name], PharmD Clinical Pharmacist Practitioner [facility] at [facility] [phone]

## 2021-09-09 ENCOUNTER — Encounter: Payer: PPO | Admitting: Family Medicine

## 2021-09-13 ENCOUNTER — Ambulatory Visit (INDEPENDENT_AMBULATORY_CARE_PROVIDER_SITE_OTHER): Payer: PPO | Admitting: Family Medicine

## 2021-09-13 ENCOUNTER — Encounter: Payer: Self-pay | Admitting: Family Medicine

## 2021-09-13 ENCOUNTER — Other Ambulatory Visit: Payer: Self-pay

## 2021-09-13 VITALS — BP 130/70 | HR 107 | Temp 98.2°F | Ht 61.0 in | Wt 167.0 lb

## 2021-09-13 DIAGNOSIS — L659 Nonscarring hair loss, unspecified: Secondary | ICD-10-CM | POA: Insufficient documentation

## 2021-09-13 DIAGNOSIS — I10 Essential (primary) hypertension: Secondary | ICD-10-CM | POA: Diagnosis not present

## 2021-09-13 DIAGNOSIS — J4520 Mild intermittent asthma, uncomplicated: Secondary | ICD-10-CM

## 2021-09-13 DIAGNOSIS — E782 Mixed hyperlipidemia: Secondary | ICD-10-CM | POA: Diagnosis not present

## 2021-09-13 DIAGNOSIS — E119 Type 2 diabetes mellitus without complications: Secondary | ICD-10-CM | POA: Diagnosis not present

## 2021-09-13 LAB — POCT GLYCOSYLATED HEMOGLOBIN (HGB A1C): Hemoglobin A1C: 7.8 % — AB (ref 4.0–5.6)

## 2021-09-13 MED ORDER — ALBUTEROL SULFATE HFA 108 (90 BASE) MCG/ACT IN AERS: 2.0000 | INHALATION_SPRAY | Freq: Four times a day (QID) | RESPIRATORY_TRACT | 2 refills | Status: AC | PRN

## 2021-09-13 NOTE — Assessment & Plan Note (Signed)
Worse. Would like to work on diet/exercise for 3 months. Return in 3 months. If still elevated will need to start medication. Cont atorvastatin.

## 2021-09-13 NOTE — Patient Instructions (Addendum)
Diabetes - Check out "The Glucose Revolution" by Lurena Nida

## 2021-09-13 NOTE — Assessment & Plan Note (Signed)
Improved on minoxidil.

## 2021-09-13 NOTE — Progress Notes (Signed)
Subjective:     Dana Hayes is a 76 y.o. female presenting for Transitions Of Care     HPI  #Diabetes Currently taking nothing  Using medications without difficulties: No Hypoglycemic episodes:No  Hyperglycemic episodes:No  Feet problems:No  Blood Sugars averaging: does not check Last HgbA1c:  Lab Results  Component Value Date   HGBA1C 7.8 (A) 09/13/2021   Had always been diet controled Not doing as good with diet/exercise with the holidays and illness Has returned to the gym and doing healthy  Diabetes Health Maintenance Due:    Diabetes Health Maintenance Due  Topic Date Due   OPHTHALMOLOGY EXAM  10/02/2020   HEMOGLOBIN A1C  03/13/2022   FOOT EXAM  09/13/2022    #HTN - doing well - on amlodipine and hctz  #Asthma - nagging cough  - using albuterol prn   Review of Systems   Social History   Tobacco Use  Smoking Status Never  Smokeless Tobacco Never        Objective:    BP Readings from Last 3 Encounters:  09/13/21 130/70  01/15/21 (!) 152/82  08/02/20 140/70   Wt Readings from Last 3 Encounters:  09/13/21 167 lb (75.8 kg)  01/15/21 162 lb (73.5 kg)  12/27/20 159 lb (72.1 kg)    BP 130/70    Pulse (!) 107    Temp 98.2 F (36.8 C) (Oral)    Ht 5\' 1"  (1.549 m)    Wt 167 lb (75.8 kg)    SpO2 97%    BMI 31.55 kg/m    Physical Exam Constitutional:      General: She is not in acute distress.    Appearance: She is well-developed. She is not diaphoretic.  HENT:     Right Ear: External ear normal.     Left Ear: External ear normal.  Eyes:     Conjunctiva/sclera: Conjunctivae normal.  Cardiovascular:     Rate and Rhythm: Normal rate and regular rhythm.  Pulmonary:     Effort: Pulmonary effort is normal. No respiratory distress.     Breath sounds: Normal breath sounds. No wheezing.  Musculoskeletal:     Cervical back: Neck supple.  Skin:    General: Skin is warm and dry.     Capillary Refill: Capillary refill takes less than 2  seconds.  Neurological:     Mental Status: She is alert. Mental status is at baseline.  Psychiatric:        Mood and Affect: Mood normal.        Behavior: Behavior normal.          Assessment & Plan:   Problem List Items Addressed This Visit       Cardiovascular and Mediastinum   Essential hypertension (Chronic)    Controlled. Cont amlodipine 5 mg and hctz 25 mg. Allergy to ACE-I. Will get labs at annual in 3 months        Respiratory   Mild intermittent asthma    Controlled. Refill inhaler for prn use      Relevant Medications   albuterol (VENTOLIN HFA) 108 (90 Base) MCG/ACT inhaler     Endocrine   Diabetes mellitus type 2, diet-controlled (HCC) - Primary (Chronic)    Worse. Would like to work on diet/exercise for 3 months. Return in 3 months. If still elevated will need to start medication. Cont atorvastatin.       Relevant Orders   POCT glycosylated hemoglobin (Hb A1C) (Completed)     Other  Hyperlipidemia (Chronic)    Lab Results  Component Value Date   CHOL 164 07/20/2020   HDL 58.10 07/20/2020   LDLCALC 83 07/20/2020   LDLDIRECT 162.6 05/29/2010   TRIG 113.0 07/20/2020   CHOLHDL 3 07/20/2020  Stable. Cont atorvastatin fasting labs in 3 months      Hair loss    Improved on minoxidil.         Return in about 3 months (around 12/11/2021) for medicare wellness - health nurse first.  Lesleigh Noe, MD  This visit occurred during the SARS-CoV-2 public health emergency.  Safety protocols were in place, including screening questions prior to the visit, additional usage of staff PPE, and extensive cleaning of exam room while observing appropriate contact time as indicated for disinfecting solutions.

## 2021-09-13 NOTE — Assessment & Plan Note (Signed)
Lab Results  Component Value Date   CHOL 164 07/20/2020   HDL 58.10 07/20/2020   LDLCALC 83 07/20/2020   LDLDIRECT 162.6 05/29/2010   TRIG 113.0 07/20/2020   CHOLHDL 3 07/20/2020   Stable. Cont atorvastatin fasting labs in 3 months

## 2021-09-13 NOTE — Assessment & Plan Note (Signed)
Controlled. Cont amlodipine 5 mg and hctz 25 mg. Allergy to ACE-I. Will get labs at annual in 3 months

## 2021-09-13 NOTE — Assessment & Plan Note (Signed)
Controlled. Refill inhaler for prn use

## 2021-10-07 ENCOUNTER — Encounter: Payer: Self-pay | Admitting: Family Medicine

## 2021-10-07 ENCOUNTER — Telehealth (INDEPENDENT_AMBULATORY_CARE_PROVIDER_SITE_OTHER): Payer: PPO | Admitting: Family Medicine

## 2021-10-07 ENCOUNTER — Other Ambulatory Visit: Payer: Self-pay

## 2021-10-07 VITALS — BP 139/82 | HR 89 | Temp 98.9°F

## 2021-10-07 DIAGNOSIS — J069 Acute upper respiratory infection, unspecified: Secondary | ICD-10-CM | POA: Diagnosis not present

## 2021-10-07 MED ORDER — PROMETHAZINE-DM 6.25-15 MG/5ML PO SYRP: 5.0000 mL | ORAL_SOLUTION | Freq: Four times a day (QID) | ORAL | 0 refills | Status: DC | PRN

## 2021-10-07 NOTE — Progress Notes (Signed)
? ? ?  I connected with Dana Hayes on 10/07/21 at  2:00 PM EST by video and verified that I am speaking with the correct person using two identifiers. ?  ?I discussed the limitations, risks, security and privacy concerns of performing an evaluation and management service by video and the availability of in person appointments. I also discussed with the patient that there may be a patient responsible charge related to this service. The patient expressed understanding and agreed to proceed. ? ?Patient location: Home ?Provider Location: [facility] ?Participants: Dana Hayes and Dana Hayes ? ? ?Subjective:  ? ?  ?Dana Hayes is a 76 y.o. female presenting for Cough (X 3 days. Neg. Covid test yesterday ), Nasal Congestion (Green and yellow mucous. Pt is using neti pot), and Ear Fullness ?  ? ? ?Cough ?This is a new problem. Episode onset: 10/04/2021. The cough is Productive of sputum. Associated symptoms include ear congestion, ear pain, headaches, nasal congestion, postnasal drip and a sore throat. Pertinent negatives include no chills, fever, myalgias, shortness of breath or wheezing. Nothing aggravates the symptoms. Treatments tried: neti pot, mucinex. The treatment provided significant relief.  ?Ear Fullness  ?Associated symptoms include coughing, headaches and a sore throat. Pertinent negatives include no diarrhea or vomiting.  ? ?Started Friday night and worse Saturday/Sunday  ? ?Review of Systems  ?Constitutional:  Negative for chills and fever.  ?HENT:  Positive for ear pain, postnasal drip, sinus pressure, sinus pain, sore throat and voice change (difficulty talking).   ?Respiratory:  Positive for cough. Negative for shortness of breath and wheezing.   ?Gastrointestinal:  Negative for diarrhea, nausea and vomiting.  ?Musculoskeletal:  Negative for arthralgias and myalgias.  ?Neurological:  Positive for headaches.  ? ? ?Social History  ? ?Tobacco Use  ?Smoking Status Never  ?Smokeless Tobacco  Never  ? ? ? ?   ?Objective:  ? ?BP Readings from Last 3 Encounters:  ?10/07/21 139/82  ?09/13/21 130/70  ?01/15/21 (!) 152/82  ? ?Wt Readings from Last 3 Encounters:  ?09/13/21 167 lb (75.8 kg)  ?01/15/21 162 lb (73.5 kg)  ?12/27/20 159 lb (72.1 kg)  ? ? ?Physical Exam ?Constitutional:   ?   Appearance: Normal appearance. She is not ill-appearing.  ?HENT:  ?   Head: Normocephalic and atraumatic.  ?   Right Ear: External ear normal.  ?   Left Ear: External ear normal.  ?Eyes:  ?   Conjunctiva/sclera: Conjunctivae normal.  ?Pulmonary:  ?   Effort: Pulmonary effort is normal. No respiratory distress.  ?Neurological:  ?   Mental Status: She is alert. Mental status is at baseline.  ?Psychiatric:     ?   Mood and Affect: Mood normal.     ?   Behavior: Behavior normal.     ?   Thought Content: Thought content normal.     ?   Judgment: Judgment normal.  ? ? ? ? ? ? ?   ?Assessment & Plan:  ? ?Problem List Items Addressed This Visit   ?None ?Visit Diagnoses   ? ? Viral URI with cough    -  Primary  ? Relevant Medications  ? promethazine-dextromethorphan (PROMETHAZINE-DM) 6.25-15 MG/5ML syrup  ? ?  ? ?Continue neti pot and mucinex ?Prescription for nighttime cough for prn ?Call if not improved by the end of the week for consideration for abx ? ? ?Return if symptoms worsen or fail to improve. ? ?Dana Noe, MD ? ?

## 2021-11-08 ENCOUNTER — Other Ambulatory Visit: Payer: Self-pay | Admitting: Family Medicine

## 2021-11-11 ENCOUNTER — Telehealth: Payer: Self-pay

## 2021-11-11 NOTE — Chronic Care Management (AMB) (Signed)
? ? ?Chronic Care Management ?Pharmacy Assistant  ? ?Name: Dana Hayes  MRN: [id number] DOB: Dec 22, 1945 ? ? ?Reason for Encounter: Medication Adherence and Delivery Coordination  ?  ? ?Recent office visits:  ?10/07/21- PCP- Janett Billow Cody,MD- Telemedicine- cough-Start promethazine -DM 6.25-'15mg'$  syrup- use at nighttime as needed for cough.Continue neti pot and mucinex ?09/13/21-Family Medicine- Janett Billow Cody,MD-TOC-diet controlled diabetes.UTD foot and eye exams-Refill inhaler for prn use. Follow up 3 months with new labs.  ? ?Recent consult visits:  ?None since last CCM contact ? ?Hospital visits:  ?None in previous 6 months ? ?Medications: ?Outpatient Encounter Medications as of 11/11/2021  ?Medication Sig Note  ? albuterol (VENTOLIN HFA) 108 (90 Base) MCG/ACT inhaler Inhale 2 puffs into the lungs every 6 (six) hours as needed for wheezing or shortness of breath.   ? amLODipine (NORVASC) 5 MG tablet TAKE ONE TABLET BY MOUTH ONCE DAILY   ? atorvastatin (LIPITOR) 10 MG tablet TAKE ONE TABLET BY MOUTH ONCE DAILY   ? cetirizine (ZYRTEC) 10 MG tablet Take 10 mg daily by mouth.   ? cholecalciferol (VITAMIN D) 1000 UNITS tablet Take 1,000 Units by mouth daily.   ? Dextromethorphan-guaiFENesin (MUCINEX DM MAXIMUM STRENGTH) 60-1200 MG TB12 Take 1 tablet by mouth as needed.   ? esomeprazole (NEXIUM) 40 MG capsule Take 40 mg by mouth daily as needed.   ? hydrochlorothiazide (HYDRODIURIL) 25 MG tablet TAKE ONE TABLET BY MOUTH ONCE DAILY   ? ibuprofen (ADVIL) 800 MG tablet TAKE ONE TABLET BY MOUTH EVERY 8 HOURS AS NEEDED   ? minoxidil (LONITEN) 2.5 MG tablet Take 2.5 mg by mouth. 04/29/2021: Started 04/22/21 [name], MD  ? Multiple Vitamin (MULTIVITAMIN) tablet Take 1 tablet by mouth daily.   ? Probiotic Product (PROBIOTIC PO) Take 1 tablet by mouth daily.   ? promethazine-dextromethorphan (PROMETHAZINE-DM) 6.25-15 MG/5ML syrup Take 5 mLs by mouth 4 (four) times daily as needed for cough.   ? ?Facility-Administered Encounter  Medications as of 11/11/2021  ?Medication  ? albuterol (PROVENTIL) (2.5 MG/3ML) 0.083% nebulizer solution 2.5 mg  ? ?BP Readings from Last 3 Encounters:  ?10/07/21 139/82  ?09/13/21 130/70  ?01/15/21 (!) 152/82  ?  ?Lab Results  ?Component Value Date  ? HGBA1C 7.8 (A) 09/13/2021  ?  ? ? ?Recent OV, Consult or Hospital visit:  ?No medication changes indicated ? ? ?Last adherence delivery date:08/22/21     ? ?Patient is due for next adherence delivery on: 11/21/21 ? ?Spoke with patient on 11/11/21 reviewed medications and coordinated delivery. ? ?This delivery to include: Adherence Packaging  90 Days  ?Packs: ?Hydrochlorothiazide 25 mg - 1 tablet daily(breakfast) ?Atorvastatin 10 mg - 1 tablet daily(breakfast) ?Amlodipine 5 mg - 1 tablet daily(breakfast) ? ?VIAL medications: ?Ibuprofen 800 mg - take 1 tablet every 8 hours as needed ?Minoxidil 2.5 mg - take 1/2 tablet daily  ?Albuterol Inhaler- 2 puffs every 6 hours as needed ? ? ?Patient declined the following medications this month: ?none ? ? ?Any concerns about your medications? No ? ?How often do you forget or accidentally miss a dose? Never ? ?Do you use a pillbox? No ? ?Is patient in packaging Yes ? What is the date on your next pill pack? Not near packs to read date  ? Any concerns or issues with your packaging? Happy with the packs process ? ? ?Refills requested from providers include:HCTZ, amlodipine,atorvastatin  ? ?Confirmed delivery date of 11/21/21 , advised patient that pharmacy will contact them the morning of delivery. ? ?Recent  blood pressure readings are as follows:  10/07/21   139/82  home reading ? ? ?Annual wellness visit in last year? No- scheduled for 12/09/21 ?Most Recent BP reading:130/70  107-P  09/13/21 ? ? ?Charlene Brooke, CPP notified ? ?Leevi Cullars, CCMA ?Health concierge  ?[phone]  ?

## 2021-12-02 LAB — HM DIABETES EYE EXAM

## 2021-12-09 ENCOUNTER — Ambulatory Visit (INDEPENDENT_AMBULATORY_CARE_PROVIDER_SITE_OTHER): Payer: PPO

## 2021-12-09 VITALS — Ht 61.0 in | Wt 165.0 lb

## 2021-12-09 DIAGNOSIS — Z0000 Encounter for general adult medical examination without abnormal findings: Secondary | ICD-10-CM

## 2021-12-09 NOTE — Progress Notes (Signed)
? ?Subjective:  ? Dana Hayes is a 76 y.o. female who presents for Medicare Annual (Subsequent) preventive examination. ?Virtual Visit via Telephone Note ? ?I connected with  Dana Hayes on 12/09/21 at  9:45 AM EDT by telephone and verified that I am speaking with the correct person using two identifiers. ? ?Location: ?Patient: HOME ?Provider: [facility] ?Persons participating in the virtual visit: patient/Nurse Health Advisor ?  ?I discussed the limitations, risks, security and privacy concerns of performing an evaluation and management service by telephone and the availability of in person appointments. The patient expressed understanding and agreed to proceed. ? ?Interactive audio and video telecommunications were attempted between this nurse and patient, however failed, due to patient having technical difficulties OR patient did not have access to video capability.  We continued and completed visit with audio only. ? ?Some vital signs may be absent or patient reported.  ? ?Chriss Driver, LPN ? ?Review of Systems    ?Cardiac Risk Factors include: advanced age (>22mn, >>59women);hypertension;diabetes mellitus;dyslipidemia;sedentary lifestyle;obesity (BMI >30kg/m2) ? ?   ?Objective:  ?  ?Today's Vitals  ? 12/09/21 0950  ?Weight: 165 lb (74.8 kg)  ?Height: '5\' 1"'$  (1.549 m)  ? ?Body mass index is 31.18 kg/m?. ? ? ?  12/09/2021  ?  9:56 AM 07/24/2020  ? 10:40 AM 07/19/2019  ?  2:43 PM 04/26/2019  ? 10:00 AM 06/17/2018  ?  9:52 AM 06/15/2017  ?  1:28 PM 05/18/2017  ?  2:54 PM  ?Advanced Directives  ?Does Patient Have a Medical Advance Directive? Yes Yes Yes Yes Yes Yes Yes  ?Type of AParamedicof [place]rad[place]Living will [place]Living will [place]Living will  [place] HopLiving will  [place]Living will  ?Copy of [place]? No - copy requested No - copy requested No - copy requested  No  - copy requested  No - copy requested  ?Would patient like information on creating a medical advance directive? No - Patient declined        ? ? ?Current Medications (verified) ?Outpatient Encounter Medications as of 12/09/2021  ?Medication Sig  ? albuterol (VENTOLIN HFA) 108 (90 Base) MCG/ACT inhaler Inhale 2 puffs into the lungs every 6 (six) hours as needed for wheezing or shortness of breath.  ? amLODipine (NORVASC) 5 MG tablet TAKE ONE TABLET BY MOUTH ONCE DAILY  ? atorvastatin (LIPITOR) 10 MG tablet TAKE ONE TABLET BY MOUTH ONCE DAILY  ? cetirizine (ZYRTEC) 10 MG tablet Take 10 mg daily by mouth.  ? cholecalciferol (VITAMIN D) 1000 UNITS tablet Take 1,000 Units by mouth daily.  ? Dextromethorphan-guaiFENesin (MUCINEX DM MAXIMUM STRENGTH) 60-1200 MG TB12 Take 1 tablet by mouth as needed.  ? esomeprazole (NEXIUM) 40 MG capsule Take 40 mg by mouth daily as needed.  ? hydrochlorothiazide (HYDRODIURIL) 25 MG tablet TAKE ONE TABLET BY MOUTH ONCE DAILY  ? ibuprofen (ADVIL) 800 MG tablet TAKE ONE TABLET BY MOUTH EVERY 8 HOURS AS NEEDED  ? minoxidil (LONITEN) 2.5 MG tablet Take 2.5 mg by mouth.  ? Multiple Vitamin (MULTIVITAMIN) tablet Take 1 tablet by mouth daily.  ? promethazine-dextromethorphan (PROMETHAZINE-DM) 6.25-15 MG/5ML syrup Take 5 mLs by mouth 4 (four) times daily as needed for cough.  ? [DISCONTINUED] Probiotic Product (PROBIOTIC PO) Take 1 tablet by mouth daily.  ? ?Facility-Administered Encounter Medications as of 12/09/2021  ?Medication  ? albuterol (PROVENTIL) (2.5 MG/3ML) 0.083% nebulizer solution 2.5  mg  ? ? ?Allergies (verified) ?Codeine, Ace inhibitors, and Estradiol  ? ?History: ?Past Medical History:  ?Diagnosis Date  ? Allergy   ? Asthma   ? GERD (gastroesophageal reflux disease)   ? History of kidney stones   ? Hyperlipidemia   ? Hypertension   ? Obesity   ? ?Past Surgical History:  ?Procedure Laterality Date  ? ABDOMINAL HYSTERECTOMY    ? COLONOSCOPY WITH PROPOFOL N/A 04/26/2019  ? Procedure:  COLONOSCOPY WITH PROPOFOL;  Surgeon: Jonathon Bellows, MD;  Location: [facility] ENDOSCOPY;  Service: Gastroenterology;  Laterality: N/A;  ? LITHOTRIPSY    ?  TIMES 2 1990  ? ?Family History  ?Problem Relation Age of Onset  ? Coronary artery disease Mother   ? Diabetes Mother   ? Heart disease Mother   ? Coronary artery disease Brother   ? Heart attack Brother   ? Liver cancer Brother   ? Breast cancer Maternal Aunt   ?     early 20s  ? ?Social History  ? ?Socioeconomic History  ? Marital status: Married  ?  Spouse name: Dana Hayes  ? Number of children: 2  ? Years of education: some college  ? Highest education level: Not on file  ?Occupational History  ? Occupation: buyer  ?  Employer: [employer]  ?Tobacco Use  ? Smoking status: Never  ? Smokeless tobacco: Never  ?Vaping Use  ? Vaping Use: Never used  ?Substance and Sexual Activity  ? Alcohol use: Not Currently  ? Drug use: No  ? Sexual activity: Not Currently  ?Other Topics Concern  ? Not on file  ?Social History Narrative  ? REGULAR EXERCISE: YES- WALKS 3-4-DAYS A WEEKDiet: no fast food, instead veggies, loves baking  ?   ? 09/13/21  ? From: the area  ? Living: with Dana Hayes, husband [phone])  ? Work: retired - Armed forces logistics/support/administrative officer  ?   ? Family: Dana Hayes and Dana Hayes - 5 grandchildren, 3 great-grandchildren  ?   ? Enjoys: spend time with family, yard work - flowers  ?   ? Exercise: gym - has a bike, pool in the summer  ? Diet: working on diabetic diet  ?   ? Safety  ? Seat belts: Yes   ? Guns: Yes  and secure  ? Safe in relationships: Yes   ?   ? ?Social Determinants of Health  ? ?Financial Resource Strain: Low Risk   ? Difficulty of Paying Living Expenses: Not hard at all  ?Food Insecurity: No Food Insecurity  ? Worried About Charity fundraiser in the Last Year: Never true  ? Ran Out of Food in the Last Year: Never true  ?Transportation Needs: No Transportation Needs  ? Lack of Transportation (Medical): No  ? Lack of Transportation (Non-Medical): No  ?Physical  Activity: Sufficiently Active  ? Days of Exercise per Week: 5 days  ? Minutes of Exercise per Session: 30 min  ?Stress: No Stress Concern Present  ? Feeling of Stress : Not at all  ?Social Connections: Socially Integrated  ? Frequency of Communication with Friends and Family: More than three times a week  ? Frequency of Social Gatherings with Friends and Family: More than three times a week  ? Attends Religious Services: More than 4 times per year  ? Active Member of Clubs or Organizations: Yes  ? Attends Archivist Meetings: More than 4 times per year  ? Marital Status: Married  ? ? ?Tobacco Counseling ?Counseling given:  Not Answered ? ? ?Clinical Intake: ? ?Pre-visit preparation completed: Yes ? ?Pain : No/denies pain ? ?  ? ?BMI - recorded: 31.18 ?Nutritional Status: BMI > 30  Obese ?Nutritional Risks: None ?Diabetes: No ? ?How often do you need to have someone help you when you read instructions, pamphlets, or other written materials from your doctor or pharmacy?: 1 - Never ? ?Diabetic?NO ? ?Interpreter Needed?: No ? ?Information entered by :: mj Mistie Adney, lpn ? ? ?Activities of Daily Living ? ?  12/09/2021  ? 10:00 AM  ?In your present state of health, do you have any difficulty performing the following activities:  ?Hearing? 0  ?Vision? 0  ?Difficulty concentrating or making decisions? 0  ?Walking or climbing stairs? 0  ?Dressing or bathing? 0  ?Doing errands, shopping? 0  ?Preparing Food and eating ? N  ?Using the Toilet? N  ?In the past six months, have you accidently leaked urine? N  ?Do you have problems with loss of bowel control? N  ?Managing your Medications? N  ?Managing your Finances? N  ?Housekeeping or managing your Housekeeping? N  ? ? ?Patient Care Team: ?Lesleigh Noe, MD as PCP - General (Family Medicine) ?Oneta Rack, MD as Consulting Physician (Dermatology) ?[name], [facility] as Pharmacist (Pharmacist) ? ?Indicate any recent Medical Services you may have received from other  than [facility] providers in the past year (date may be approximate). ? ?   ?Assessment:  ? This is a routine wellness examination for [facility]. ? ?Hearing/Vision screen ?Hearing Screening - Comments:: No hearing iss

## 2021-12-09 NOTE — Patient Instructions (Signed)
Ms. Oshana , ?Thank you for taking time to come for your Medicare Wellness Visit. I appreciate your ongoing commitment to your health goals. Please review the following plan we discussed and let me know if I can assist you in the future.  ? ?Screening recommendations/referrals: ?Colonoscopy: Done 04/26/2019 Repeat in 10 years ? ?Mammogram: Done 07/15/2021 Repeat annually ? ?Bone Density: Done 06/24/2016 Repeat every 2 years ? ?Recommended yearly ophthalmology/optometry visit for glaucoma screening and checkup ?Recommended yearly dental visit for hygiene and checkup ? ?Vaccinations: ?Influenza vaccine: Done 05/06/2021 Repeat annually ? ?Pneumococcal vaccine: Done 03/02/2014 and 05/09/2016  ?Tdap vaccine: Done 08/23/2012 Repeat in 10 years ? ?Shingles vaccine: Done 07/11/2008. Discussed Shingrix.   ?Covid-19:Done 05/09/2020, 10/07/2019 and 09/16/2019. ? ?Advanced directives: Please bring a copy of your health care power of attorney and living will to the office to be added to your chart at your convenience. ? ? ?Conditions/risks identified: Aim for 30 minutes of exercise or brisk walking, 6-8 glasses of water, and 5 servings of fruits and vegetables each day. ? ? ?Next appointment: Follow up in one year for your annual wellness visit 2024, phone visit.  ? ? ?Preventive Care 80 Years and Older, Female ?Preventive care refers to lifestyle choices and visits with your health care provider that can promote health and wellness. ?What does preventive care include? ?A yearly physical exam. This is also called an annual well check. ?Dental exams once or twice a year. ?Routine eye exams. Ask your health care provider how often you should have your eyes checked. ?Personal lifestyle choices, including: ?Daily care of your teeth and gums. ?Regular physical activity. ?Eating a healthy diet. ?Avoiding tobacco and drug use. ?Limiting alcohol use. ?Practicing safe sex. ?Taking low-dose aspirin every day. ?Taking vitamin and mineral supplements  as recommended by your health care provider. ?What happens during an annual well check? ?The services and screenings done by your health care provider during your annual well check will depend on your age, overall health, lifestyle risk factors, and family history of disease. ?Counseling  ?Your health care provider may ask you questions about your: ?Alcohol use. ?Tobacco use. ?Drug use. ?Emotional well-being. ?Home and relationship well-being. ?Sexual activity. ?Eating habits. ?History of falls. ?Memory and ability to understand (cognition). ?Work and work Statistician. ?Reproductive health. ?Screening  ?You may have the following tests or measurements: ?Height, weight, and BMI. ?Blood pressure. ?Lipid and cholesterol levels. These may be checked every 5 years, or more frequently if you are over 52 years old. ?Skin check. ?Lung cancer screening. You may have this screening every year starting at age 73 if you have a 30-pack-year history of smoking and currently smoke or have quit within the past 15 years. ?Fecal occult blood test (FOBT) of the stool. You may have this test every year starting at age 39. ?Flexible sigmoidoscopy or colonoscopy. You may have a sigmoidoscopy every 5 years or a colonoscopy every 10 years starting at age 25. ?Hepatitis C blood test. ?Hepatitis B blood test. ?Sexually transmitted disease (STD) testing. ?Diabetes screening. This is done by checking your blood sugar (glucose) after you have not eaten for a while (fasting). You may have this done every 1-3 years. ?Bone density scan. This is done to screen for osteoporosis. You may have this done starting at age 21. ?Mammogram. This may be done every 1-2 years. Talk to your health care provider about how often you should have regular mammograms. ?Talk with your health care provider about your test results, treatment options,  and if necessary, the need for more tests. ?Vaccines  ?Your health care provider may recommend certain vaccines, such  as: ?Influenza vaccine. This is recommended every year. ?Tetanus, diphtheria, and acellular pertussis (Tdap, Td) vaccine. You may need a Td booster every 10 years. ?Zoster vaccine. You may need this after age 49. ?Pneumococcal 13-valent conjugate (PCV13) vaccine. One dose is recommended after age 56. ?Pneumococcal polysaccharide (PPSV23) vaccine. One dose is recommended after age 72. ?Talk to your health care provider about which screenings and vaccines you need and how often you need them. ?This information is not intended to replace advice given to you by your health care provider. Make sure you discuss any questions you have with your health care provider. ?Document Released: 08/17/2015 Document Revised: 04/09/2016 Document Reviewed: 05/22/2015 ?Elsevier Interactive Patient Education ? 2017 [employer]. ? ?Fall Prevention in the Home ?Falls can cause injuries. They can happen to people of all ages. There are many things you can do to make your home safe and to help prevent falls. ?What can I do on the outside of my home? ?Regularly fix the edges of walkways and driveways and fix any cracks. ?Remove anything that might make you trip as you walk through a door, such as a raised step or threshold. ?Trim any bushes or trees on the path to your home. ?Use bright outdoor lighting. ?Clear any walking paths of anything that might make someone trip, such as rocks or tools. ?Regularly check to see if handrails are loose or broken. Make sure that both sides of any steps have handrails. ?Any raised decks and porches should have guardrails on the edges. ?Have any leaves, snow, or ice cleared regularly. ?Use sand or salt on walking paths during winter. ?Clean up any spills in your garage right away. This includes oil or grease spills. ?What can I do in the bathroom? ?Use night lights. ?Install grab bars by the toilet and in the tub and shower. Do not use towel bars as grab bars. ?Use non-skid mats or decals in the tub or  shower. ?If you need to sit down in the shower, use a plastic, non-slip stool. ?Keep the floor dry. Clean up any water that spills on the floor as soon as it happens. ?Remove soap buildup in the tub or shower regularly. ?Attach bath mats securely with double-sided non-slip rug tape. ?Do not have throw rugs and other things on the floor that can make you trip. ?What can I do in the bedroom? ?Use night lights. ?Make sure that you have a light by your bed that is easy to reach. ?Do not use any sheets or blankets that are too big for your bed. They should not hang down onto the floor. ?Have a firm chair that has side arms. You can use this for support while you get dressed. ?Do not have throw rugs and other things on the floor that can make you trip. ?What can I do in the kitchen? ?Clean up any spills right away. ?Avoid walking on wet floors. ?Keep items that you use a lot in easy-to-reach places. ?If you need to reach something above you, use a strong step stool that has a grab bar. ?Keep electrical cords out of the way. ?Do not use floor polish or wax that makes floors slippery. If you must use wax, use non-skid floor wax. ?Do not have throw rugs and other things on the floor that can make you trip. ?What can I do with my stairs? ?Do not leave  any items on the stairs. ?Make sure that there are handrails on both sides of the stairs and use them. Fix handrails that are broken or loose. Make sure that handrails are as long as the stairways. ?Check any carpeting to make sure that it is firmly attached to the stairs. Fix any carpet that is loose or worn. ?Avoid having throw rugs at the top or bottom of the stairs. If you do have throw rugs, attach them to the floor with carpet tape. ?Make sure that you have a light switch at the top of the stairs and the bottom of the stairs. If you do not have them, ask someone to add them for you. ?What else can I do to help prevent falls? ?Wear shoes that: ?Do not have high heels. ?Have  rubber bottoms. ?Are comfortable and fit you well. ?Are closed at the toe. Do not wear sandals. ?If you use a stepladder: ?Make sure that it is fully opened. Do not climb a closed stepladder. ?Make sure tha

## 2021-12-13 ENCOUNTER — Ambulatory Visit (INDEPENDENT_AMBULATORY_CARE_PROVIDER_SITE_OTHER): Payer: PPO | Admitting: Family

## 2021-12-13 ENCOUNTER — Encounter: Payer: Self-pay | Admitting: Family

## 2021-12-13 VITALS — BP 154/78 | HR 94 | Temp 98.1°F | Resp 16 | Ht 61.0 in | Wt 166.6 lb

## 2021-12-13 DIAGNOSIS — R300 Dysuria: Secondary | ICD-10-CM | POA: Insufficient documentation

## 2021-12-13 DIAGNOSIS — R350 Frequency of micturition: Secondary | ICD-10-CM | POA: Diagnosis not present

## 2021-12-13 DIAGNOSIS — N3000 Acute cystitis without hematuria: Secondary | ICD-10-CM | POA: Insufficient documentation

## 2021-12-13 LAB — POC URINALSYSI DIPSTICK (AUTOMATED)
Bilirubin, UA: NEGATIVE
Blood, UA: NEGATIVE
Glucose, UA: NEGATIVE
Ketones, UA: NEGATIVE
Nitrite, UA: NEGATIVE
Protein, UA: NEGATIVE
Spec Grav, UA: 1.02 (ref 1.010–1.025)
Urobilinogen, UA: 0.2 E.U./dL
pH, UA: 5.5 (ref 5.0–8.0)

## 2021-12-13 MED ORDER — PHENAZOPYRIDINE HCL 200 MG PO TABS: 200.0000 mg | ORAL_TABLET | Freq: Three times a day (TID) | ORAL | 0 refills | Status: AC | PRN

## 2021-12-13 MED ORDER — SULFAMETHOXAZOLE-TRIMETHOPRIM 800-160 MG PO TABS: 1.0000 | ORAL_TABLET | Freq: Two times a day (BID) | ORAL | 0 refills | Status: AC

## 2021-12-13 NOTE — Assessment & Plan Note (Signed)
poct urine dipstick ?

## 2021-12-13 NOTE — Progress Notes (Signed)
? ?Established Patient Office Visit ? ?Subjective:  ?Patient ID: Dana Hayes, female    DOB: 25-Aug-1945  Age: 76 y.o. MRN: [id number] ? ?CC:  ?Chief Complaint  ?Patient presents with  ? Urinary Frequency  ?  Started this am Burning and urinary frequency.  ? ? ?HPI ?Dana Hayes is here today with concerns.  ? ?Started with urinary frequency, urgency, and dysuria.  ?No fever no chills.  ?Pelvic tenderness.  ?Slight lower back pain, denies flank pain. ? ?Has h/o kidney stones, however no current pain with this.  ?No vaginal discharge. ? ?Past Medical History:  ?Diagnosis Date  ? Allergy   ? Asthma   ? GERD (gastroesophageal reflux disease)   ? History of kidney stones   ? Hyperlipidemia   ? Hypertension   ? Obesity   ? ? ?Past Surgical History:  ?Procedure Laterality Date  ? ABDOMINAL HYSTERECTOMY    ? COLONOSCOPY WITH PROPOFOL N/A 04/26/2019  ? Procedure: COLONOSCOPY WITH PROPOFOL;  Surgeon: Jonathon Bellows, MD;  Location: [facility] ENDOSCOPY;  Service: Gastroenterology;  Laterality: N/A;  ? LITHOTRIPSY    ?  TIMES 2 1990  ? ? ?Family History  ?Problem Relation Age of Onset  ? Coronary artery disease Mother   ? Diabetes Mother   ? Heart disease Mother   ? Coronary artery disease Brother   ? Heart attack Brother   ? Liver cancer Brother   ? Breast cancer Maternal Aunt   ?     early 40s  ? ? ?Social History  ? ?Socioeconomic History  ? Marital status: Married  ?  Spouse name: Deveron Furlong  ? Number of children: 2  ? Years of education: some college  ? Highest education level: Not on file  ?Occupational History  ? Occupation: buyer  ?  Employer: [employer]  ?Tobacco Use  ? Smoking status: Never  ? Smokeless tobacco: Never  ?Vaping Use  ? Vaping Use: Never used  ?Substance and Sexual Activity  ? Alcohol use: Not Currently  ? Drug use: No  ? Sexual activity: Not Currently  ?Other Topics Concern  ? Not on file  ?Social History Narrative  ? REGULAR EXERCISE: YES- WALKS 3-4-DAYS A WEEKDiet: no fast food, instead veggies, loves  baking  ?   ? 09/13/21  ? From: the area  ? Living: with Deveron Furlong, husband [phone])  ? Work: retired - Armed forces logistics/support/administrative officer  ?   ? Family: Orson Slick and Larena Ohnemus - 5 grandchildren, 3 great-grandchildren  ?   ? Enjoys: spend time with family, yard work - flowers  ?   ? Exercise: gym - has a bike, pool in the summer  ? Diet: working on diabetic diet  ?   ? Safety  ? Seat belts: Yes   ? Guns: Yes  and secure  ? Safe in relationships: Yes   ?   ? ?Social Determinants of Health  ? ?Financial Resource Strain: Low Risk   ? Difficulty of Paying Living Expenses: Not hard at all  ?Food Insecurity: No Food Insecurity  ? Worried About Charity fundraiser in the Last Year: Never true  ? Ran Out of Food in the Last Year: Never true  ?Transportation Needs: No Transportation Needs  ? Lack of Transportation (Medical): No  ? Lack of Transportation (Non-Medical): No  ?Physical Activity: Sufficiently Active  ? Days of Exercise per Week: 5 days  ? Minutes of Exercise per Session: 30 min  ?Stress: No Stress Concern Present  ?  Feeling of Stress : Not at all  ?Social Connections: Socially Integrated  ? Frequency of Communication with Friends and Family: More than three times a week  ? Frequency of Social Gatherings with Friends and Family: More than three times a week  ? Attends Religious Services: More than 4 times per year  ? Active Member of Clubs or Organizations: Yes  ? Attends Archivist Meetings: More than 4 times per year  ? Marital Status: Married  ?Intimate Partner Violence: Not At Risk  ? Fear of Current or Ex-Partner: No  ? Emotionally Abused: No  ? Physically Abused: No  ? Sexually Abused: No  ? ? ?Outpatient Medications Prior to Visit  ?Medication Sig Dispense Refill  ? albuterol (VENTOLIN HFA) 108 (90 Base) MCG/ACT inhaler Inhale 2 puffs into the lungs every 6 (six) hours as needed for wheezing or shortness of breath. 1 each 2  ? amLODipine (NORVASC) 5 MG tablet TAKE ONE TABLET BY MOUTH ONCE DAILY 90 tablet 0   ? atorvastatin (LIPITOR) 10 MG tablet TAKE ONE TABLET BY MOUTH ONCE DAILY 90 tablet 0  ? cetirizine (ZYRTEC) 10 MG tablet Take 10 mg daily by mouth.    ? cholecalciferol (VITAMIN D) 1000 UNITS tablet Take 1,000 Units by mouth daily.    ? Dextromethorphan-guaiFENesin (MUCINEX DM MAXIMUM STRENGTH) 60-1200 MG TB12 Take 1 tablet by mouth as needed.    ? esomeprazole (NEXIUM) 40 MG capsule Take 40 mg by mouth daily as needed.    ? hydrochlorothiazide (HYDRODIURIL) 25 MG tablet TAKE ONE TABLET BY MOUTH ONCE DAILY 90 tablet 0  ? ibuprofen (ADVIL) 800 MG tablet TAKE ONE TABLET BY MOUTH EVERY 8 HOURS AS NEEDED 90 tablet 1  ? minoxidil (LONITEN) 2.5 MG tablet Take 2.5 mg by mouth.    ? Multiple Vitamin (MULTIVITAMIN) tablet Take 1 tablet by mouth daily.    ? promethazine-dextromethorphan (PROMETHAZINE-DM) 6.25-15 MG/5ML syrup Take 5 mLs by mouth 4 (four) times daily as needed for cough. 118 mL 0  ? ?Facility-Administered Medications Prior to Visit  ?Medication Dose Route Frequency Provider Last Rate Last Admin  ? albuterol (PROVENTIL) (2.5 MG/3ML) 0.083% nebulizer solution 2.5 mg  2.5 mg Nebulization Once Burnard Hawthorne, FNP      ? ? ?Allergies  ?Allergen Reactions  ? Codeine   ?  REACTION: Nausea and vomiting  ? Ace Inhibitors Cough  ? Estradiol Rash  ?  REACTION: rash at site of administration, topical rash only  ? ? ?ROS ?Review of Systems  ?Constitutional:  Negative for activity change, appetite change, chills, fatigue and fever.  ?Gastrointestinal:  Negative for abdominal pain.  ?Genitourinary:  Positive for dysuria, frequency, pelvic pain and urgency. Negative for decreased urine volume, difficulty urinating, flank pain, genital sores and hematuria.  ?Neurological:  Negative for dizziness and light-headedness.  ?Psychiatric/Behavioral:  Negative for confusion.   ?All other systems reviewed and are negative. ? ?  ?Objective:  ?  ?Physical Exam ?Constitutional:   ?   General: She is not in acute distress. ?    Appearance: Normal appearance. She is well-developed and well-groomed. She is not ill-appearing, toxic-appearing or diaphoretic.  ?HENT:  ?   Mouth/Throat:  ?   Pharynx: No pharyngeal swelling.  ?   Tonsils: No tonsillar exudate.  ?Neck:  ?   Thyroid: No thyroid mass.  ?Pulmonary:  ?   Effort: Pulmonary effort is normal.  ?Abdominal:  ?   Tenderness: There is no abdominal tenderness.  ?Musculoskeletal:  ?  Lumbar back: Normal. No tenderness.  ?   Comments: No flank pain no CVA tenderness  ?Lymphadenopathy:  ?   Cervical:  ?   Right cervical: No superficial cervical adenopathy. ?   Left cervical: No superficial cervical adenopathy.  ?Neurological:  ?   General: No focal deficit present.  ?   Mental Status: She is alert and oriented to person, place, and time. Mental status is at baseline.  ?Psychiatric:     ?   Mood and Affect: Mood normal.     ?   Behavior: Behavior normal.     ?   Thought Content: Thought content normal.     ?   Judgment: Judgment normal.  ? ? ?BP (!) 154/78   Pulse 94   Temp 98.1 ?F (36.7 ?C)   Resp 16   Ht '5\' 1"'$  (1.549 m)   Wt 166 lb 9 oz (75.6 kg)   SpO2 95%   BMI 31.47 kg/m?  ?Wt Readings from Last 3 Encounters:  ?12/13/21 166 lb 9 oz (75.6 kg)  ?12/09/21 165 lb (74.8 kg)  ?09/13/21 167 lb (75.8 kg)  ? ? ? ?There are no preventive care reminders to display for this patient. ? ?There are no preventive care reminders to display for this patient. ? ?Lab Results  ?Component Value Date  ? TSH 0.82 06/17/2018  ? ?Lab Results  ?Component Value Date  ? WBC 8.3 07/20/2020  ? HGB 15.1 (H) 07/20/2020  ? HCT 44.7 07/20/2020  ? MCV 82.4 07/20/2020  ? PLT 341.0 07/20/2020  ? ?Lab Results  ?Component Value Date  ? NA 136 07/20/2020  ? K 3.6 07/20/2020  ? CO2 27 07/20/2020  ? GLUCOSE 114 (H) 07/20/2020  ? BUN 21 07/20/2020  ? CREATININE 0.70 07/20/2020  ? BILITOT 0.4 07/20/2020  ? ALKPHOS 79 07/20/2020  ? AST 17 07/20/2020  ? ALT 21 07/20/2020  ? PROT 6.7 07/20/2020  ? ALBUMIN 4.3 07/20/2020  ? CALCIUM  9.0 07/20/2020  ? ANIONGAP 14 06/15/2017  ? GFR 86.30 07/20/2020  ? ?Lab Results  ?Component Value Date  ? HGBA1C 7.8 (A) 09/13/2021  ? ? ?  ?Assessment & Plan:  ? ?Problem List Items Addressed This Visit   ? ?  ? Gen

## 2021-12-13 NOTE — Patient Instructions (Signed)
It was a pleasure seeing you today.   You were found to have a urinary tract infection, you have been prescribed an antibiotic to your preferred pharmacy. Please start antibiotic today as directed.   We are sending your urine for a culture to make sure you do not have a resistant bacteria. We will call you if we need to change your medications.   Please make sure you are drinking plenty of fluids over the next few days.  If your symptoms do not improve over the next 5-7 days, or if they worsen, please let us know. Please also let us know if you have worsening back pain, fevers, chills, or body aches.   Regards,   Marleni Gallardo  

## 2021-12-13 NOTE — Assessment & Plan Note (Signed)
antbx sent to pharmacy, pt to take as directed. Encouraged increased water intake throughout the day. Urine culture/reflex pending results. Choosing to treat due to being symptomatic. If no improvement in the next 2 days pt advised to let me know.  

## 2021-12-13 NOTE — Assessment & Plan Note (Addendum)
poct urine dip in office ?Urine culture ordered pending results ?rx pyridium 200 mg po tid x 2 days max ? ?

## 2021-12-15 LAB — URINE CULTURE
MICRO NUMBER:: 13389176
SPECIMEN QUALITY:: ADEQUATE

## 2021-12-16 ENCOUNTER — Encounter: Payer: PPO | Admitting: Family Medicine

## 2021-12-27 ENCOUNTER — Ambulatory Visit (INDEPENDENT_AMBULATORY_CARE_PROVIDER_SITE_OTHER): Payer: PPO | Admitting: Family Medicine

## 2021-12-27 VITALS — BP 160/82 | HR 86 | Temp 96.9°F | Ht 61.0 in | Wt 168.5 lb

## 2021-12-27 DIAGNOSIS — Z1231 Encounter for screening mammogram for malignant neoplasm of breast: Secondary | ICD-10-CM | POA: Diagnosis not present

## 2021-12-27 DIAGNOSIS — E119 Type 2 diabetes mellitus without complications: Secondary | ICD-10-CM

## 2021-12-27 DIAGNOSIS — M8580 Other specified disorders of bone density and structure, unspecified site: Secondary | ICD-10-CM

## 2021-12-27 DIAGNOSIS — I10 Essential (primary) hypertension: Secondary | ICD-10-CM

## 2021-12-27 DIAGNOSIS — E782 Mixed hyperlipidemia: Secondary | ICD-10-CM | POA: Diagnosis not present

## 2021-12-27 LAB — COMPREHENSIVE METABOLIC PANEL
ALT: 39 U/L — ABNORMAL HIGH (ref 0–35)
AST: 38 U/L — ABNORMAL HIGH (ref 0–37)
Albumin: 4.4 g/dL (ref 3.5–5.2)
Alkaline Phosphatase: 78 U/L (ref 39–117)
BUN: 14 mg/dL (ref 6–23)
CO2: 28 mEq/L (ref 19–32)
Calcium: 9.6 mg/dL (ref 8.4–10.5)
Chloride: 103 mEq/L (ref 96–112)
Creatinine, Ser: 0.74 mg/dL (ref 0.40–1.20)
GFR: 79.92 mL/min (ref >60.00)
Glucose, Bld: 174 mg/dL — ABNORMAL HIGH (ref 70–99)
Potassium: 4.3 mEq/L (ref 3.5–5.1)
Sodium: 140 mEq/L (ref 135–145)
Total Bilirubin: 0.7 mg/dL (ref 0.2–1.2)
Total Protein: 6.5 g/dL (ref 6.0–8.3)

## 2021-12-27 LAB — LIPID PANEL
Cholesterol: 174 mg/dL (ref 0–200)
HDL: 53.7 mg/dL (ref >39.00)
LDL Cholesterol: 90 mg/dL (ref 0–99)
NonHDL: 120.78
Total CHOL/HDL Ratio: 3
Triglycerides: 153 mg/dL — ABNORMAL HIGH (ref 0.0–149.0)
VLDL: 30.6 mg/dL (ref 0.0–40.0)

## 2021-12-27 LAB — CBC
HCT: 43.3 % (ref 36.0–46.0)
Hemoglobin: 14.3 g/dL (ref 12.0–15.0)
MCHC: 33 g/dL (ref 30.0–36.0)
MCV: 83.2 fl (ref 78.0–100.0)
Platelets: 303 10*3/uL (ref 150.0–400.0)
RBC: 5.21 Mil/uL — ABNORMAL HIGH (ref 3.87–5.11)
RDW: 13.4 % (ref 11.5–15.5)
WBC: 7.6 10*3/uL (ref 4.0–10.5)

## 2021-12-27 LAB — HEMOGLOBIN A1C: Hgb A1c MFr Bld: 7.8 % — ABNORMAL HIGH (ref 4.6–6.5)

## 2021-12-27 NOTE — Assessment & Plan Note (Addendum)
Repeat hemoglobin A1c, she reports not doing well with diet.  Intolerant of metformin due to size of pill.  She is willing to take an oral medication will find 1 to try pending hemoglobin A1c. Will discuss cutting metformin vs glipizide.

## 2021-12-27 NOTE — Assessment & Plan Note (Signed)
Elevated in setting of not taking her medication.  Advised continuing amlodipine 5 mg and hydrochlorothiazide 25 mg.  Home monitoring and update in a couple of weeks with blood pressure.  Return in 3 months for diabetes.

## 2021-12-27 NOTE — Progress Notes (Signed)
Annual Exam   Chief Complaint:  Chief Complaint  Patient presents with   Medicare Wellness    Pt 2  Will request eye exam     History of Present Illness:  Ms. Dana Hayes is a 76 y.o. No obstetric history on file. who LMP was No LMP recorded. Patient has had a hysterectomy., presents today for her annual examination.     Nutrition She does get adequate calcium and Vitamin D in her diet. Diet: some dairy products, could be better - not as good as it could be Exercise: not as often as she could be, tries to go to the gym a few days a week, walking daily    Social History   Tobacco Use  Smoking Status Never  Smokeless Tobacco Never   Social History   Substance and Sexual Activity  Alcohol Use Not Currently   Social History   Substance and Sexual Activity  Drug Use No     General Health Dentist in the last year: No Eye doctor: yes  Safety The patient wears seatbelts: yes.     The patient feels safe at home and in their relationships: yes.   Menstrual:  No issues  GYN She is not sexually active.   Breast Cancer Screening (Age 68-74):  There is no FH of breast cancer. There is no FH of ovarian cancer. BRCA screening Not Indicated.  Last Mammogram: 07/2021 The patient does want a mammogram this year.    Colon Cancer Screening:  Age 4-75 yo - benefits outweigh the risk. Adults 10-85 yo who have never been screened benefit.  Benefits: 134000 people in 2016 will be diagnosed and 49,000 will die - early detection helps Harms: Complications 2/2 to colonoscopy High Risk (Colonoscopy): genetic disorder (Lynch syndrome or familial adenomatous polyposis), personal hx of IBD, previous adenomatous polyp, or previous colorectal cancer, FamHx start 10 years before the age at diagnosis, increased in males and black race  Options:  FIT - looks for hemoglobin (blood in the stool) - specific and fairly sensitive - must be done annually Cologuard - looks for DNA and  blood - more sensitive - therefore can have more false positives, every 3 years Colonoscopy - every 10 years if normal - sedation, bowl prep, must have someone drive you  Shared decision making and the patient had decided to do up to date on colonoscopy.   Social History   Tobacco Use  Smoking Status Never  Smokeless Tobacco Never    Weight Wt Readings from Last 3 Encounters:  12/27/21 168 lb 8 oz (76.4 kg)  12/13/21 166 lb 9 oz (75.6 kg)  12/09/21 165 lb (74.8 kg)   Patient has high BMI  BMI Readings from Last 1 Encounters:  12/27/21 31.84 kg/m     Chronic disease screening Blood pressure monitoring:  BP Readings from Last 3 Encounters:  12/27/21 (!) 160/82  12/13/21 (!) 154/78  10/07/21 139/82    Lipid Monitoring: Indication for screening: age >72, obesity, diabetes, family hx, CV risk factors.  Lipid screening: Yes  Lab Results  Component Value Date   CHOL 164 07/20/2020   HDL 58.10 07/20/2020   LDLCALC 83 07/20/2020   LDLDIRECT 162.6 05/29/2010   TRIG 113.0 07/20/2020   CHOLHDL 3 07/20/2020     Diabetes Screening: age >69, overweight, family hx, PCOS, hx of gestational diabetes, at risk ethnicity Diabetes Screening screening: Yes  Lab Results  Component Value Date   HGBA1C 7.8 (A) 09/13/2021  Past Medical History:  Diagnosis Date   Allergy    Asthma    GERD (gastroesophageal reflux disease)    History of kidney stones    Hyperlipidemia    Hypertension    Obesity     Past Surgical History:  Procedure Laterality Date   ABDOMINAL HYSTERECTOMY     COLONOSCOPY WITH PROPOFOL N/A 04/26/2019   Procedure: COLONOSCOPY WITH PROPOFOL;  Surgeon: Jonathon Bellows, MD;  Location: [facility] ENDOSCOPY;  Service: Gastroenterology;  Laterality: N/A;   LITHOTRIPSY      TIMES 2 1990    Prior to Admission medications   Medication Sig Start Date End Date Taking? Authorizing Provider  albuterol (VENTOLIN HFA) 108 (90 Base) MCG/ACT inhaler Inhale 2 puffs into the  lungs every 6 (six) hours as needed for wheezing or shortness of breath. 09/13/21  Yes Lesleigh Noe, MD  amLODipine (NORVASC) 5 MG tablet TAKE ONE TABLET BY MOUTH ONCE DAILY 11/08/21  Yes Lesleigh Noe, MD  atorvastatin (LIPITOR) 10 MG tablet TAKE ONE TABLET BY MOUTH ONCE DAILY 11/08/21  Yes Lesleigh Noe, MD  cetirizine (ZYRTEC) 10 MG tablet Take 10 mg daily by mouth.   Yes [provider]  cholecalciferol (VITAMIN D) 1000 UNITS tablet Take 1,000 Units by mouth daily.   Yes [provider]  Dextromethorphan-guaiFENesin (MUCINEX DM MAXIMUM STRENGTH) 60-1200 MG TB12 Take 1 tablet by mouth as needed.   Yes [provider]  esomeprazole (NEXIUM) 40 MG capsule Take 40 mg by mouth daily as needed.   Yes [provider]  hydrochlorothiazide (HYDRODIURIL) 25 MG tablet TAKE ONE TABLET BY MOUTH ONCE DAILY 11/08/21  Yes Lesleigh Noe, MD  ibuprofen (ADVIL) 800 MG tablet TAKE ONE TABLET BY MOUTH EVERY 8 HOURS AS NEEDED 08/12/21  Yes Copland, Frederico Hamman, MD  minoxidil (LONITEN) 2.5 MG tablet Take 2.5 mg by mouth. 04/22/21  Yes [provider]  Multiple Vitamin (MULTIVITAMIN) tablet Take 1 tablet by mouth daily.   Yes [provider]    Allergies  Allergen Reactions   Codeine     REACTION: Nausea and vomiting   Ace Inhibitors Cough   Estradiol Rash    REACTION: rash at site of administration, topical rash only    Gynecologic History: No LMP recorded. Patient has had a hysterectomy.  Obstetric History: No obstetric history on file.  Social History   Socioeconomic History   Marital status: Married    Spouse name: Freddy   Number of children: 2   Years of education: some college   Highest education level: Not on file  Occupational History   Occupation: Health visitor: [employer]  Tobacco Use   Smoking status: Never   Smokeless tobacco: Never  Vaping Use   Vaping Use: Never used  Substance and Sexual Activity   Alcohol use: Not  Currently   Drug use: No   Sexual activity: Not Currently  Other Topics Concern   Not on file  Social History Narrative   REGULAR EXERCISE: YES- WALKS 3-4-DAYS A WEEKDiet: no fast food, instead veggies, loves baking      09/13/21   From: the area   Living: with Deveron Furlong, husband (1967)   Work: retired - western IT trainer      Family: Orson Slick and Dnasia Gauna - 5 grandchildren, 3 great-grandchildren      Enjoys: spend time with family, yard work - flowers      Exercise: gym - has a bike, pool in the summer  Diet: working on diabetic diet      Safety   Seat belts: Yes    Guns: Yes  and secure   Safe in relationships: Yes       Social Determinants of Health   Financial Resource Strain: Low Risk    Difficulty of Paying Living Expenses: Not hard at all  Food Insecurity: No Food Insecurity   Worried About Charity fundraiser in the Last Year: Never true   Arboriculturist in the Last Year: Never true  Transportation Needs: No Transportation Needs   Lack of Transportation (Medical): No   Lack of Transportation (Non-Medical): No  Physical Activity: Sufficiently Active   Days of Exercise per Week: 5 days   Minutes of Exercise per Session: 30 min  Stress: No Stress Concern Present   Feeling of Stress : Not at all  Social Connections: Socially Integrated   Frequency of Communication with Friends and Family: More than three times a week   Frequency of Social Gatherings with Friends and Family: More than three times a week   Attends Religious Services: More than 4 times per year   Active Member of [employer] or Organizations: Yes   Attends Music therapist: More than 4 times per year   Marital Status: Married  Human resources officer Violence: Not At Risk   Fear of Current or Ex-Partner: No   Emotionally Abused: No   Physically Abused: No   Sexually Abused: No    Family History  Problem Relation Age of Onset   Coronary artery disease Mother    Diabetes Mother     Heart disease Mother    Coronary artery disease Brother    Heart attack Brother    Liver cancer Brother    Breast cancer Maternal Aunt        early 71s    Review of Systems  Constitutional:  Negative for chills and fever.  HENT:  Negative for congestion and sore throat.   Eyes:  Negative for blurred vision and double vision.  Respiratory:  Negative for shortness of breath.   Cardiovascular:  Negative for chest pain.  Gastrointestinal:  Negative for heartburn, nausea and vomiting.  Genitourinary: Negative.   Musculoskeletal: Negative.  Negative for myalgias.  Skin:  Negative for rash.  Neurological:  Negative for dizziness and headaches.  Endo/Heme/Allergies:  Does not bruise/bleed easily.  Psychiatric/Behavioral:  Negative for depression. The patient is not nervous/anxious.     Physical Exam BP (!) 160/82   Pulse 86   Temp (!) 96.9 F (36.1 C) (Temporal)   Ht 5' 1"  (1.549 m)   Wt 168 lb 8 oz (76.4 kg)   SpO2 98%   BMI 31.84 kg/m    BP Readings from Last 3 Encounters:  12/27/21 (!) 160/82  12/13/21 (!) 154/78  10/07/21 139/82      Physical Exam Constitutional:      General: She is not in acute distress.    Appearance: She is well-developed. She is not diaphoretic.  HENT:     Head: Normocephalic and atraumatic.     Right Ear: External ear normal.     Left Ear: External ear normal.     Nose: Nose normal.  Eyes:     General: No scleral icterus.    Extraocular Movements: Extraocular movements intact.     Conjunctiva/sclera: Conjunctivae normal.  Cardiovascular:     Rate and Rhythm: Normal rate and regular rhythm.     Heart sounds: No murmur heard.  Pulmonary:     Effort: Pulmonary effort is normal. No respiratory distress.     Breath sounds: Normal breath sounds. No wheezing.  Abdominal:     General: Bowel sounds are normal. There is no distension.     Palpations: Abdomen is soft. There is no mass.     Tenderness: There is no abdominal tenderness. There is no  guarding or rebound.  Musculoskeletal:        General: Normal range of motion.     Cervical back: Neck supple.  Lymphadenopathy:     Cervical: No cervical adenopathy.  Skin:    General: Skin is warm and dry.     Capillary Refill: Capillary refill takes less than 2 seconds.  Neurological:     Mental Status: She is alert and oriented to person, place, and time.     Deep Tendon Reflexes: Reflexes normal.  Psychiatric:        Mood and Affect: Mood normal.        Behavior: Behavior normal.    Results:  PHQ-9:  Flowsheet Row Clinical Support from 07/24/2020 in [facility]  PHQ-9 Total Score 0         Assessment: 77 y.o. No obstetric history on file. female here for routine annual physical examination.  Plan: Problem List Items Addressed This Visit       Cardiovascular and Mediastinum   Essential hypertension (Chronic)    Elevated in setting of not taking her medication.  Advised continuing amlodipine 5 mg and hydrochlorothiazide 25 mg.  Home monitoring and update in a couple of weeks with blood pressure.  Return in 3 months for diabetes.       Relevant Orders   Comprehensive metabolic panel   CBC     Endocrine   Diabetes mellitus type 2, diet-controlled (HCC) (Chronic)    Repeat hemoglobin A1c, she reports not doing well with diet.  Intolerant of metformin due to size of pill.  She is willing to take an oral medication will find 1 to try pending hemoglobin A1c. Will discuss cutting metformin vs glipizide.        Relevant Orders   Hemoglobin A1c     Musculoskeletal and Integument   Osteopenia - Primary   Relevant Orders   DG Bone Density     Other   Hyperlipidemia (Chronic)   Relevant Orders   Lipid panel   Other Visit Diagnoses     Encounter for screening mammogram for malignant neoplasm of breast       Relevant Orders   MM Digital Screening       Screening: -- Blood pressure screen elevated: continued to monitor. --  cholesterol screening: will obtain -- Weight screening: overweight: continue to monitor -- Diabetes Screening: will obtain -- Nutrition: Encouraged healthy diet  The 10-year ASCVD risk score (Arnett DK, et al., 2019) is: 46.4%   Values used to calculate the score:     Age: 75 years     Sex: Female     Is Non-Hispanic African American: No     Diabetic: Yes     Tobacco smoker: No     Systolic Blood Pressure: 646 mmHg     Is BP treated: Yes     HDL Cholesterol: 58.1 mg/dL     Total Cholesterol: 164 mg/dL  -- Statin therapy for Age 63-75 with CVD risk >7.5%  Psych -- Depression screening (PHQ-9):  Flowsheet Row Clinical Support from 07/24/2020 in [facility] [place]  PHQ-9 Total Score 0        Safety -- tobacco screening: not using -- alcohol screening:  low-risk usage. -- no evidence of domestic violence or intimate partner violence.   Cancer Screening -- pap smear not collected per ASCCP guidelines -- family history of breast cancer screening: done. not at high risk. -- Mammogram -  up to date -- Colon cancer (age 73+)--  up to date  Immunizations Immunization History  Administered Date(s) Administered   Fluad Quad(high Dose 65+) 04/05/2019, 05/06/2021   Influenza Split 06/09/2011   Influenza Whole 06/04/2008, 06/12/2009, 06/05/2010   Influenza, High Dose Seasonal PF 05/07/2015, 05/12/2020   Influenza,inj,Quad PF,6+ Mos 07/05/2013, 05/10/2014, 05/09/2016, 05/18/2017, 05/20/2018   PFIZER(Purple Top)SARS-COV-2 Vaccination 09/16/2019, 10/07/2019, 05/09/2020   Pneumococcal Conjugate-13 03/02/2014   Pneumococcal Polysaccharide-23 05/09/2016   Tdap 08/23/2012   Zoster, Live 07/11/2008    -- flu vaccine up to date -- TDAP q10 years up to date -- Shingles (age >23) she is planning to get -- PPSV-23 (19-64 with chronic disease or smoking) up to date -- PCV-13 (age >56) - one dose followed by PPSV-23 1 year later up to date -- Covid-19 Vaccine up to  date   Encouraged healthy diet and exercise. Encouraged regular vision and dental care.    Lesleigh Noe, MD

## 2021-12-27 NOTE — Patient Instructions (Addendum)
Your DEXA showed osteopenia.   This means that she is at risk for developing osteoporosis and have some signs of low bone mass.   Would recommend the following:   1) 800 units of Vitamin D daily 2) Get 1200 mg of elemental calcium --- this is best from your diet. Try to track how much calcium you get on a typical day. You could find ways to add more (dairy products, leafy greens). Take a supplement for whatever you don't typically get so you reach 1200 mg of calcium.  3) Physical activity (ideally weight bearing) - like walking briskly 30 minutes 5 days a week.   Your blood pressure high.   High blood pressure increases your risk for heart attack and stroke.    Please check your blood pressure 2-4 times a week.   To check your blood pressure 1) Sit in a quiet and relaxed place for 5 minutes 2) Make sure your feet are flat on the ground 3) Consider checking first thing in the morning   Normal blood pressure is less than 140/90 Ideally you blood pressure should be around 120/80  Other ways you can reduce your blood pressure:  1) Regular exercise -- Try to get 150 minutes (30 minutes, 5 days a week) of moderate to vigorous aerobic excercise -- Examples: brisk walking (2.5 miles per hour), water aerobics, dancing, gardening, tennis, biking slower than 10 miles per hour 2) DASH Diet - low fat meats, more fresh fruits and vegetables, whole grains, low salt 3) Quit smoking if you smoke 4) Loose 5-10% of your body weight

## 2022-01-06 ENCOUNTER — Encounter: Payer: Self-pay | Admitting: Family Medicine

## 2022-01-06 DIAGNOSIS — E119 Type 2 diabetes mellitus without complications: Secondary | ICD-10-CM

## 2022-01-06 DIAGNOSIS — M5430 Sciatica, unspecified side: Secondary | ICD-10-CM

## 2022-01-13 MED ORDER — METFORMIN HCL 500 MG PO TABS: 500.0000 mg | ORAL_TABLET | Freq: Two times a day (BID) | ORAL | 3 refills | Status: DC

## 2022-01-13 MED ORDER — CYCLOBENZAPRINE HCL 5 MG PO TABS
5.0000 mg | ORAL_TABLET | Freq: Three times a day (TID) | ORAL | 0 refills | Status: AC | PRN
Start: 2022-01-13 — End: ?

## 2022-01-13 NOTE — Addendum Note (Signed)
Addended by: Lesleigh Noe on: 01/13/2022 01:57 PM   Modules accepted: Orders

## 2022-01-22 ENCOUNTER — Encounter: Payer: Self-pay | Admitting: Family Medicine

## 2022-02-06 ENCOUNTER — Telehealth: Payer: Self-pay

## 2022-02-06 ENCOUNTER — Other Ambulatory Visit: Payer: Self-pay | Admitting: Family Medicine

## 2022-02-06 NOTE — Chronic Care Management (AMB) (Signed)
Chronic Care Management Pharmacy Assistant   Name: Dana Hayes  MRN: [id number] DOB: June 30, 1946   Reason for Encounter: Medication Adherence and Delivery Coordination    Recent office visits:  12/27/21-Jessica Cody,MD(PCP)- AWV Labs (A1c 7.8)(Some of your liver tests were a little elevated - I'm not worried about these. Cholesterol looks ok) discussed screenings,vaccines,discussed starting  and cutting metformin 1/2.recommend 800 units of vit D daily and 1200 mg calcium,monitor BP at home f/u 3 months 12/13/21-Tabitha Dugal,FNP(fam med)- urinary frequency, UA and culture,start pyridium '200mg'$  and bactrim DS 800-'160mg'$  09/13/21-Jessica Cody,MD(PCP)- DM,TOC, If still elevated will need to start medication- future labs ordered f/u 3 months.  Recent consult visits:  None since last CCM contact   Hospital visits:  None in previous 6 months  Medications: Outpatient Encounter Medications as of 02/06/2022  Medication Sig Note   albuterol (VENTOLIN HFA) 108 (90 Base) MCG/ACT inhaler Inhale 2 puffs into the lungs every 6 (six) hours as needed for wheezing or shortness of breath.    amLODipine (NORVASC) 5 MG tablet TAKE ONE TABLET BY MOUTH ONCE DAILY    atorvastatin (LIPITOR) 10 MG tablet TAKE ONE TABLET BY MOUTH ONCE DAILY    cetirizine (ZYRTEC) 10 MG tablet Take 10 mg daily by mouth.    cholecalciferol (VITAMIN D) 1000 UNITS tablet Take 1,000 Units by mouth daily.    cyclobenzaprine (FLEXERIL) 5 MG tablet Take 1 tablet (5 mg total) by mouth 3 (three) times daily as needed for muscle spasms.    Dextromethorphan-guaiFENesin (MUCINEX DM MAXIMUM STRENGTH) 60-1200 MG TB12 Take 1 tablet by mouth as needed.    esomeprazole (NEXIUM) 40 MG capsule Take 40 mg by mouth daily as needed.    hydrochlorothiazide (HYDRODIURIL) 25 MG tablet TAKE ONE TABLET BY MOUTH ONCE DAILY    ibuprofen (ADVIL) 800 MG tablet TAKE ONE TABLET BY MOUTH EVERY 8 HOURS AS NEEDED    metFORMIN (GLUCOPHAGE) 500 MG tablet Take 1  tablet (500 mg total) by mouth 2 (two) times daily with a meal.    minoxidil (LONITEN) 2.5 MG tablet Take 2.5 mg by mouth. 04/29/2021: Started 04/22/21 [name], MD   Multiple Vitamin (MULTIVITAMIN) tablet Take 1 tablet by mouth daily.    Facility-Administered Encounter Medications as of 02/06/2022  Medication   albuterol (PROVENTIL) (2.5 MG/3ML) 0.083% nebulizer solution 2.5 mg   BP Readings from Last 3 Encounters:  12/27/21 (!) 160/82  12/13/21 (!) 154/78  10/07/21 139/82    Lab Results  Component Value Date   HGBA1C 7.8 (H) 12/27/2021      Recent OV, Consult or Hospital visit:   PCP- try 1/2 tablet metformin 2 times daily  Recent medication changes indicated:  Family Medicine-antibiotics    Last adherence delivery date:11/21/21      Patient is due for next adherence delivery on: 02/19/22  Spoke with patient on 02/06/22 reviewed medications and coordinated delivery.  This delivery to include: Adherence Packaging  90 Days  Packs: Hydrochlorothiazide 25 mg - 1 tablet daily(breakfast) Atorvastatin 10 mg - 1 tablet daily(breakfast) Amlodipine 5 mg - 1 tablet daily(breakfast) Metformin '500mg'$ - take 1 tablet breakfast 1 tablet evening meal    VIAL medications:/ Ibuprofen 800 mg - take 1 tablet every 8 hours as needed Minoxidil 2.5 mg - take 1/2 tablet daily       Patient declined the following medications this month: Albuterol Inhaler- 2 puffs every 6 hours as needed   Any concerns about your medications?  As long as metformin is the  round pill, she is tolerating 1 tablet breakfast and 1 tablet evening meal   How often do you forget or accidentally miss a dose? Never  Do you use a pillbox? No  Is patient in packaging Yes  What is the date on your next pill pack? Not home to read package   Any concerns or issues with your packaging? Patient is satisfied    Refills requested from providers include: Ibuprofen,amlodipine,atorvasatin,Hctz,  Confirmed delivery date of  02/19/22, advised patient that pharmacy will contact them the morning of delivery.  Recent blood pressure readings are as follows :   02/06/22- 149/77   02/03/22- 138/82   02/01/22- 139/82    Annual wellness visit in last year? Yes Most Recent BP reading:160/82  office 12/27/21  If Diabetic: Most recent A1C reading:7.8  12/27/21 Last eye exam / retinopathy screening:UTD Last diabetic foot exam:UTD  Cycle dispensing form sent to [facility], CPP for review.  Charlene Brooke, CPP notified  Avel Sensor, [place]  [phone]

## 2022-02-06 NOTE — Telephone Encounter (Signed)
Last office visit 12/27/21 for [place].  Last refilled 08/12/21 for #90 with 1 refill by Dr. Lorelei Pont.  No future appointments with PCP.  Ok to refill?

## 2022-02-07 ENCOUNTER — Other Ambulatory Visit: Payer: Self-pay

## 2022-02-07 MED ORDER — HYDROCHLOROTHIAZIDE 25 MG PO TABS: 25.0000 mg | ORAL_TABLET | Freq: Every day | ORAL | 1 refills | Status: DC

## 2022-02-07 MED ORDER — ATORVASTATIN CALCIUM 10 MG PO TABS: 10.0000 mg | ORAL_TABLET | Freq: Every day | ORAL | 1 refills | Status: DC

## 2022-02-07 MED ORDER — AMLODIPINE BESYLATE 5 MG PO TABS: 5.0000 mg | ORAL_TABLET | Freq: Every day | ORAL | 1 refills | Status: DC

## 2022-02-07 NOTE — Telephone Encounter (Signed)
Received S drive faxes for Atorvastatin 10 mg taking one daily po; amlodipine 5 mg taking one daily po; and HCTZ 25 mg taking one daily po.  Will send electronically # 90 on each with one refill per protocol. Pt to return for FU appt end of August 2023.

## 2022-03-08 ENCOUNTER — Emergency Department: Payer: PPO

## 2022-03-08 ENCOUNTER — Emergency Department
Admission: EM | Admit: 2022-03-08 | Discharge: 2022-03-08 | Disposition: A | Discharge status: Home / Self Care | Payer: PPO | Attending: Emergency Medicine | Admitting: Emergency Medicine

## 2022-03-08 ENCOUNTER — Other Ambulatory Visit: Payer: Self-pay

## 2022-03-08 ENCOUNTER — Encounter: Payer: Self-pay | Admitting: Emergency Medicine

## 2022-03-08 DIAGNOSIS — I10 Essential (primary) hypertension: Secondary | ICD-10-CM | POA: Insufficient documentation

## 2022-03-08 DIAGNOSIS — I4891 Unspecified atrial fibrillation: Secondary | ICD-10-CM | POA: Insufficient documentation

## 2022-03-08 DIAGNOSIS — R7401 Elevation of levels of liver transaminase levels: Secondary | ICD-10-CM | POA: Insufficient documentation

## 2022-03-08 DIAGNOSIS — Z20822 Contact with and (suspected) exposure to covid-19: Secondary | ICD-10-CM | POA: Insufficient documentation

## 2022-03-08 DIAGNOSIS — R0602 Shortness of breath: Secondary | ICD-10-CM | POA: Diagnosis not present

## 2022-03-08 DIAGNOSIS — R000 Tachycardia, unspecified: Secondary | ICD-10-CM | POA: Diagnosis present

## 2022-03-08 LAB — HEPATIC FUNCTION PANEL
ALT: 46 U/L — ABNORMAL HIGH (ref 0–44)
AST: 33 U/L (ref 15–41)
Albumin: 4.2 g/dL (ref 3.5–5.0)
Alkaline Phosphatase: 82 U/L (ref 38–126)
Bilirubin, Direct: 0.1 mg/dL (ref 0.0–0.2)
Indirect Bilirubin: 0.6 mg/dL (ref 0.3–0.9)
Total Bilirubin: 0.7 mg/dL (ref 0.3–1.2)
Total Protein: 6.6 g/dL (ref 6.5–8.1)

## 2022-03-08 LAB — BASIC METABOLIC PANEL
Anion gap: 8 (ref 5–15)
BUN: 13 mg/dL (ref 8–23)
CO2: 23 mmol/L (ref 22–32)
Calcium: 9.4 mg/dL (ref 8.9–10.3)
Chloride: 105 mmol/L (ref 98–111)
Creatinine, Ser: 0.78 mg/dL (ref 0.44–1.00)
GFR, Estimated: >60 mL/min (ref >60)
Glucose, Bld: 226 mg/dL — ABNORMAL HIGH (ref 70–99)
Potassium: 3.6 mmol/L (ref 3.5–5.1)
Sodium: 136 mmol/L (ref 135–145)

## 2022-03-08 LAB — TSH: TSH: 0.373 u[IU]/mL (ref 0.350–4.500)

## 2022-03-08 LAB — MAGNESIUM: Magnesium: 1.9 mg/dL (ref 1.7–2.4)

## 2022-03-08 LAB — CBC
HCT: 48 % — ABNORMAL HIGH (ref 36.0–46.0)
Hemoglobin: 15.8 g/dL — ABNORMAL HIGH (ref 12.0–15.0)
MCH: 26.9 pg (ref 26.0–34.0)
MCHC: 32.9 g/dL (ref 30.0–36.0)
MCV: 81.6 fL (ref 80.0–100.0)
Platelets: 369 10*3/uL (ref 150–400)
RBC: 5.88 MIL/uL — ABNORMAL HIGH (ref 3.87–5.11)
RDW: 12.9 % (ref 11.5–15.5)
WBC: 9.7 10*3/uL (ref 4.0–10.5)
nRBC: 0 % (ref 0.0–0.2)

## 2022-03-08 LAB — D-DIMER, QUANTITATIVE: D-Dimer, Quant: 0.49 ug/mL-FEU (ref 0.00–0.50)

## 2022-03-08 LAB — TROPONIN I (HIGH SENSITIVITY)
Troponin I (High Sensitivity): 4 ng/L (ref <18)
Troponin I (High Sensitivity): 5 ng/L (ref <18)

## 2022-03-08 LAB — SARS CORONAVIRUS 2 BY RT PCR: SARS Coronavirus 2 by RT PCR: NEGATIVE

## 2022-03-08 LAB — T4, FREE: Free T4: 0.86 ng/dL (ref 0.61–1.12)

## 2022-03-08 MED ORDER — METOPROLOL TARTRATE 5 MG/5ML IV SOLN
2.5000 mg | Freq: Once | INTRAVENOUS | Status: AC
  Administered 2022-03-08: 2.5 mg via INTRAVENOUS
  Filled 2022-03-08: qty 5

## 2022-03-08 MED ORDER — METOPROLOL TARTRATE 25 MG PO TABS
25.0000 mg | ORAL_TABLET | Freq: Once | ORAL | Status: AC
  Administered 2022-03-08: 25 mg via ORAL
  Filled 2022-03-08: qty 1

## 2022-03-08 MED ORDER — METOPROLOL TARTRATE 25 MG PO TABS: 25.0000 mg | ORAL_TABLET | Freq: Two times a day (BID) | ORAL | 0 refills | Status: DC

## 2022-03-08 MED ORDER — APIXABAN 5 MG PO TABS: 5.0000 mg | ORAL_TABLET | Freq: Two times a day (BID) | ORAL | 0 refills | Status: DC

## 2022-03-08 MED ORDER — METOPROLOL TARTRATE 5 MG/5ML IV SOLN
5.0000 mg | Freq: Once | INTRAVENOUS | Status: AC
  Administered 2022-03-08: 5 mg via INTRAVENOUS
  Filled 2022-03-08: qty 5

## 2022-03-08 NOTE — ED Provider Notes (Addendum)
[facility] Provider Note    Event Date/Time   First MD Initiated Contact with Patient 03/08/22 1007     (approximate)   History   Irregular Heart Beat, Chest Pain, and Shortness of Breath   HPI  Dana Hayes is a 76 y.o. female with hypertension, hyperlipidemia without any personal history of A-fib who comes in with concern for her heart racing chest pain and shortness of breath.  Patient reports of increasing fatigue and just not feeling her normal self for the past week and then this morning she woke up with her heart rate beating out of her chest.  She reports some associated chest pain and shortness of breath with it.  Denies any history of blood clots, alcohol use, bleeding issues.  Physical Exam   Triage Vital Signs: ED Triage Vitals  Enc Vitals Group     BP 03/08/22 0958 131/86     Pulse Rate 03/08/22 0958 (!) 131     Resp 03/08/22 0958 17     Temp 03/08/22 0958 97.8 F (36.6 C)     Temp Source 03/08/22 0958 Oral     SpO2 03/08/22 0958 95 %     Weight 03/08/22 0952 162 lb (73.5 kg)     Height 03/08/22 0952 '5\' 1"'$  (1.549 m)     Head Circumference --      Peak Flow --      Pain Score 03/08/22 0952 0     Pain Loc --      Pain Edu? --      Excl. in [place]? --     Most recent vital signs: Vitals:   03/08/22 0958  BP: 131/86  Pulse: (!) 131  Resp: 17  Temp: 97.8 F (36.6 C)  SpO2: 95%     General: Awake, no distress.  CV:  Good peripheral perfusion.  Irregular, tachycardic Resp:  Normal effort.  Abd:  No distention.  Soft and nontender Other:  No swelling the legs.  No calf tenderness   ED Results / Procedures / Treatments   Labs (all labs ordered are listed, but only abnormal results are displayed) Labs Reviewed  BASIC METABOLIC PANEL - Abnormal; Notable for the following components:      Result Value   Glucose, Bld 226 (*)    All other components within normal limits  CBC - Abnormal; Notable for the following components:    RBC 5.88 (*)    Hemoglobin 15.8 (*)    HCT 48.0 (*)    All other components within normal limits  SARS CORONAVIRUS 2 BY RT PCR  MAGNESIUM  TSH  T4, FREE  HEPATIC FUNCTION PANEL  D-DIMER, QUANTITATIVE  TROPONIN I (HIGH SENSITIVITY)     EKG  My interpretation of EKG:  Atrial fibrillation with RVR with a rate of 132 without any ST elevation or T wave inversions, normal intervals  RADIOLOGY I have reviewed the xray personally and interpreted no evidence of pulmonary edema   PROCEDURES:  Critical Care performed: Yes, see critical care procedure note(s)  .1-3 Lead EKG Interpretation  Performed by: Vanessa [place], MD Authorized by: Vanessa [place], MD     Interpretation: abnormal     ECG rate:  130   ECG rate assessment: tachycardic     Rhythm: atrial fibrillation     Ectopy: none     Conduction: normal   .Critical Care  Performed by: Vanessa [place], MD Authorized by: Vanessa , MD  Critical care provider statement:    Critical care time (minutes):  30   Critical care was necessary to treat or prevent imminent or life-threatening deterioration of the following conditions:  Cardiac failure   Critical care was time spent personally by me on the following activities:  Development of treatment plan with patient or surrogate, discussions with consultants, evaluation of patient's response to treatment, examination of patient, ordering and review of laboratory studies, ordering and review of radiographic studies, ordering and performing treatments and interventions, pulse oximetry, re-evaluation of patient's condition and review of old charts    MEDICATIONS ORDERED IN ED: Medications  metoprolol tartrate (LOPRESSOR) injection 2.5 mg (2.5 mg Intravenous Given 03/08/22 1131)  metoprolol tartrate (LOPRESSOR) injection 5 mg (5 mg Intravenous Given 03/08/22 1205)  metoprolol tartrate (LOPRESSOR) tablet 25 mg (25 mg Oral Given 03/08/22 1209)     IMPRESSION / MDM / [place]  / ED COURSE  I reviewed the triage vital signs and the nursing notes.   Patient's presentation is most consistent with acute presentation with potential threat to life or bodily function.   Patient comes in with A-fib with RVR.  Patient reports some increasing fatigue over the past week unclear if patient could have been of A-fib just rate controlled versus going into the A-fib acutely today.  Patient's [place] Vascor is 4.  We discussed the risk of cardioversion and patient would prefer to treat with IV medications and rate control at this time.  We will give a dose of IV metoprolol and get work-up to look for any causes for new A-fib with RVR including Electra abnormalities, thyroid dysfunction, PE.  Symptoms are gone for patient is ambulatory around the room without recurrent symptoms D-dimer negative.  TSH normal.  Electrolytes reassuring.  ALT slightly elevated but similar to prior.  COVID-negative.  Patient heart rates have come down and she is been monitored in the emergency room for over 4 hours.  She has been on the oral metoprolol and heart rates have been less than 100.  We discussed admission versus discharge and close cardiology follow-up.  Given patient's been asymptomatic she would prefer to go home.  Discussed case with Dr. Jill Side will get her an appointment for Tuesday at 9 AM.  She understands that her heart rates could increase at any time that she return to the ER and expressed understanding.  We discussed the blood thinner and she would like to proceed given her elevated CHA2DS2-VASc score.  She understands that if she develops any injuries or black stool she is return to the ER immediately and understands avoid ibuprofen, NSAIDs. She denies daily etoh use and history of falls to suggest being high risk.  D/w pharmacy and no decreased dosing needed can start at '5mg'$  BID.     The patient is on the cardiac monitor to evaluate for evidence of arrhythmia and/or significant heart rate  changes.      FINAL CLINICAL IMPRESSION(S) / ED DIAGNOSES   Final diagnoses:  Atrial fibrillation with rapid ventricular response ([place])     Rx / DC Orders   ED Discharge Orders          Ordered    metoprolol tartrate (LOPRESSOR) 25 MG tablet  2 times daily,   Status:  Discontinued        03/08/22 1351    apixaban (ELIQUIS) 5 MG TABS tablet  2 times daily,   Status:  Discontinued        03/08/22 1352  apixaban (ELIQUIS) 5 MG TABS tablet  2 times daily        03/08/22 1425    metoprolol tartrate (LOPRESSOR) 25 MG tablet  2 times daily        03/08/22 1425             Note:  This document was prepared using Dragon voice recognition software and may include unintentional dictation errors.   Vanessa [place], MD 03/08/22 1430    Vanessa [place], MD 03/08/22 [phone]

## 2022-03-08 NOTE — ED Triage Notes (Signed)
Pt over from [facility], reports woke up this am with her heart racing,SOB and a jittery feeling in her chest. Denies hx of the same but states family hx of a-fib.

## 2022-03-08 NOTE — Discharge Instructions (Signed)
We have started you on a blood thinner so if you develop any trauma to the head or bleeding issues return to the ER. We have started you on a medication called metoprolol to decrease her heart rate.  Record your heart rates once in the morning and once at nighttime.  It would be okay for you to take an extra dose of metoprolol if your heart rates are consistently staying above 115.  My goal is for your heart rate to be less than 110.  Return to the ER for worsening symptoms or any other concerns  Otherwise follow-up with cardiology as we discussed

## 2022-03-10 ENCOUNTER — Telehealth: Payer: Self-pay

## 2022-03-10 NOTE — Telephone Encounter (Signed)
Per chart review tab pt went to [facility] ED on 03/08/22. Sending note to Dr Einar Pheasant and [facility] CMA.

## 2022-03-10 NOTE — Telephone Encounter (Signed)
**Note Hayes-Identified via Obfuscation** [facility] - Client TELEPHONE ADVICE RECORD [facility] Patient Name: Dana Hayes Gender: Female DOB: 1946-06-29 Age: 76 Y 2 M 23 D Return Phone Number: [phone] (Primary) Address: City/ State/ Zip: [place]  [address] Client [facility] [facility] - Client Client Site [place] Provider Waunita Schooner- MD Contact Type Call Who Is Calling Patient / Member / Family / Caregiver Call Type Triage / Clinical Relationship To Patient Self Return Phone Number [phone] (Primary) Chief Complaint Heart palpitations or irregular heartbeat Reason for Call Symptomatic / Request for [place] states she has been having irregular heart beat. Translation No Nurse Assessment Nurse: Raphael Gibney, RN, Vanita Ingles Date/Time (Eastern Time): 03/08/2022 7:11:26 AM Confirm and document reason for call. If symptomatic, describe symptoms. ---Caller states she is having an irregular heart beat. her apple watch says 141 right now but has gotten ranges from 71 to 141. Does the patient have any new or worsening symptoms? ---Yes Will a triage be completed? ---Yes Related visit to physician within the last 2 weeks? ---No Does the PT have any chronic conditions? (i.e. diabetes, asthma, this includes High risk factors for pregnancy, etc.) ---Yes List chronic conditions. ---HTN; diabetes Is this a behavioral health or substance abuse call? ---No Guidelines Guideline Title Affirmed Question Affirmed Notes Nurse Date/Time (Eastern Time) Heart Rate and Heartbeat Questions [1] Heart beating very rapidly (e.g., > 140 / minute) AND [2] present now (Exception: During exercise.) Raphael Gibney, RN, Vanita Ingles 03/08/2022 7:13:55 AM Disp. Time Eilene Ghazi Time) Disposition Final User 03/08/2022 7:16:43 AM Go to ED Now Yes Raphael Gibney, RN, Vanita Ingles PLEASE NOTE: All timestamps contained within this report are  represented as [place] Standard Time. CONFIDENTIALTY NOTICE: This fax transmission is intended only for the addressee. It contains information that is legally privileged, confidential or otherwise protected from use or disclosure. If you are not the intended recipient, you are strictly prohibited from reviewing, disclosing, copying using or disseminating any of this information or taking any action in reliance on or regarding this information. If you have received this fax in error, please notify us immediately by telephone so that we can arrange for its return to [place]. Phone: [phone], Toll-Free: [phone], Fax: [phone] Page: 2 of 2 Call Id: [id number] Final Disposition 03/08/2022 7:16:43 AM Go to ED Now Yes Raphael Gibney, RN, Doreatha Lew Disagree/Comply Comply Caller Understands Yes PreDisposition Go to ED Care Advice Given Per Guideline GO TO ED NOW: * You need to be seen in the Emergency Department. NOTE TO TRIAGER - DRIVING: ANOTHER ADULT SHOULD DRIVE: * Another adult should drive. * It is better and safer if another adult drives instead of you. CARE ADVICE given per Heart Rate and Heartbeat Questions (Adult) guideline. Referrals [facility]

## 2022-03-10 NOTE — Telephone Encounter (Signed)
Noted, please have pt schedule ER f/u in 1-2 weeks

## 2022-03-10 NOTE — Telephone Encounter (Signed)
Patient has been scheduled

## 2022-03-11 DIAGNOSIS — E782 Mixed hyperlipidemia: Secondary | ICD-10-CM | POA: Diagnosis not present

## 2022-03-11 DIAGNOSIS — M79606 Pain in leg, unspecified: Secondary | ICD-10-CM | POA: Diagnosis not present

## 2022-03-11 DIAGNOSIS — I10 Essential (primary) hypertension: Secondary | ICD-10-CM | POA: Diagnosis not present

## 2022-03-11 DIAGNOSIS — J45998 Other asthma: Secondary | ICD-10-CM | POA: Diagnosis not present

## 2022-03-11 DIAGNOSIS — E669 Obesity, unspecified: Secondary | ICD-10-CM | POA: Diagnosis not present

## 2022-03-11 DIAGNOSIS — I4891 Unspecified atrial fibrillation: Secondary | ICD-10-CM | POA: Diagnosis not present

## 2022-03-11 DIAGNOSIS — K219 Gastro-esophageal reflux disease without esophagitis: Secondary | ICD-10-CM | POA: Diagnosis not present

## 2022-03-14 DIAGNOSIS — I4891 Unspecified atrial fibrillation: Secondary | ICD-10-CM | POA: Diagnosis not present

## 2022-03-14 DIAGNOSIS — M79606 Pain in leg, unspecified: Secondary | ICD-10-CM | POA: Diagnosis not present

## 2022-03-17 DIAGNOSIS — I4891 Unspecified atrial fibrillation: Secondary | ICD-10-CM | POA: Diagnosis not present

## 2022-03-17 DIAGNOSIS — M79606 Pain in leg, unspecified: Secondary | ICD-10-CM | POA: Diagnosis not present

## 2022-03-19 ENCOUNTER — Ambulatory Visit (INDEPENDENT_AMBULATORY_CARE_PROVIDER_SITE_OTHER): Payer: PPO | Admitting: Family Medicine

## 2022-03-19 VITALS — BP 140/80 | HR 74 | Temp 97.2°F | Ht 61.0 in | Wt 166.2 lb

## 2022-03-19 DIAGNOSIS — I480 Paroxysmal atrial fibrillation: Secondary | ICD-10-CM

## 2022-03-19 DIAGNOSIS — E1169 Type 2 diabetes mellitus with other specified complication: Secondary | ICD-10-CM | POA: Diagnosis not present

## 2022-03-19 DIAGNOSIS — I10 Essential (primary) hypertension: Secondary | ICD-10-CM | POA: Diagnosis not present

## 2022-03-19 MED ORDER — APIXABAN 5 MG PO TABS: 5.0000 mg | ORAL_TABLET | Freq: Two times a day (BID) | ORAL | 0 refills | Status: DC

## 2022-03-19 NOTE — Assessment & Plan Note (Addendum)
Reviewed ER visit. Patient seems to be tolerating apixaban, discussed need for continuation to prevent stroke.  Refill Provided.  Continue metoprolol 25 mg twice daily.  Reviewed that this can cause dry mouth, offered changing to carvedilol which has a slightly less risk for dry mouth.  She declined she has follow-up with cardiology in approximately 10 days, will address with them.  She ate Dr. Laurelyn Sickle support.

## 2022-03-19 NOTE — Progress Notes (Signed)
Subjective:     Dana Hayes is a 76 y.o. female presenting for Hospitalization Follow-up (A-fib)     HPI  #Afib - has been in the 70-80s  - did feel it when she went to the ER - was 180s when she went - is taking metoprolol 25 mg BID - no issues since going home  - did go up to 80 bpm - saw Dr. Humphrey Rolls (cards) and had an [place] and blood flow check, has a stress test this Friday - is taking apixaban - no bruising or bleeding or heart burn  #HTN - has been checking at home with good control - 130/60-70s  Review of Systems  03/08/2022: ER - Afib w/ RVR and fatigue - lopressor x 3. Discharged on eliquis and metoprolol 25 mg BID  Social History   Tobacco Use  Smoking Status Never  Smokeless Tobacco Never        Objective:    BP Readings from Last 3 Encounters:  03/19/22 (!) 140/80  03/08/22 112/79  12/27/21 (!) 160/82   Wt Readings from Last 3 Encounters:  03/19/22 166 lb 4 oz (75.4 kg)  03/08/22 162 lb (73.5 kg)  12/27/21 168 lb 8 oz (76.4 kg)    BP (!) 140/80   Pulse 74   Temp (!) 97.2 F (36.2 C) (Temporal)   Ht '5\' 1"'$  (1.549 m)   Wt 166 lb 4 oz (75.4 kg)   SpO2 97%   BMI 31.41 kg/m    Physical Exam Constitutional:      General: She is not in acute distress.    Appearance: She is well-developed. She is not diaphoretic.  HENT:     Right Ear: External ear normal.     Left Ear: External ear normal.     Nose: Nose normal.  Eyes:     Conjunctiva/sclera: Conjunctivae normal.  Cardiovascular:     Rate and Rhythm: Normal rate and regular rhythm.     Heart sounds: No murmur heard. Pulmonary:     Effort: Pulmonary effort is normal. No respiratory distress.     Breath sounds: Normal breath sounds. No wheezing.  Musculoskeletal:     Cervical back: Neck supple.  Skin:    General: Skin is warm and dry.     Capillary Refill: Capillary refill takes less than 2 seconds.  Neurological:     Mental Status: She is alert. Mental status is at baseline.   Psychiatric:        Mood and Affect: Mood normal.        Behavior: Behavior normal.           Assessment & Plan:   Problem List Items Addressed This Visit       Cardiovascular and Mediastinum   Essential hypertension - Primary (Chronic)    She reports normal blood pressure at home.  Suspect elevation today is stress.  Continue amlodipine 5 mg, metoprolol 25 mg twice daily, hydrochlorothiazide 25 mg.      Relevant Medications   apixaban (ELIQUIS) 5 MG TABS tablet   Paroxysmal atrial fibrillation [facility])    Reviewed ER visit. Patient seems to be tolerating apixaban, discussed need for continuation to prevent stroke.  Refill Provided.  Continue metoprolol 25 mg twice daily.  Reviewed that this can cause dry mouth, offered changing to carvedilol which has a slightly less risk for dry mouth.  She declined she has follow-up with cardiology in approximately 10 days, will address with them.  She ate Dr.  Khan's support.      Relevant Medications   apixaban (ELIQUIS) 5 MG TABS tablet     Endocrine   Type 2 diabetes mellitus with other specified complication [facility])    Lab Results  Component Value Date   HGBA1C 7.8 (H) 12/27/2021  Recently started metformin, working on diet.  For recheck.  Reviewed that metformin does not cause dry mouth, continue medication.        Return in about 4 weeks (around 04/16/2022) for Diabetes follow-up.  Lesleigh Noe, MD

## 2022-03-19 NOTE — Patient Instructions (Addendum)
Dry Mouth - can be caused by metoprolol - Could switch to carvedilol   Check with cardiology

## 2022-03-19 NOTE — Assessment & Plan Note (Signed)
Lab Results  Component Value Date   HGBA1C 7.8 (H) 12/27/2021   Recently started metformin, working on diet.  For recheck.  Reviewed that metformin does not cause dry mouth, continue medication.

## 2022-03-19 NOTE — Assessment & Plan Note (Signed)
She reports normal blood pressure at home.  Suspect elevation today is stress.  Continue amlodipine 5 mg, metoprolol 25 mg twice daily, hydrochlorothiazide 25 mg.

## 2022-03-21 DIAGNOSIS — I4891 Unspecified atrial fibrillation: Secondary | ICD-10-CM | POA: Diagnosis not present

## 2022-04-01 DIAGNOSIS — I340 Nonrheumatic mitral (valve) insufficiency: Secondary | ICD-10-CM | POA: Diagnosis not present

## 2022-04-01 DIAGNOSIS — E782 Mixed hyperlipidemia: Secondary | ICD-10-CM | POA: Diagnosis not present

## 2022-04-01 DIAGNOSIS — I4891 Unspecified atrial fibrillation: Secondary | ICD-10-CM | POA: Diagnosis not present

## 2022-04-01 DIAGNOSIS — K219 Gastro-esophageal reflux disease without esophagitis: Secondary | ICD-10-CM | POA: Diagnosis not present

## 2022-04-01 DIAGNOSIS — I10 Essential (primary) hypertension: Secondary | ICD-10-CM | POA: Diagnosis not present

## 2022-04-01 DIAGNOSIS — E669 Obesity, unspecified: Secondary | ICD-10-CM | POA: Diagnosis not present

## 2022-04-01 DIAGNOSIS — J45998 Other asthma: Secondary | ICD-10-CM | POA: Diagnosis not present

## 2022-04-02 ENCOUNTER — Telehealth: Payer: Self-pay

## 2022-04-02 DIAGNOSIS — X32XXXA Exposure to sunlight, initial encounter: Secondary | ICD-10-CM | POA: Diagnosis not present

## 2022-04-02 DIAGNOSIS — L820 Inflamed seborrheic keratosis: Secondary | ICD-10-CM | POA: Diagnosis not present

## 2022-04-02 DIAGNOSIS — L658 Other specified nonscarring hair loss: Secondary | ICD-10-CM | POA: Diagnosis not present

## 2022-04-02 DIAGNOSIS — D2261 Melanocytic nevi of right upper limb, including shoulder: Secondary | ICD-10-CM | POA: Diagnosis not present

## 2022-04-02 DIAGNOSIS — D485 Neoplasm of uncertain behavior of skin: Secondary | ICD-10-CM | POA: Diagnosis not present

## 2022-04-02 DIAGNOSIS — L821 Other seborrheic keratosis: Secondary | ICD-10-CM | POA: Diagnosis not present

## 2022-04-02 DIAGNOSIS — D2272 Melanocytic nevi of left lower limb, including hip: Secondary | ICD-10-CM | POA: Diagnosis not present

## 2022-04-02 DIAGNOSIS — D2271 Melanocytic nevi of right lower limb, including hip: Secondary | ICD-10-CM | POA: Diagnosis not present

## 2022-04-02 DIAGNOSIS — D2262 Melanocytic nevi of left upper limb, including shoulder: Secondary | ICD-10-CM | POA: Diagnosis not present

## 2022-04-02 DIAGNOSIS — D225 Melanocytic nevi of trunk: Secondary | ICD-10-CM | POA: Diagnosis not present

## 2022-04-02 DIAGNOSIS — L565 Disseminated superficial actinic porokeratosis (DSAP): Secondary | ICD-10-CM | POA: Diagnosis not present

## 2022-04-02 NOTE — Chronic Care Management (AMB) (Signed)
Spoke with the patient to see how many pills remaining of the eliquis and metoprolol. Decrease in metoprolol to  Metoprolol Succinate '25mg'$ -take 1 tablet bedtime. Will get pharmacy to short fill the medications and have delivered.  Charlene Brooke, CPP notified  Avel Sensor, [place]  [phone]

## 2022-04-14 DIAGNOSIS — R0602 Shortness of breath: Secondary | ICD-10-CM | POA: Diagnosis not present

## 2022-04-14 DIAGNOSIS — R002 Palpitations: Secondary | ICD-10-CM | POA: Diagnosis not present

## 2022-04-14 DIAGNOSIS — E782 Mixed hyperlipidemia: Secondary | ICD-10-CM | POA: Diagnosis not present

## 2022-04-14 DIAGNOSIS — I4891 Unspecified atrial fibrillation: Secondary | ICD-10-CM | POA: Diagnosis not present

## 2022-04-14 DIAGNOSIS — K219 Gastro-esophageal reflux disease without esophagitis: Secondary | ICD-10-CM | POA: Diagnosis not present

## 2022-04-14 DIAGNOSIS — I10 Essential (primary) hypertension: Secondary | ICD-10-CM | POA: Diagnosis not present

## 2022-04-16 ENCOUNTER — Ambulatory Visit: Payer: PPO | Admitting: Family Medicine

## 2022-04-18 ENCOUNTER — Ambulatory Visit: Payer: PPO | Admitting: Family Medicine

## 2022-04-28 ENCOUNTER — Ambulatory Visit (INDEPENDENT_AMBULATORY_CARE_PROVIDER_SITE_OTHER): Payer: PPO | Admitting: Family Medicine

## 2022-04-28 VITALS — BP 158/84 | HR 68 | Temp 97.2°F | Wt 166.4 lb

## 2022-04-28 DIAGNOSIS — E1169 Type 2 diabetes mellitus with other specified complication: Secondary | ICD-10-CM

## 2022-04-28 DIAGNOSIS — Z23 Encounter for immunization: Secondary | ICD-10-CM

## 2022-04-28 DIAGNOSIS — I480 Paroxysmal atrial fibrillation: Secondary | ICD-10-CM | POA: Diagnosis not present

## 2022-04-28 DIAGNOSIS — I10 Essential (primary) hypertension: Secondary | ICD-10-CM

## 2022-04-28 LAB — POCT GLYCOSYLATED HEMOGLOBIN (HGB A1C): Hemoglobin A1C: 7.1 % — AB (ref 4.0–5.6)

## 2022-04-28 LAB — MICROALBUMIN / CREATININE URINE RATIO
Creatinine,U: 31.7 mg/dL
Microalb Creat Ratio: 2.2 mg/g (ref 0.0–30.0)
Microalb, Ur: <0.7 mg/dL (ref 0.0–1.9)

## 2022-04-28 NOTE — Assessment & Plan Note (Signed)
Lab Results  Component Value Date   HGBA1C 7.1 (A) 04/28/2022   Improved. Cont to work on diet. Cont metformin 500 mg bid. Return 3 months

## 2022-04-28 NOTE — Progress Notes (Signed)
Subjective:     Dana Hayes is a 76 y.o. female presenting for Follow-up (DM)     HPI   #Diabetes Currently taking metformin - forgetting the evening dose  Using medications without difficulties: No - some swallowing issues Diet: not doing great with diabetic diet Exercise: not   Last HgbA1c:  Lab Results  Component Value Date   HGBA1C 7.1 (A) 04/28/2022    Diabetes Health Maintenance Due:    Diabetes Health Maintenance Due  Topic Date Due   FOOT EXAM  09/13/2022   HEMOGLOBIN A1C  10/27/2022   OPHTHALMOLOGY EXAM  12/03/2022    #Afib - on eliquis, metoprolol, and now amiodarone - had an initial episode and now a second one - those times were when she was dehydrated - went back in and was told >150 bpm and ok to take additional metoprolol  - home BP - 128-130/60-70   #should pain - bilateral - having  a hard time putting her seatbelt on - started on the left first - now on the right side too - was caring for the grandchildren - but hasn't done this recently  - hard to raise her arms above the head - more pain with lifting for a long time - rarely taking medication - just puts up with it  03/19/2022: Clinic - DM - 7.8, started metformin. Afib - metprolol 25 mg bid, eliquis. Dry mouth - she was going to discuss with Dana Hayes  Review of Systems   Social History   Tobacco Use  Smoking Status Never  Smokeless Tobacco Never        Objective:    BP Readings from Last 3 Encounters:  04/28/22 (!) 158/84  03/19/22 (!) 140/80  03/08/22 112/79   Wt Readings from Last 3 Encounters:  04/28/22 166 lb 6 oz (75.5 kg)  03/19/22 166 lb 4 oz (75.4 kg)  03/08/22 162 lb (73.5 kg)    BP (!) 158/84   Pulse 68   Temp (!) 97.2 F (36.2 C) (Temporal)   Wt 166 lb 6 oz (75.5 kg)   SpO2 97%   BMI 31.44 kg/m    Physical Exam Constitutional:      General: She is not in acute distress.    Appearance: She is well-developed. She is not diaphoretic.  HENT:      Right Ear: External ear normal.     Left Ear: External ear normal.     Nose: Nose normal.  Eyes:     Conjunctiva/sclera: Conjunctivae normal.  Cardiovascular:     Rate and Rhythm: Normal rate and regular rhythm.     Heart sounds: No murmur heard. Pulmonary:     Effort: Pulmonary effort is normal. No respiratory distress.     Breath sounds: Normal breath sounds. No wheezing.  Musculoskeletal:     Cervical back: Neck supple.  Skin:    General: Skin is warm and dry.     Capillary Refill: Capillary refill takes less than 2 seconds.  Neurological:     Mental Status: She is alert. Mental status is at baseline.  Psychiatric:        Mood and Affect: Mood normal.        Behavior: Behavior normal.           Assessment & Plan:   Problem List Items Addressed This Visit       Cardiovascular and Mediastinum   Essential hypertension (Chronic)    Controlled at home. Elevated today. Cont amlodipine  5 mg, amiodarone, metoprolol 25 mg, hctz 25 mg. Discussed considering ace/arb in the future but suspect his is white coat htn and will monitor for now.       Relevant Medications   amiodarone (PACERONE) 200 MG tablet   metoprolol succinate (TOPROL-XL) 25 MG 24 hr tablet   Paroxysmal atrial fibrillation (HCC) - Primary    Stable. Following with cardiology. Recently started amiodarone due to a second episode on metoprolol. Cont eliquis 5 mg bid. Cont metoprolol 25 mg daily. Referral to Dana Hayes for second opinion (her husband sees him). Appreciate cards support.       Relevant Medications   amiodarone (PACERONE) 200 MG tablet   metoprolol succinate (TOPROL-XL) 25 MG 24 hr tablet   Other Relevant Orders   Ambulatory referral to Cardiology     Endocrine   Type 2 diabetes mellitus with other specified complication ([facility])    Lab Results  Component Value Date   HGBA1C 7.1 (A) 04/28/2022  Improved. Cont to work on diet. Cont metformin 500 mg bid. Return 3 months      Relevant Orders    POCT glycosylated hemoglobin (Hb A1C) (Completed)   Microalbumin / creatinine urine ratio   Other Visit Diagnoses     Need for influenza vaccination       Relevant Orders   Flu Vaccine QUAD High Dose(Fluad) (Completed)        Return in about 3 months (around 07/28/2022) for Diabetes .  Lesleigh Noe, MD

## 2022-04-28 NOTE — Assessment & Plan Note (Signed)
Stable. Following with cardiology. Recently started amiodarone due to a second episode on metoprolol. Cont eliquis 5 mg bid. Cont metoprolol 25 mg daily. Referral to Dr. Saunders Revel for second opinion (her husband sees him). Appreciate cards support.

## 2022-04-28 NOTE — Assessment & Plan Note (Signed)
Controlled at home. Elevated today. Cont amlodipine 5 mg, amiodarone, metoprolol 25 mg, hctz 25 mg. Discussed considering ace/arb in the future but suspect his is white coat htn and will monitor for now.

## 2022-04-28 NOTE — Patient Instructions (Addendum)
Shoulder pain - Make a follow-up in 2-6 weeks to see Dr. Lorelei Pont - home exercises - I think this is impingement syndrome  Diabetes - continue medication - try taking with dinner - try to schedule a new patient appointment within 3-4 months  #Referral I have placed a referral to a specialist for you. You should receive a phone call from the specialty office. Make sure your voicemail is not full and that if you are able to answer your phone to unknown or new numbers.   It may take up to 2 weeks to hear about the referral. If you do not hear anything in 2 weeks, please call our office and ask to speak with the referral coordinator.  - Cardiology - Dr. Saunders Revel

## 2022-05-05 DIAGNOSIS — K219 Gastro-esophageal reflux disease without esophagitis: Secondary | ICD-10-CM | POA: Diagnosis not present

## 2022-05-05 DIAGNOSIS — I4891 Unspecified atrial fibrillation: Secondary | ICD-10-CM | POA: Diagnosis not present

## 2022-05-05 DIAGNOSIS — E119 Type 2 diabetes mellitus without complications: Secondary | ICD-10-CM | POA: Diagnosis not present

## 2022-05-05 DIAGNOSIS — E782 Mixed hyperlipidemia: Secondary | ICD-10-CM | POA: Diagnosis not present

## 2022-05-05 DIAGNOSIS — E669 Obesity, unspecified: Secondary | ICD-10-CM | POA: Diagnosis not present

## 2022-05-05 DIAGNOSIS — J45998 Other asthma: Secondary | ICD-10-CM | POA: Diagnosis not present

## 2022-05-05 DIAGNOSIS — I10 Essential (primary) hypertension: Secondary | ICD-10-CM | POA: Diagnosis not present

## 2022-05-05 DIAGNOSIS — I340 Nonrheumatic mitral (valve) insufficiency: Secondary | ICD-10-CM | POA: Diagnosis not present

## 2022-05-08 ENCOUNTER — Telehealth: Payer: Self-pay

## 2022-05-08 NOTE — Chronic Care Management (AMB) (Signed)
04/28/22   Chronic Care Management Pharmacy Assistant   Name: Dana Hayes  MRN: [id number] DOB: [birth year]   Reason for Encounter: Medication Adherence and Delivery Coordination     Recent office visits:  04/28/22-Dana Cody,MD(PCP)-f/u DM,labs( A1c 7.1,urine test normal) referral for 2nd OP cardiology,f/u 3 months  Recent consult visits:  None since last CCM contact   Hospital visits:  03/08/22-Dana Hayes ED)-Afib,  We will give a dose of IV metoprolol and get work-up to look for any causes for new A-fib with RVR including Electra abnormalities, thyroid dysfunction, PE. We discussed the blood thinner and she would like to proceed given her elevated CHA2DS2-VASc score.  Medications: Outpatient Encounter Medications as of 05/08/2022  Medication Sig Note   albuterol (VENTOLIN HFA) 108 (90 Base) MCG/ACT inhaler Inhale 2 puffs into the lungs every 6 (six) hours as needed for wheezing or shortness of breath.    amiodarone (PACERONE) 200 MG tablet Take 3 tablets by mouth daily.    amLODipine (NORVASC) 5 MG tablet Take 1 tablet (5 mg total) by mouth daily.    apixaban (ELIQUIS) 5 MG TABS tablet Take 1 tablet (5 mg total) by mouth 2 (two) times daily.    atorvastatin (LIPITOR) 10 MG tablet Take 1 tablet (10 mg total) by mouth daily.    cetirizine (ZYRTEC) 10 MG tablet Take 10 mg daily by mouth.    cholecalciferol (VITAMIN D) 1000 UNITS tablet Take 1,000 Units by mouth daily.    cyclobenzaprine (FLEXERIL) 5 MG tablet Take 1 tablet (5 mg total) by mouth 3 (three) times daily as needed for muscle spasms.    Dextromethorphan-guaiFENesin (MUCINEX DM MAXIMUM STRENGTH) 60-1200 MG TB12 Take 1 tablet by mouth as needed.    esomeprazole (NEXIUM) 40 MG capsule Take 40 mg by mouth daily as needed.    hydrochlorothiazide (HYDRODIURIL) 25 MG tablet Take 1 tablet (25 mg total) by mouth daily.    metFORMIN (GLUCOPHAGE) 500 MG tablet Take 1 tablet (500 mg total) by mouth 2 (two) times daily with a  meal.    metoprolol succinate (TOPROL-XL) 25 MG 24 hr tablet Take 25 mg by mouth daily.    minoxidil (LONITEN) 2.5 MG tablet Take 2.5 mg by mouth. 04/29/2021: Started 04/22/21 [name], MD   Multiple Vitamin (MULTIVITAMIN) tablet Take 1 tablet by mouth daily.    Facility-Administered Encounter Medications as of 05/08/2022  Medication   albuterol (PROVENTIL) (2.5 MG/3ML) 0.083% nebulizer solution 2.5 mg    BP Readings from Last 3 Encounters:  04/28/22 (!) 158/84  03/19/22 (!) 140/80  03/08/22 112/79    Lab Results  Component Value Date   HGBA1C 7.1 (A) 04/28/2022      Recent OV, Consult or Hospital visit:  04/28/22-PCP  No medication changes indicated   Last adherence delivery date:02/19/22      Patient is due for next adherence delivery on: 05/20/22  Spoke with patient on 05/09/22 reviewed medications and coordinated delivery.  This delivery to include: Adherence Packaging  90 Days  Packs: Hydrochlorothiazide 25 mg - 1 tablet daily(breakfast) Atorvastatin 10 mg - 1 tablet daily(breakfast) Amlodipine 5 mg - 1 tablet daily(breakfast) Metformin '500mg'$ - take 1 tablet breakfast 1 tablet evening meal    VIAL medications: Minoxidil 2.5 mg - take 1/2 tablet daily- will start taking with breakfast pack   Patient declined the following medications this month: none  Any concerns about your medications? No  How often do you forget or accidentally miss a dose? Never  Do you use  a pillbox? No  Is patient in packaging Yes  What is the date on your next pill pack?not Hayes to read date  Any concerns or issues with your packaging?satisfied   Refills requested from providers include:Hydrochlorothiazide, amlodipine, atorvastatin   Confirmed delivery date of 05/20/22, advised patient that pharmacy will contact them the morning of delivery.  Recent blood pressure readings are as follows:none available    Recent blood glucose readings are as follows:none available      Annual wellness visit in last year? Yes Most Recent BP reading:158/84  04/28/22  If Diabetic: Most recent A1C reading:7.1 04/28/22 Last eye exam / retinopathy screening:UTD Last diabetic foot exam:UTD  Cycle dispensing form sent to [facility], CPP for review.   Charlene Brooke, CPP notified  Avel Sensor, [name]  [phone]

## 2022-05-30 ENCOUNTER — Other Ambulatory Visit: Payer: Self-pay | Admitting: Primary Care

## 2022-06-21 NOTE — Progress Notes (Unsigned)
Cardiology Office Note  Date:  06/23/2022   ID:  Ariza, Evans 1945/12/09, MRN [id number]  PCP:  Waunita Schooner, MD   Chief Complaint  Patient presents with   New Patient (Initial Visit)    Ref by Dr. Einar Pheasant for paroxysmal a-fib. Patient c/o fatigue most days. Medications reviewed by the patient verbally.     HPI:  Dana Hayes is a 76 y.o. Has a history of Hyperlipidemia Hypertension episodic chest pain, felt secondary to GI in nature Coronary calcium 500 in 2018 Who presents by referral for paroxysmal atrial fibrillation  Last seen in the office 2018  Reports having recent stress at home, taking care of granddaughter and her kids "Worn out" Feels this contributed to episode of atrial fibrillation Seen in the emergency room March 08, 2022 for atrial fibrillation concern for her heart racing chest pain and shortness of breath.  Patient reports of increasing fatigue and just not feeling her normal self for the past week and then this morning she woke up with her heart rate beating out of her chest.   Was seen by outside cardiology, reports that echocardiogram was done, stress test done, ABIs completed These records have been requested for our system  GI upset on metformin  Blood pressure typically 277-412 systolic at home  Denies significant chest pain concerning for angina  EKG personally reviewed by myself on todays visit Shows normal sinus rhythm 85 bpm nonspecific ST abnormality in anterolateral leads  PMH:   has a past medical history of Allergy, Asthma, GERD (gastroesophageal reflux disease), History of kidney stones, Hyperlipidemia, Hypertension, and Obesity.  PSH:    Past Surgical History:  Procedure Laterality Date   ABDOMINAL HYSTERECTOMY     COLONOSCOPY WITH PROPOFOL N/A 04/26/2019   Procedure: COLONOSCOPY WITH PROPOFOL;  Surgeon: Jonathon Bellows, MD;  Location[facility]COPY;  Service: Gastroenterology;  Laterality: N/A;   LITHOTRIPSY      TIMES 2 1990     Current Outpatient Medications  Medication Sig Dispense Refill   albuterol (VENTOLIN HFA) 108 (90 Base) MCG/ACT inhaler Inhale 2 puffs into the lungs every 6 (six) hours as needed for wheezing or shortness of breath. 1 each 2   apixaban (ELIQUIS) 5 MG TABS tablet Take 1 tablet (5 mg total) by mouth 2 (two) times daily. 180 tablet 0   atorvastatin (LIPITOR) 10 MG tablet Take 1 tablet (10 mg total) by mouth daily. 90 tablet 1   cetirizine (ZYRTEC) 10 MG tablet Take 10 mg daily by mouth.     cholecalciferol (VITAMIN D) 1000 UNITS tablet Take 1,000 Units by mouth daily.     esomeprazole (NEXIUM) 40 MG capsule Take 40 mg by mouth daily as needed.     hydrochlorothiazide (HYDRODIURIL) 25 MG tablet Take 1 tablet (25 mg total) by mouth daily. 90 tablet 1   metFORMIN (GLUCOPHAGE) 500 MG tablet Take 1 tablet (500 mg total) by mouth 2 (two) times daily with a meal. 180 tablet 3   minoxidil (LONITEN) 2.5 MG tablet Take 2.5 mg by mouth.     amiodarone (PACERONE) 200 MG tablet Take 1 tablet (200 mg total) by mouth daily. 90 tablet 3   amLODipine (NORVASC) 10 MG tablet Take 1 tablet (10 mg total) by mouth daily. 90 tablet 3   cyclobenzaprine (FLEXERIL) 5 MG tablet Take 1 tablet (5 mg total) by mouth 3 (three) times daily as needed for muscle spasms. (Patient not taking: Reported on 06/23/2022) 30 tablet 0   metoprolol succinate (  TOPROL-XL) 50 MG 24 hr tablet Take 1 tablet (50 mg total) by mouth daily. 90 tablet 3   Current Facility-Administered Medications  Medication Dose Route Frequency Provider Last Rate Last Admin   albuterol (PROVENTIL) (2.5 MG/3ML) 0.083% nebulizer solution 2.5 mg  2.5 mg Nebulization Once Burnard Hawthorne, FNP         Allergies:   Codeine, Ace inhibitors, and Estradiol   Social History:  The patient  reports that she has never smoked. She has never used smokeless tobacco. She reports that she does not currently use alcohol. She reports that she does not use drugs.   Family  History:   family history includes Breast cancer in her maternal aunt; Coronary artery disease in her brother and mother; Diabetes in her mother; Heart attack in her brother; Heart disease in her mother; Liver cancer in her brother.    Review of Systems: Review of Systems  Constitutional: Negative.   HENT: Negative.    Respiratory: Negative.    Cardiovascular: Negative.   Gastrointestinal: Negative.   Musculoskeletal: Negative.   Neurological: Negative.   Psychiatric/Behavioral: Negative.    All other systems reviewed and are negative.    PHYSICAL EXAM: VS:  BP (!) 160/80 (BP Location: Left Arm, Patient Position: Sitting, Cuff Size: Normal)   Pulse 85   Ht '5\' 1"'$  (1.549 m)   Wt 167 lb 6 oz (75.9 kg)   SpO2 97%   BMI 31.63 kg/m  , BMI Body mass index is 31.63 kg/m. Constitutional:  oriented to person, place, and time. No distress.  HENT:  Head: Grossly normal Eyes:  no discharge. No scleral icterus.  Neck: No JVD, no carotid bruits  Cardiovascular: Regular rate and rhythm, no murmurs appreciated Pulmonary/Chest: Clear to auscultation bilaterally, no wheezes or rails Abdominal: Soft.  no distension.  no tenderness.  Musculoskeletal: Normal range of motion Neurological:  normal muscle tone. Coordination normal. No atrophy Skin: Skin warm and dry Psychiatric: normal affect, pleasant  Recent Labs: 03/08/2022: ALT 46; BUN 13; Creatinine, Ser 0.78; Hemoglobin 15.8; Magnesium 1.9; Platelets 369; Potassium 3.6; Sodium 136; TSH 0.373    Lipid Panel Lab Results  Component Value Date   CHOL 174 12/27/2021   HDL 53.70 12/27/2021   LDLCALC 90 12/27/2021   TRIG 153.0 (H) 12/27/2021      Wt Readings from Last 3 Encounters:  06/23/22 167 lb 6 oz (75.9 kg)  04/28/22 166 lb 6 oz (75.5 kg)  03/19/22 166 lb 4 oz (75.4 kg)     ASSESSMENT AND PLAN:  Problem List Items Addressed This Visit       Cardiology Problems   Essential hypertension (Chronic)   Relevant Medications    amiodarone (PACERONE) 200 MG tablet   amLODipine (NORVASC) 10 MG tablet   metoprolol succinate (TOPROL-XL) 50 MG 24 hr tablet   Other Relevant Orders   EKG 12-Lead   Hyperlipidemia (Chronic)   Relevant Medications   amiodarone (PACERONE) 200 MG tablet   amLODipine (NORVASC) 10 MG tablet   metoprolol succinate (TOPROL-XL) 50 MG 24 hr tablet   Paroxysmal atrial fibrillation (HCC) - Primary   Relevant Medications   amiodarone (PACERONE) 200 MG tablet   amLODipine (NORVASC) 10 MG tablet   metoprolol succinate (TOPROL-XL) 50 MG 24 hr tablet   Other Relevant Orders   EKG 12-Lead     Other   Type 2 diabetes mellitus with other specified complication (HCC)   Relevant Orders   EKG 12-Lead   Paroxysmal atrial fibrillation  Was started on metoprolol, Eliquis, amiodarone in the hospital, converting to normal sinus rhythm Has Apple Watch to monitor for arrhythmia Records requested from Dr. Humphrey Rolls for our system Recommended she increase metoprolol succinate up to 50 mg daily Continue Eliquis, amiodarone 200 daily Not a good candidate for other antiarrhythmics given underlying coronary disease  Coronary artery calcification In the setting of diabetes Previously noted on CT scan in 2018, score 500 Goal LDL less than 70, may need higher dose Lipitor if numbers stay high  Essential hypertension Recommend increasing metoprolol succinate up to 50 then in 2 weeks time would increase amlodipine up to 10 mg daily Continue HCTZ 25 mg daily    Total encounter time more than 50 minutes  Greater than 50% was spent in counseling and coordination of care with the patient    Signed, Esmond Plants, M.D., Ph.D. [place], [place]

## 2022-06-23 ENCOUNTER — Ambulatory Visit: Payer: PPO | Attending: Cardiovascular Disease | Admitting: Cardiovascular Disease

## 2022-06-23 ENCOUNTER — Encounter: Payer: Self-pay | Admitting: Cardiovascular Disease

## 2022-06-23 VITALS — BP 160/80 | HR 85 | Ht 61.0 in | Wt 167.4 lb

## 2022-06-23 DIAGNOSIS — I10 Essential (primary) hypertension: Secondary | ICD-10-CM | POA: Diagnosis not present

## 2022-06-23 DIAGNOSIS — E1169 Type 2 diabetes mellitus with other specified complication: Secondary | ICD-10-CM

## 2022-06-23 DIAGNOSIS — E782 Mixed hyperlipidemia: Secondary | ICD-10-CM | POA: Diagnosis not present

## 2022-06-23 DIAGNOSIS — I480 Paroxysmal atrial fibrillation: Secondary | ICD-10-CM | POA: Diagnosis not present

## 2022-06-23 MED ORDER — AMLODIPINE BESYLATE 10 MG PO TABS: 10.0000 mg | ORAL_TABLET | Freq: Every day | ORAL | 3 refills | Status: DC

## 2022-06-23 MED ORDER — METOPROLOL SUCCINATE ER 50 MG PO TB24: 50.0000 mg | ORAL_TABLET | Freq: Every day | ORAL | 3 refills | Status: DC

## 2022-06-23 MED ORDER — AMIODARONE HCL 200 MG PO TABS: 200.0000 mg | ORAL_TABLET | Freq: Every day | ORAL | 3 refills | Status: DC

## 2022-06-23 NOTE — Patient Instructions (Addendum)
Medication Instructions: - Your physician has recommended you make the following change in your medication:   1) INCREASE the metoprolol succinate up to 50 mg: - take 1 tablet by mouth once daily  Then in 2 weeks  2) INCREASE the amlodipine up to 10 mg: - take 1 tablet by mouth once daily  (Goal blood pressure 130 on the top number)  If you need a refill on your cardiac medications before your next appointment, please call your pharmacy.    Lab work: No new labs needed   Testing/Procedures: No new testing needed   Follow-Up: At [facility], you and your health needs are our priority.  As part of our continuing mission to provide you with exceptional heart care, we have created designated Provider Care Teams.  These Care Teams include your primary Cardiologist (physician) and Advanced Practice Providers (APPs -  Physician Assistants and Nurse Practitioners) who all work together to provide you with the care you need, when you need it.  You will need a follow up appointment in 6 months  Providers on your designated Care Team:   Murray Hodgkins, NP Christell Faith, PA-C Cadence Kathlen Mody, [place]  COVID-19 Vaccine Information can be found at: [email or web address].uk For questions related to vaccine distribution or appointments, please email [email or web address] or call [phone].

## 2022-07-27 ENCOUNTER — Other Ambulatory Visit: Payer: Self-pay

## 2022-07-27 DIAGNOSIS — I10 Essential (primary) hypertension: Secondary | ICD-10-CM | POA: Insufficient documentation

## 2022-07-27 DIAGNOSIS — Z5321 Procedure and treatment not carried out due to patient leaving prior to being seen by health care provider: Secondary | ICD-10-CM | POA: Insufficient documentation

## 2022-07-27 DIAGNOSIS — R600 Localized edema: Secondary | ICD-10-CM | POA: Insufficient documentation

## 2022-07-27 DIAGNOSIS — M7989 Other specified soft tissue disorders: Secondary | ICD-10-CM | POA: Diagnosis present

## 2022-07-27 LAB — CBC WITH DIFFERENTIAL/PLATELET
Abs Immature Granulocytes: 0.02 10*3/uL (ref 0.00–0.07)
Basophils Absolute: 0.1 10*3/uL (ref 0.0–0.1)
Basophils Relative: 1 %
Eosinophils Absolute: 0.3 10*3/uL (ref 0.0–0.5)
Eosinophils Relative: 3 %
HCT: 43.6 % (ref 36.0–46.0)
Hemoglobin: 14.3 g/dL (ref 12.0–15.0)
Immature Granulocytes: 0 %
Lymphocytes Relative: 43 %
Lymphs Abs: 4.1 10*3/uL — ABNORMAL HIGH (ref 0.7–4.0)
MCH: 27.3 pg (ref 26.0–34.0)
MCHC: 32.8 g/dL (ref 30.0–36.0)
MCV: 83.2 fL (ref 80.0–100.0)
Monocytes Absolute: 0.9 10*3/uL (ref 0.1–1.0)
Monocytes Relative: 10 %
Neutro Abs: 4.1 10*3/uL (ref 1.7–7.7)
Neutrophils Relative %: 43 %
Platelets: 371 10*3/uL (ref 150–400)
RBC: 5.24 MIL/uL — ABNORMAL HIGH (ref 3.87–5.11)
RDW: 13.7 % (ref 11.5–15.5)
WBC: 9.5 10*3/uL (ref 4.0–10.5)
nRBC: 0 % (ref 0.0–0.2)

## 2022-07-27 LAB — BASIC METABOLIC PANEL
Anion gap: 7 (ref 5–15)
BUN: 15 mg/dL (ref 8–23)
CO2: 26 mmol/L (ref 22–32)
Calcium: 9.4 mg/dL (ref 8.9–10.3)
Chloride: 107 mmol/L (ref 98–111)
Creatinine, Ser: 0.72 mg/dL (ref 0.44–1.00)
GFR, Estimated: >60 mL/min (ref >60)
Glucose, Bld: 137 mg/dL — ABNORMAL HIGH (ref 70–99)
Potassium: 3.3 mmol/L — ABNORMAL LOW (ref 3.5–5.1)
Sodium: 140 mmol/L (ref 135–145)

## 2022-07-27 LAB — TROPONIN I (HIGH SENSITIVITY): Troponin I (High Sensitivity): 4 ng/L (ref <18)

## 2022-07-27 NOTE — ED Triage Notes (Signed)
Pt arrives via POV with CC of bilateral lower leg swelling that began yesterday after pt had been on feet all day. Pitting edema on assessment. Denies hx of CHF. Reports hx of hypertension.

## 2022-07-28 ENCOUNTER — Emergency Department
Admission: EM | Admit: 2022-07-28 | Discharge: 2022-07-28 | Discharge status: Against Medical Advice | Payer: PPO | Attending: Emergency Medicine | Admitting: Emergency Medicine

## 2022-07-29 NOTE — Progress Notes (Signed)
    Clements Toro T. Lestat Golob, MD, [place] at [facility] [place], [address]  Phone: [phone]  FAX: Dana Hayes - 76 y.o. female  MRN [id number]  Date of Birth: 01/16/1946  Date: 07/30/2022  PCP: Waunita Schooner, MD  Referral: Waunita Schooner, MD  No chief complaint on file.  Subjective:   NORALEE Hayes is a 76 y.o. very pleasant female patient with There is no height or weight on file to calculate BMI. who presents with the following:  Patient is a 76 year old former patient of mine who presents with some lower extremity edema.  She does have a history of paroxysmal A-fib and is on Eliquis.  No known history of congestive  Review of Systems is noted in the HPI, as appropriate  Objective:   There were no vitals taken for this visit.  GEN: No acute distress; alert,appropriate. PULM: Breathing comfortably in no respiratory distress PSYCH: Normally interactive.   Laboratory and Imaging Data:  Assessment and Plan:   ***

## 2022-07-30 ENCOUNTER — Ambulatory Visit (INDEPENDENT_AMBULATORY_CARE_PROVIDER_SITE_OTHER): Payer: PPO | Admitting: Family Medicine

## 2022-07-30 ENCOUNTER — Encounter: Payer: Self-pay | Admitting: Family Medicine

## 2022-07-30 VITALS — BP 140/80 | HR 71 | Temp 98.6°F | Ht 61.0 in | Wt 167.4 lb

## 2022-07-30 DIAGNOSIS — E1169 Type 2 diabetes mellitus with other specified complication: Secondary | ICD-10-CM

## 2022-07-30 DIAGNOSIS — R600 Localized edema: Secondary | ICD-10-CM

## 2022-07-30 DIAGNOSIS — I480 Paroxysmal atrial fibrillation: Secondary | ICD-10-CM

## 2022-07-30 LAB — BRAIN NATRIURETIC PEPTIDE: Pro B Natriuretic peptide (BNP): 14 pg/mL (ref 0.0–100.0)

## 2022-08-01 ENCOUNTER — Telehealth: Payer: Self-pay

## 2022-08-01 NOTE — Telephone Encounter (Signed)
     Patient  visit on 12/24  at [place]   Have you been able to follow up with your primary care physician? Yes   The patient was or was not able to obtain any needed medicine or equipment. none  Are there diet recommendations that you are having difficulty following? Na   Patient expresses understanding of discharge instructions and education provided has no other needs at this time.  Yes      [place], [facility], Care Management  [phone] [address], [place], [place] [address] Phone: [phone] Email: [email or web address]

## 2022-08-05 ENCOUNTER — Other Ambulatory Visit: Payer: Self-pay

## 2022-08-05 ENCOUNTER — Telehealth: Payer: Self-pay

## 2022-08-05 ENCOUNTER — Telehealth: Payer: Self-pay | Admitting: Cardiovascular Disease

## 2022-08-05 MED ORDER — ATORVASTATIN CALCIUM 10 MG PO TABS
10.0000 mg | ORAL_TABLET | Freq: Every day | ORAL | 0 refills | Status: AC
Start: 2022-08-05 — End: ?

## 2022-08-05 MED ORDER — APIXABAN 5 MG PO TABS: 5.0000 mg | ORAL_TABLET | Freq: Two times a day (BID) | ORAL | 1 refills | Status: DC

## 2022-08-05 MED ORDER — HYDROCHLOROTHIAZIDE 25 MG PO TABS: 25.0000 mg | ORAL_TABLET | Freq: Every day | ORAL | 0 refills | Status: AC

## 2022-08-05 NOTE — Telephone Encounter (Signed)
Refill request

## 2022-08-05 NOTE — Telephone Encounter (Signed)
error 

## 2022-08-05 NOTE — Telephone Encounter (Signed)
Prescription refill request for Eliquis received. Indication:a fib Last office visit:11/23 Scr:0.7 Age: 77 Weight:75.9 kg  Prescription refilled

## 2022-08-05 NOTE — Telephone Encounter (Signed)
*  STAT* If patient is at the pharmacy, call can be transferred to refill team.   1. Which medications need to be refilled? (please list name of each medication and dose if known)    apixaban (ELIQUIS) 5 MG TABS tablet    2. Which pharmacy/location (including street and city if local pharmacy) is medication to be sent to? [facility] [id number] - [address], [place]   3. Do they need a 30 day or 90 day supply? [place]

## 2022-08-21 ENCOUNTER — Telehealth: Payer: PPO

## 2022-09-02 ENCOUNTER — Telehealth: Payer: Self-pay | Admitting: Cardiovascular Disease

## 2022-09-02 NOTE — Telephone Encounter (Signed)
Pt c/o swelling: STAT is pt has developed SOB within 24 hours  If swelling, where is the swelling located? Ankles and Legs  How much weight have you gained and in what time span? NA  Have you gained 3 pounds in a day or 5 pounds in a week? NA  Do you have a log of your daily weights (if so, list)? 155-160  Are you currently taking a fluid pill? Yes  Are you currently SOB? No - Tired  Have you traveled recently? No

## 2022-09-02 NOTE — Telephone Encounter (Signed)
Pt called to report off and on bilateral ankle and leg swelling since Christmas. Pt stated Christmas Eve she had been on her feet for a long period of time and swelling increased above her knees. She was instructed to report to ER for further evaluations. She stated she has since been evaluated by Dr. Edilia Bo but still have not received answers. She state swelling resolves with elevation but once on her feet the swelling returns  Pt denies significant weight increase and stated she can't really tell if she has SOB.    Will forward to MD for recommendations.

## 2022-09-03 MED ORDER — LOSARTAN POTASSIUM 100 MG PO TABS: 100.0000 mg | ORAL_TABLET | Freq: Every day | ORAL | 3 refills | Status: DC

## 2022-09-03 NOTE — Telephone Encounter (Signed)
Pt made aware of MD's recommendations and verbalized understanding. Amlodipine d/c Losartan 100 mg daily sent to requested pharmacy  Minna Merritts, MD  [facility] [place] [phone] minutes ago (12:41 PM)    Suspect leg swelling could be coming from higher dose amlodipine Would recommend she stop amlodipine for blood pressure In place of the amlodipine would start losartan 100 mg daily Would monitor blood pressure over the next week or 2 to make sure blood pressure is adequately controlled Leg swelling should slowly improve over the next several weeks as amlodipine comes out of her system For swelling at the end of the day could wear compression hose until symptoms eventually resolve Thx TGollan

## 2022-09-25 DIAGNOSIS — E782 Mixed hyperlipidemia: Secondary | ICD-10-CM | POA: Diagnosis not present

## 2022-09-25 DIAGNOSIS — Z0000 Encounter for general adult medical examination without abnormal findings: Secondary | ICD-10-CM | POA: Diagnosis not present

## 2022-09-25 DIAGNOSIS — Z1231 Encounter for screening mammogram for malignant neoplasm of breast: Secondary | ICD-10-CM | POA: Diagnosis not present

## 2022-09-25 DIAGNOSIS — E1169 Type 2 diabetes mellitus with other specified complication: Secondary | ICD-10-CM | POA: Diagnosis not present

## 2022-09-25 DIAGNOSIS — I480 Paroxysmal atrial fibrillation: Secondary | ICD-10-CM | POA: Diagnosis not present

## 2022-09-25 DIAGNOSIS — I10 Essential (primary) hypertension: Secondary | ICD-10-CM | POA: Diagnosis not present

## 2022-09-25 DIAGNOSIS — Z780 Asymptomatic menopausal state: Secondary | ICD-10-CM | POA: Diagnosis not present

## 2022-09-25 DIAGNOSIS — J4531 Mild persistent asthma with (acute) exacerbation: Secondary | ICD-10-CM | POA: Diagnosis not present

## 2022-09-25 DIAGNOSIS — K219 Gastro-esophageal reflux disease without esophagitis: Secondary | ICD-10-CM | POA: Diagnosis not present

## 2022-09-30 ENCOUNTER — Other Ambulatory Visit: Payer: Self-pay | Admitting: Internal Medicine

## 2022-09-30 DIAGNOSIS — Z1231 Encounter for screening mammogram for malignant neoplasm of breast: Secondary | ICD-10-CM

## 2022-10-01 DIAGNOSIS — M8588 Other specified disorders of bone density and structure, other site: Secondary | ICD-10-CM | POA: Diagnosis not present

## 2022-10-16 ENCOUNTER — Ambulatory Visit
Admission: RE | Admit: 2022-10-16 | Discharge: 2022-10-16 | Disposition: A | Discharge status: Home / Self Care | Payer: HMO | Source: Ambulatory Visit | Attending: Internal Medicine | Admitting: Internal Medicine

## 2022-10-16 DIAGNOSIS — Z1231 Encounter for screening mammogram for malignant neoplasm of breast: Secondary | ICD-10-CM | POA: Insufficient documentation

## 2022-10-20 DIAGNOSIS — R899 Unspecified abnormal finding in specimens from other organs, systems and tissues: Secondary | ICD-10-CM | POA: Diagnosis not present

## 2022-10-20 DIAGNOSIS — R8290 Unspecified abnormal findings in urine: Secondary | ICD-10-CM | POA: Diagnosis not present

## 2022-11-12 DIAGNOSIS — M5442 Lumbago with sciatica, left side: Secondary | ICD-10-CM | POA: Diagnosis not present

## 2022-11-12 DIAGNOSIS — I480 Paroxysmal atrial fibrillation: Secondary | ICD-10-CM | POA: Diagnosis not present

## 2022-11-12 DIAGNOSIS — M5136 Other intervertebral disc degeneration, lumbar region: Secondary | ICD-10-CM | POA: Diagnosis not present

## 2022-11-12 DIAGNOSIS — E1169 Type 2 diabetes mellitus with other specified complication: Secondary | ICD-10-CM | POA: Diagnosis not present

## 2022-12-11 DIAGNOSIS — L658 Other specified nonscarring hair loss: Secondary | ICD-10-CM | POA: Diagnosis not present

## 2023-01-04 NOTE — Progress Notes (Unsigned)
Cardiology Office Note  Date:  01/05/2023   ID:  Hayes, Dana September 13, 1945, MRN [id number]  PCP:  Enid Baas, MD   Chief Complaint  Patient presents with   6 month follow up     Patient c/o fatigue and exhausted. Medications reviewed by the patient verbally.     HPI:  Dana Hayes is a 77 y.o. has a PMH of Hyperlipidemia Hypertension episodic chest pain, felt secondary to GI in nature Coronary calcium 500 in 2018 Atrial fibrillation August 2023 Who presents for follow-up of her coronary calcification and paroxysmal atrial fibrillation  Last seen in the office November 2023 Prior to that was seen in 2018  Reports having poor sleep,  polyuria Tired in the day No regular exercise program  No arrhythmia sx Remains on amiodarone 200 daily, metoprolol succinate 50 daily, Eliquis Reports missing Eliquis, often will take it once a day, does not like taking too many medications  Weight up 15 pounds since the end of 2023  Labs reviewed A1c 7.4 Total chol 172, LDL 82  EKG personally reviewed by myself on todays visit Normal sinus rhythm rate 67 bpm nonspecific ST abnormality  Other past medical history reviewed Seen in the emergency room March 08, 2022 for atrial fibrillation In the setting of stress concern for her heart racing chest pain and shortness of breath.  Patient reports of increasing fatigue and just not feeling her normal self for the past week and then this morning she woke up with her heart rate beating out of her chest.   Was seen by outside cardiology, reports that echocardiogram was done, stress test done, ABIs completed  GI upset on metformin  PMH:   has a past medical history of Allergy, Asthma, GERD (gastroesophageal reflux disease), History of kidney stones, Hyperlipidemia, Hypertension, and Obesity.  PSH:    Past Surgical History:  Procedure Laterality Date   ABDOMINAL HYSTERECTOMY     COLONOSCOPY WITH PROPOFOL N/A 04/26/2019   Procedure:  COLONOSCOPY WITH PROPOFOL;  Surgeon: Wyline Mood, MD;  Location: [facility] ENDOSCOPY;  Service: Gastroenterology;  Laterality: N/A;   LITHOTRIPSY      TIMES 2 1990    Current Outpatient Medications  Medication Sig Dispense Refill   albuterol (VENTOLIN HFA) 108 (90 Base) MCG/ACT inhaler Inhale 2 puffs into the lungs every 6 (six) hours as needed for wheezing or shortness of breath. 1 each 2   amiodarone (PACERONE) 200 MG tablet Take 1 tablet (200 mg total) by mouth daily. 90 tablet 3   amLODipine (NORVASC) 5 MG tablet Take 5 mg by mouth daily.     apixaban (ELIQUIS) 5 MG TABS tablet Take 1 tablet (5 mg total) by mouth 2 (two) times daily. 180 tablet 1   atorvastatin (LIPITOR) 10 MG tablet Take 1 tablet (10 mg total) by mouth daily. 60 tablet 0   cetirizine (ZYRTEC) 10 MG tablet Take 10 mg daily by mouth.     cholecalciferol (VITAMIN D) 1000 UNITS tablet Take 1,000 Units by mouth daily.     cyclobenzaprine (FLEXERIL) 5 MG tablet Take 1 tablet (5 mg total) by mouth 3 (three) times daily as needed for muscle spasms. 30 tablet 0   esomeprazole (NEXIUM) 40 MG capsule Take 40 mg by mouth daily as needed.     glipiZIDE (GLUCOTROL XL) 2.5 MG 24 hr tablet Take 1 tablet by mouth daily.     hydrochlorothiazide (HYDRODIURIL) 25 MG tablet Take 1 tablet (25 mg total) by mouth daily. 60 tablet  0   losartan (COZAAR) 100 MG tablet Take 1 tablet (100 mg total) by mouth daily. 90 tablet 3   meloxicam (MOBIC) 15 MG tablet Take 15 mg by mouth daily.     methocarbamol (ROBAXIN) 500 MG tablet Take 500 mg by mouth every 6 (six) hours as needed.     metoprolol succinate (TOPROL-XL) 50 MG 24 hr tablet Take 1 tablet (50 mg total) by mouth daily. 90 tablet 3   minoxidil (LONITEN) 2.5 MG tablet Take 2.5 mg by mouth.     Current Facility-Administered Medications  Medication Dose Route Frequency Provider Last Rate Last Admin   albuterol (PROVENTIL) (2.5 MG/3ML) 0.083% nebulizer solution 2.5 mg  2.5 mg Nebulization Once Allegra Grana, FNP         Allergies:   Codeine, Ace inhibitors, and Estradiol   Social History:  The patient  reports that she has never smoked. She has never used smokeless tobacco. She reports that she does not currently use alcohol. She reports that she does not use drugs.   Family History:   family history includes Breast cancer in her maternal aunt; Coronary artery disease in her brother and mother; Diabetes in her mother; Heart attack in her brother; Heart disease in her mother; Liver cancer in her brother.    Review of Systems: Review of Systems  Constitutional: Negative.   HENT: Negative.    Respiratory: Negative.    Cardiovascular: Negative.   Gastrointestinal: Negative.   Musculoskeletal: Negative.   Neurological: Negative.   Psychiatric/Behavioral: Negative.    All other systems reviewed and are negative.    PHYSICAL EXAM: VS:  BP 120/64 (BP Location: Left Arm, Patient Position: Sitting, Cuff Size: Normal)   Pulse 67   Ht 5\' 1"  (1.549 m)   Wt 175 lb 8 oz (79.6 kg)   SpO2 96%   BMI 33.16 kg/m  , BMI Body mass index is 33.16 kg/m. Constitutional:  oriented to person, place, and time. No distress.  HENT:  Head: Grossly normal Eyes:  no discharge. No scleral icterus.  Neck: No JVD, no carotid bruits  Cardiovascular: Regular rate and rhythm, no murmurs appreciated Pulmonary/Chest: Clear to auscultation bilaterally, no wheezes or rails Abdominal: Soft.  no distension.  no tenderness.  Musculoskeletal: Normal range of motion Neurological:  normal muscle tone. Coordination normal. No atrophy Skin: Skin warm and dry Psychiatric: normal affect, pleasant  Recent Labs: 03/08/2022: ALT 46; Magnesium 1.9; TSH 0.373 07/27/2022: BUN 15; Creatinine, Ser 0.72; Hemoglobin 14.3; Platelets 371; Potassium 3.3; Sodium 140 07/30/2022: Pro B Natriuretic peptide (BNP) 14.0    Lipid Panel Lab Results  Component Value Date   CHOL 174 12/27/2021   HDL 53.70 12/27/2021   LDLCALC 90  12/27/2021   TRIG 153.0 (H) 12/27/2021      Wt Readings from Last 3 Encounters:  01/05/23 175 lb 8 oz (79.6 kg)  07/30/22 167 lb 6 oz (75.9 kg)  07/27/22 160 lb (72.6 kg)     ASSESSMENT AND PLAN:  Problem List Items Addressed This Visit       Cardiology Problems   Paroxysmal atrial fibrillation (HCC) - Primary (Chronic)   Relevant Medications   amLODipine (NORVASC) 5 MG tablet   Essential hypertension (Chronic)   Relevant Medications   amLODipine (NORVASC) 5 MG tablet   Hyperlipidemia (Chronic)   Relevant Medications   amLODipine (NORVASC) 5 MG tablet     Other   Type 2 diabetes mellitus with other specified complication (HCC)   Relevant  Medications   glipiZIDE (GLUCOTROL XL) 2.5 MG 24 hr tablet  Paroxysmal atrial fibrillation Recommend she stay on metoprolol, Eliquis, amiodarone  Surveillance labs performed by primary care Denies symptoms concerning for breakthrough arrhythmia  Coronary artery calcification In the setting of diabetes Previously noted on CT scan in 2018, score 500  Hyperlipidemia Cholesterol above goal May need to consider adding Zetia if no improvement in numbers through weight loss  Essential hypertension Blood pressure is well controlled on today's visit. No changes made to the medications.  Obesity Consider Ozempic in the setting of diabetes    Total encounter time more than 50 minutes  Greater than 50% was spent in counseling and coordination of care with the patient    Signed, Dossie Arbour, M.D., Ph.D. [facility] [place], [place] [phone]

## 2023-01-05 ENCOUNTER — Ambulatory Visit: Payer: HMO | Attending: Cardiovascular Disease | Admitting: Cardiovascular Disease

## 2023-01-05 ENCOUNTER — Encounter: Payer: Self-pay | Admitting: Cardiovascular Disease

## 2023-01-05 VITALS — BP 120/64 | HR 67 | Ht 61.0 in | Wt 175.5 lb

## 2023-01-05 DIAGNOSIS — I10 Essential (primary) hypertension: Secondary | ICD-10-CM

## 2023-01-05 DIAGNOSIS — I480 Paroxysmal atrial fibrillation: Secondary | ICD-10-CM | POA: Diagnosis not present

## 2023-01-05 DIAGNOSIS — E782 Mixed hyperlipidemia: Secondary | ICD-10-CM | POA: Diagnosis not present

## 2023-01-05 DIAGNOSIS — E1169 Type 2 diabetes mellitus with other specified complication: Secondary | ICD-10-CM | POA: Diagnosis not present

## 2023-01-05 DIAGNOSIS — Z7984 Long term (current) use of oral hypoglycemic drugs: Secondary | ICD-10-CM

## 2023-01-05 MED ORDER — METOPROLOL SUCCINATE ER 50 MG PO TB24: 50.0000 mg | ORAL_TABLET | Freq: Every day | ORAL | 3 refills | Status: DC

## 2023-01-05 NOTE — Patient Instructions (Signed)
Medication Instructions:  No changes  If you need a refill on your cardiac medications before your next appointment, please call your pharmacy.   Lab work: No new labs needed  Testing/Procedures: No new testing needed  Follow-Up: At [facility], you and your health needs are our priority.  As part of our continuing mission to provide you with exceptional heart care, we have created designated Provider Care Teams.  These Care Teams include your primary Cardiologist (physician) and Advanced Practice Providers (APPs -  Physician Assistants and Nurse Practitioners) who all work together to provide you with the care you need, when you need it.  You will need a follow up appointment in 12 months  Providers on your designated Care Team:   Christopher Berge, NP Ryan Dunn, PA-C Cadence Furth, PA-C  COVID-19 Vaccine Information can be found at: [email or web address] For questions related to vaccine distribution or appointments, please email [email or web address] or call [phone].   

## 2023-01-13 DIAGNOSIS — R399 Unspecified symptoms and signs involving the genitourinary system: Secondary | ICD-10-CM | POA: Diagnosis not present

## 2023-01-27 DIAGNOSIS — E1169 Type 2 diabetes mellitus with other specified complication: Secondary | ICD-10-CM | POA: Diagnosis not present

## 2023-01-27 DIAGNOSIS — R053 Chronic cough: Secondary | ICD-10-CM | POA: Diagnosis not present

## 2023-01-27 DIAGNOSIS — N390 Urinary tract infection, site not specified: Secondary | ICD-10-CM | POA: Diagnosis not present

## 2023-01-27 DIAGNOSIS — N3001 Acute cystitis with hematuria: Secondary | ICD-10-CM | POA: Diagnosis not present

## 2023-01-27 DIAGNOSIS — R319 Hematuria, unspecified: Secondary | ICD-10-CM | POA: Diagnosis not present

## 2023-01-27 DIAGNOSIS — R918 Other nonspecific abnormal finding of lung field: Secondary | ICD-10-CM | POA: Diagnosis not present

## 2023-01-29 ENCOUNTER — Other Ambulatory Visit: Payer: Self-pay | Admitting: Internal Medicine

## 2023-01-29 ENCOUNTER — Ambulatory Visit
Admission: RE | Admit: 2023-01-29 | Discharge: 2023-01-29 | Disposition: A | Discharge status: Home / Self Care | Payer: HMO | Source: Ambulatory Visit | Attending: Internal Medicine | Admitting: Internal Medicine

## 2023-01-29 DIAGNOSIS — K7689 Other specified diseases of liver: Secondary | ICD-10-CM | POA: Diagnosis not present

## 2023-01-29 DIAGNOSIS — R1030 Lower abdominal pain, unspecified: Secondary | ICD-10-CM

## 2023-01-29 DIAGNOSIS — R109 Unspecified abdominal pain: Secondary | ICD-10-CM | POA: Diagnosis not present

## 2023-01-29 DIAGNOSIS — K5730 Diverticulosis of large intestine without perforation or abscess without bleeding: Secondary | ICD-10-CM | POA: Diagnosis not present

## 2023-01-29 DIAGNOSIS — K8020 Calculus of gallbladder without cholecystitis without obstruction: Secondary | ICD-10-CM | POA: Diagnosis not present

## 2023-02-23 DIAGNOSIS — L570 Actinic keratosis: Secondary | ICD-10-CM | POA: Diagnosis not present

## 2023-02-23 DIAGNOSIS — D492 Neoplasm of unspecified behavior of bone, soft tissue, and skin: Secondary | ICD-10-CM | POA: Diagnosis not present

## 2023-02-23 DIAGNOSIS — Q828 Other specified congenital malformations of skin: Secondary | ICD-10-CM | POA: Diagnosis not present

## 2023-04-03 DIAGNOSIS — L658 Other specified nonscarring hair loss: Secondary | ICD-10-CM | POA: Diagnosis not present

## 2023-04-03 DIAGNOSIS — D225 Melanocytic nevi of trunk: Secondary | ICD-10-CM | POA: Diagnosis not present

## 2023-04-03 DIAGNOSIS — D2261 Melanocytic nevi of right upper limb, including shoulder: Secondary | ICD-10-CM | POA: Diagnosis not present

## 2023-04-03 DIAGNOSIS — D2271 Melanocytic nevi of right lower limb, including hip: Secondary | ICD-10-CM | POA: Diagnosis not present

## 2023-04-03 DIAGNOSIS — D2272 Melanocytic nevi of left lower limb, including hip: Secondary | ICD-10-CM | POA: Diagnosis not present

## 2023-04-03 DIAGNOSIS — L570 Actinic keratosis: Secondary | ICD-10-CM | POA: Diagnosis not present

## 2023-04-03 DIAGNOSIS — L821 Other seborrheic keratosis: Secondary | ICD-10-CM | POA: Diagnosis not present

## 2023-04-03 DIAGNOSIS — D2262 Melanocytic nevi of left upper limb, including shoulder: Secondary | ICD-10-CM | POA: Diagnosis not present

## 2023-04-03 DIAGNOSIS — L565 Disseminated superficial actinic porokeratosis (DSAP): Secondary | ICD-10-CM | POA: Diagnosis not present

## 2023-04-07 DIAGNOSIS — E1169 Type 2 diabetes mellitus with other specified complication: Secondary | ICD-10-CM | POA: Diagnosis not present

## 2023-04-07 DIAGNOSIS — I480 Paroxysmal atrial fibrillation: Secondary | ICD-10-CM | POA: Diagnosis not present

## 2023-04-07 DIAGNOSIS — R053 Chronic cough: Secondary | ICD-10-CM | POA: Diagnosis not present

## 2023-04-07 DIAGNOSIS — M7551 Bursitis of right shoulder: Secondary | ICD-10-CM | POA: Diagnosis not present

## 2023-04-24 DIAGNOSIS — M7531 Calcific tendinitis of right shoulder: Secondary | ICD-10-CM | POA: Diagnosis not present

## 2023-04-24 DIAGNOSIS — M7581 Other shoulder lesions, right shoulder: Secondary | ICD-10-CM | POA: Diagnosis not present

## 2023-04-24 DIAGNOSIS — M25811 Other specified joint disorders, right shoulder: Secondary | ICD-10-CM | POA: Diagnosis not present

## 2023-05-26 DIAGNOSIS — L570 Actinic keratosis: Secondary | ICD-10-CM | POA: Diagnosis not present

## 2023-05-26 DIAGNOSIS — Q828 Other specified congenital malformations of skin: Secondary | ICD-10-CM | POA: Diagnosis not present

## 2023-06-15 DIAGNOSIS — H43813 Vitreous degeneration, bilateral: Secondary | ICD-10-CM | POA: Diagnosis not present

## 2023-06-15 DIAGNOSIS — E119 Type 2 diabetes mellitus without complications: Secondary | ICD-10-CM | POA: Diagnosis not present

## 2023-06-17 ENCOUNTER — Other Ambulatory Visit: Payer: Self-pay

## 2023-06-17 MED ORDER — AMIODARONE HCL 200 MG PO TABS: 200.0000 mg | ORAL_TABLET | Freq: Every day | ORAL | 1 refills | Status: DC

## 2023-06-28 ENCOUNTER — Other Ambulatory Visit: Payer: Self-pay | Admitting: Cardiovascular Disease

## 2023-07-09 DIAGNOSIS — I10 Essential (primary) hypertension: Secondary | ICD-10-CM | POA: Diagnosis not present

## 2023-07-09 DIAGNOSIS — I480 Paroxysmal atrial fibrillation: Secondary | ICD-10-CM | POA: Diagnosis not present

## 2023-07-09 DIAGNOSIS — E1169 Type 2 diabetes mellitus with other specified complication: Secondary | ICD-10-CM | POA: Diagnosis not present

## 2023-07-09 DIAGNOSIS — M7551 Bursitis of right shoulder: Secondary | ICD-10-CM | POA: Diagnosis not present

## 2023-07-10 ENCOUNTER — Other Ambulatory Visit: Payer: Self-pay | Admitting: Cardiovascular Disease

## 2023-07-10 DIAGNOSIS — I480 Paroxysmal atrial fibrillation: Secondary | ICD-10-CM

## 2023-07-10 NOTE — Telephone Encounter (Signed)
Prescription refill request for Eliquis received. Indication: a fib Last office visit: 01/05/23 Scr: 0.8 07/09/23 care everywhere Age: 77 Weight: 79 kg

## 2023-07-15 DIAGNOSIS — R300 Dysuria: Secondary | ICD-10-CM | POA: Diagnosis not present

## 2023-09-03 DIAGNOSIS — L658 Other specified nonscarring hair loss: Secondary | ICD-10-CM | POA: Diagnosis not present

## 2023-09-16 ENCOUNTER — Other Ambulatory Visit: Payer: Self-pay | Admitting: Internal Medicine

## 2023-09-16 ENCOUNTER — Encounter: Payer: Self-pay | Admitting: Internal Medicine

## 2023-09-16 DIAGNOSIS — Z1231 Encounter for screening mammogram for malignant neoplasm of breast: Secondary | ICD-10-CM

## 2023-09-23 ENCOUNTER — Other Ambulatory Visit: Payer: Self-pay | Admitting: Cardiovascular Disease

## 2023-10-14 DIAGNOSIS — J208 Acute bronchitis due to other specified organisms: Secondary | ICD-10-CM | POA: Diagnosis not present

## 2023-10-14 DIAGNOSIS — R0981 Nasal congestion: Secondary | ICD-10-CM | POA: Diagnosis not present

## 2023-10-14 DIAGNOSIS — I10 Essential (primary) hypertension: Secondary | ICD-10-CM | POA: Diagnosis not present

## 2023-10-14 DIAGNOSIS — J209 Acute bronchitis, unspecified: Secondary | ICD-10-CM | POA: Diagnosis not present

## 2023-10-14 DIAGNOSIS — E1169 Type 2 diabetes mellitus with other specified complication: Secondary | ICD-10-CM | POA: Diagnosis not present

## 2023-10-14 DIAGNOSIS — J219 Acute bronchiolitis, unspecified: Secondary | ICD-10-CM | POA: Diagnosis not present

## 2023-10-14 DIAGNOSIS — I480 Paroxysmal atrial fibrillation: Secondary | ICD-10-CM | POA: Diagnosis not present

## 2023-10-19 ENCOUNTER — Other Ambulatory Visit: Payer: Self-pay | Admitting: Internal Medicine

## 2023-10-19 ENCOUNTER — Ambulatory Visit
Admission: RE | Admit: 2023-10-19 | Discharge: 2023-10-19 | Disposition: A | Discharge status: Home / Self Care | Source: Ambulatory Visit | Attending: Internal Medicine | Admitting: Internal Medicine

## 2023-10-19 DIAGNOSIS — R042 Hemoptysis: Secondary | ICD-10-CM

## 2023-10-19 DIAGNOSIS — J984 Other disorders of lung: Secondary | ICD-10-CM | POA: Diagnosis not present

## 2023-10-19 DIAGNOSIS — J40 Bronchitis, not specified as acute or chronic: Secondary | ICD-10-CM | POA: Diagnosis not present

## 2023-10-28 ENCOUNTER — Ambulatory Visit
Admission: RE | Admit: 2023-10-28 | Discharge: 2023-10-28 | Disposition: A | Discharge status: Home / Self Care | Source: Ambulatory Visit | Attending: Internal Medicine | Admitting: Internal Medicine

## 2023-10-28 DIAGNOSIS — Z1231 Encounter for screening mammogram for malignant neoplasm of breast: Secondary | ICD-10-CM | POA: Insufficient documentation

## 2023-11-04 DIAGNOSIS — M5442 Lumbago with sciatica, left side: Secondary | ICD-10-CM | POA: Diagnosis not present

## 2023-11-04 DIAGNOSIS — I10 Essential (primary) hypertension: Secondary | ICD-10-CM | POA: Diagnosis not present

## 2023-11-04 DIAGNOSIS — I480 Paroxysmal atrial fibrillation: Secondary | ICD-10-CM | POA: Diagnosis not present

## 2023-11-04 DIAGNOSIS — E1169 Type 2 diabetes mellitus with other specified complication: Secondary | ICD-10-CM | POA: Diagnosis not present

## 2023-11-04 DIAGNOSIS — I2510 Atherosclerotic heart disease of native coronary artery without angina pectoris: Secondary | ICD-10-CM | POA: Diagnosis not present

## 2023-11-04 DIAGNOSIS — R053 Chronic cough: Secondary | ICD-10-CM | POA: Diagnosis not present

## 2023-12-20 ENCOUNTER — Other Ambulatory Visit: Payer: Self-pay | Admitting: Cardiovascular Disease

## 2024-01-01 DIAGNOSIS — E1169 Type 2 diabetes mellitus with other specified complication: Secondary | ICD-10-CM | POA: Diagnosis not present

## 2024-01-08 DIAGNOSIS — Z1331 Encounter for screening for depression: Secondary | ICD-10-CM | POA: Diagnosis not present

## 2024-01-08 DIAGNOSIS — E1169 Type 2 diabetes mellitus with other specified complication: Secondary | ICD-10-CM | POA: Diagnosis not present

## 2024-01-08 DIAGNOSIS — I480 Paroxysmal atrial fibrillation: Secondary | ICD-10-CM | POA: Diagnosis not present

## 2024-01-08 DIAGNOSIS — Z0000 Encounter for general adult medical examination without abnormal findings: Secondary | ICD-10-CM | POA: Diagnosis not present

## 2024-01-08 DIAGNOSIS — I10 Essential (primary) hypertension: Secondary | ICD-10-CM | POA: Diagnosis not present

## 2024-01-20 DIAGNOSIS — R399 Unspecified symptoms and signs involving the genitourinary system: Secondary | ICD-10-CM | POA: Diagnosis not present

## 2024-01-20 DIAGNOSIS — R300 Dysuria: Secondary | ICD-10-CM | POA: Diagnosis not present

## 2024-02-02 ENCOUNTER — Encounter: Payer: Self-pay | Admitting: Cardiovascular Disease

## 2024-02-02 ENCOUNTER — Ambulatory Visit: Attending: Cardiovascular Disease | Admitting: Cardiovascular Disease

## 2024-02-02 VITALS — BP 140/76 | HR 83 | Ht 61.0 in | Wt 177.2 lb

## 2024-02-02 DIAGNOSIS — R0602 Shortness of breath: Secondary | ICD-10-CM | POA: Diagnosis not present

## 2024-02-02 DIAGNOSIS — I480 Paroxysmal atrial fibrillation: Secondary | ICD-10-CM

## 2024-02-02 DIAGNOSIS — I10 Essential (primary) hypertension: Secondary | ICD-10-CM

## 2024-02-02 MED ORDER — EZETIMIBE 10 MG PO TABS: 10.0000 mg | ORAL_TABLET | Freq: Every day | ORAL | 3 refills | Status: AC

## 2024-02-02 NOTE — Progress Notes (Signed)
 Cardiology Office Note  Date:  02/02/2024   ID:  Dana Hayes, Dana Hayes [birth year], MRN [id number]  PCP:  Sherial Bail, MD   Chief Complaint  Patient presents with   Follow-up    Patient reports short of breath with exertion for a two months. Denies swelling. Patient reports that she had a chest CT which showed Extensive coronary artery atherosclerosis. PCP wanted to make Dr. Illana Nolting aware.     HPI:  Dana Hayes is a 78 y.o. has a PMH of Hyperlipidemia Hypertension episodic chest pain, felt secondary to GI in nature Coronary calcium  500 in 2018 Atrial fibrillation August 2023 Who presents for follow-up of her coronary calcification and paroxysmal atrial fibrillation  Last seen in the office 6/24  Reports that she is active at baseline, walks, babysits 3 grandbabies,  Does house work, gardens, lots of flowers No regular exercise program Has some shortness of breath on exertion particularly in the heat and with stairs Recovers relatively quickly  CT chest 3/25 reviewed Extensive coronary artery atherosclerosis.  Coronary calcification previously noted 2018  Denies having A-fib spells   on amiodarone  200 daily, metoprolol  succinate 50 daily, Eliquis  twice daily  Weight stable 177 pounds, relatively unchanged from last year though up 15 pounds from end of 2023  Labs reviewed A1c 7.2 Total chol 166 LDL 86  EKG personally reviewed by myself on todays visit EKG Interpretation Date/Time:  Tuesday February 02 2024 10:39:26 EDT Ventricular Rate:  83 PR Interval:  164 QRS Duration:  78 QT Interval:  402 QTC Calculation: 472 R Axis:   24  Text Interpretation: Normal sinus rhythm Nonspecific ST and T wave abnormality When compared with ECG of 08-Mar-2022 09:55, Sinus rhythm has replaced Atrial fibrillation Vent. rate has decreased BY  49 BPM ST no longer depressed in Inferior leads Nonspecific T wave abnormality, improved in Inferior leads Confirmed by Perla Lye [phone]) on  02/02/2024 10:51:35 AM    Other past medical history reviewed Seen in the emergency room March 08, 2022 for atrial fibrillation In the setting of stress concern for her heart racing chest pain and shortness of breath.  Patient reports of increasing fatigue and just not feeling her normal self for the past week and then this morning she woke up with her heart rate beating out of her chest.   Was seen by outside cardiology, reports that echocardiogram was done, stress test done, ABIs completed  GI upset on metformin   PMH:   has a past medical history of Allergy, Asthma, GERD (gastroesophageal reflux disease), History of kidney stones, Hyperlipidemia, Hypertension, and Obesity.  PSH:    Past Surgical History:  Procedure Laterality Date   ABDOMINAL HYSTERECTOMY     COLONOSCOPY WITH PROPOFOL  N/A 04/26/2019   Procedure: COLONOSCOPY WITH PROPOFOL ;  Surgeon: Therisa Bi, MD;  Location: [facility] ENDOSCOPY;  Service: Gastroenterology;  Laterality: N/A;   LITHOTRIPSY      TIMES 2 1990    Current Outpatient Medications  Medication Sig Dispense Refill   albuterol  (VENTOLIN  HFA) 108 (90 Base) MCG/ACT inhaler Inhale 2 puffs into the lungs every 6 (six) hours as needed for wheezing or shortness of breath. 1 each 2   amiodarone  (PACERONE ) 200 MG tablet TAKE 1 TABLET BY MOUTH DAILY 90 tablet 3   amLODipine  (NORVASC ) 5 MG tablet Take 5 mg by mouth daily.     atorvastatin  (LIPITOR) 10 MG tablet Take 1 tablet (10 mg total) by mouth daily. 60 tablet 0   cetirizine (ZYRTEC) 10 MG  tablet Take 10 mg daily by mouth.     cholecalciferol (VITAMIN D ) 1000 UNITS tablet Take 1,000 Units by mouth daily.     ELIQUIS  5 MG TABS tablet TAKE 1 TABLET BY MOUTH TWICE A DAY 180 tablet 1   esomeprazole (NEXIUM) 40 MG capsule Take 40 mg by mouth daily as needed.     glipiZIDE (GLUCOTROL XL) 2.5 MG 24 hr tablet Take 1 tablet by mouth daily.     hydrochlorothiazide  (HYDRODIURIL ) 25 MG tablet Take 1 tablet (25 mg total) by mouth  daily. 60 tablet 0   losartan  (COZAAR ) 100 MG tablet TAKE 1 TABLET BY MOUTH DAILY 90 tablet 3   methocarbamol (ROBAXIN) 500 MG tablet Take 500 mg by mouth every 6 (six) hours as needed.     metoprolol  succinate (TOPROL -XL) 50 MG 24 hr tablet TAKE 1 TABLET BY MOUTH DAILY 90 tablet 1   cyclobenzaprine  (FLEXERIL ) 5 MG tablet Take 1 tablet (5 mg total) by mouth 3 (three) times daily as needed for muscle spasms. (Patient not taking: Reported on 02/02/2024) 30 tablet 0   minoxidil (LONITEN) 2.5 MG tablet Take 2.5 mg by mouth. (Patient not taking: Reported on 02/02/2024)     Current Facility-Administered Medications  Medication Dose Route Frequency Provider Last Rate Last Admin   albuterol  (PROVENTIL ) (2.5 MG/3ML) 0.083% nebulizer solution 2.5 mg  2.5 mg Nebulization Once Arnett, Margaret G, FNP       Allergies:   Codeine, Ace inhibitors, and Estradiol   Social History:  The patient  reports that she has never smoked. She has never used smokeless tobacco. She reports that she does not currently use alcohol. She reports that she does not use drugs.   Family History:   family history includes Breast cancer in her maternal aunt; Coronary artery disease in her brother and mother; Diabetes in her mother; Heart attack in her brother; Heart disease in her mother; Liver cancer in her brother.    Review of Systems: Review of Systems  Constitutional: Negative.   HENT: Negative.    Respiratory:  Positive for shortness of breath.   Cardiovascular: Negative.   Gastrointestinal: Negative.   Musculoskeletal: Negative.   Neurological: Negative.   Psychiatric/Behavioral: Negative.    All other systems reviewed and are negative.   PHYSICAL EXAM: VS:  BP (!) 140/76   Pulse 83   Ht 5' 1 (1.549 m)   Wt 177 lb 3.2 oz (80.4 kg)   SpO2 96%   BMI 33.48 kg/m  , BMI Body mass index is 33.48 kg/m. Constitutional:  oriented to person, place, and time. No distress.  HENT:  Head: Grossly normal Eyes:  no  discharge. No scleral icterus.  Neck: No JVD, no carotid bruits  Cardiovascular: Regular rate and rhythm, no murmurs appreciated Pulmonary/Chest: Clear to auscultation bilaterally, no wheezes or rails Abdominal: Soft.  no distension.  no tenderness.  Musculoskeletal: Normal range of motion Neurological:  normal muscle tone. Coordination normal. No atrophy Skin: Skin warm and dry Psychiatric: normal affect, pleasant  Recent Labs: No results found for requested labs within last 365 days.    Lipid Panel Lab Results  Component Value Date   CHOL 174 12/27/2021   HDL 53.70 12/27/2021   LDLCALC 90 12/27/2021   TRIG 153.0 (H) 12/27/2021      Wt Readings from Last 3 Encounters:  02/02/24 177 lb 3.2 oz (80.4 kg)  01/05/23 175 lb 8 oz (79.6 kg)  07/30/22 167 lb 6 oz (75.9 kg)  ASSESSMENT AND PLAN:  Problem List Items Addressed This Visit       Cardiology Problems   Essential hypertension - Primary (Chronic)   Relevant Orders   EKG 12-Lead (Completed)   Paroxysmal atrial fibrillation (HCC) (Chronic)   Relevant Orders   EKG 12-Lead (Completed)  Paroxysmal atrial fibrillation Denies having significant episodes  stay on metoprolol  succinate 50 daily, Eliquis , amiodarone   Surveillance labs performed by primary care, stable  Coronary artery calcification Significant calcifications In the setting of diabetes and hyperlipidemia Previously noted on CT scan in 2018, score 500 Recommend she add Zetia 10 mg daily with her Lipitor 10 Recommended if shortness of breath symptoms get worse, may need ischemic workup with stress testing or cardiac catheterization  Hyperlipidemia Cholesterol above goal, continue Lipitor 10 Will not increase the Lipitor as she is having some myalgias in the legs Add zetia 10 daily  Essential hypertension Blood pressure is well controlled on today's visit. No changes made to the medications.  Obesity Consider Ozempic in the setting of  diabetes    Signed, Velinda Lunger, M.D., Ph.D. [facility] [place], [place] [phone]

## 2024-02-02 NOTE — Patient Instructions (Addendum)
 Medication Instructions:   Please add zetia 10 mg daily  If you need a refill on your cardiac medications before your next appointment, please call your pharmacy.   Lab work: No new labs needed  Testing/Procedures:  Your physician has requested that you have an echocardiogram. Echocardiography is a painless test that uses sound waves to create images of your heart. It provides your doctor with information about the size and shape of your heart and how well your heart's chambers and valves are working.   You may receive an ultrasound enhancing agent through an IV if needed to better visualize your heart during the echo. This procedure takes approximately one hour.  There are no restrictions for this procedure.  This will take place at [address] [facility] [address], [place] [address]  Please note: We ask at that you not bring children with you during ultrasound (echo/ vascular) testing. Due to room size and safety concerns, children are not allowed in the ultrasound rooms during exams. Our front office staff cannot provide observation of children in our lobby area while testing is being conducted. An adult accompanying a patient to their appointment will only be allowed in the ultrasound room at the discretion of the ultrasound technician under special circumstances. We apologize for any inconvenience.   Follow-Up: At [facility], you and your health needs are our priority.  As part of our continuing mission to provide you with exceptional heart care, we have created designated Provider Care Teams.  These Care Teams include your primary Cardiologist (physician) and Advanced Practice Providers (APPs -  Physician Assistants and Nurse Practitioners) who all work together to provide you with the care you need, when you need it.  You will need a follow up appointment in 6 months  Providers on your designated Care Team:   Lonni Meager, NP Bernardino Bring, PA-C Cadence Franchester,  [place]  COVID-19 Vaccine Information can be found at: [email or web address] For questions related to vaccine distribution or appointments, please email [email or web address] or call [phone].

## 2024-02-03 ENCOUNTER — Ambulatory Visit: Attending: Cardiovascular Disease

## 2024-02-03 DIAGNOSIS — R0602 Shortness of breath: Secondary | ICD-10-CM

## 2024-02-03 DIAGNOSIS — R399 Unspecified symptoms and signs involving the genitourinary system: Secondary | ICD-10-CM | POA: Diagnosis not present

## 2024-02-03 DIAGNOSIS — R8290 Unspecified abnormal findings in urine: Secondary | ICD-10-CM | POA: Diagnosis not present

## 2024-02-03 DIAGNOSIS — R300 Dysuria: Secondary | ICD-10-CM | POA: Diagnosis not present

## 2024-02-03 LAB — ECHOCARDIOGRAM COMPLETE
AV Mean grad: 2 mmHg
AV Peak grad: 4.5 mmHg
Ao pk vel: 1.06 m/s
Area-P 1/2: 3.72 cm2

## 2024-02-07 ENCOUNTER — Ambulatory Visit: Payer: Self-pay | Admitting: Cardiovascular Disease

## 2024-04-08 DIAGNOSIS — L565 Disseminated superficial actinic porokeratosis (DSAP): Secondary | ICD-10-CM | POA: Diagnosis not present

## 2024-04-08 DIAGNOSIS — D2261 Melanocytic nevi of right upper limb, including shoulder: Secondary | ICD-10-CM | POA: Diagnosis not present

## 2024-04-08 DIAGNOSIS — L658 Other specified nonscarring hair loss: Secondary | ICD-10-CM | POA: Diagnosis not present

## 2024-04-08 DIAGNOSIS — L821 Other seborrheic keratosis: Secondary | ICD-10-CM | POA: Diagnosis not present

## 2024-04-08 DIAGNOSIS — D2272 Melanocytic nevi of left lower limb, including hip: Secondary | ICD-10-CM | POA: Diagnosis not present

## 2024-04-08 DIAGNOSIS — D225 Melanocytic nevi of trunk: Secondary | ICD-10-CM | POA: Diagnosis not present

## 2024-04-08 DIAGNOSIS — D2262 Melanocytic nevi of left upper limb, including shoulder: Secondary | ICD-10-CM | POA: Diagnosis not present

## 2024-04-08 DIAGNOSIS — D2271 Melanocytic nevi of right lower limb, including hip: Secondary | ICD-10-CM | POA: Diagnosis not present

## 2024-06-24 NOTE — Progress Notes (Addendum)
 SANDY HAYE                                          MRN: [id number]   06/24/2024   The [facility] Quality Team Specialist reviewed this patient medical record for the purposes of chart review for care gap closure. The following were reviewed: chart review for care gap closure-controlling blood pressure.  08/09/2024- NO CBP TO CLOSE FOR 2025   [facility] Quality Team

## 2024-09-05 ENCOUNTER — Other Ambulatory Visit: Payer: Self-pay | Admitting: Cardiovascular Disease

## 2024-09-05 DIAGNOSIS — I480 Paroxysmal atrial fibrillation: Secondary | ICD-10-CM

## 2024-09-06 ENCOUNTER — Ambulatory Visit: Admitting: Cardiology

## 2024-09-13 ENCOUNTER — Ambulatory Visit: Admitting: Cardiology
